# Patient Record
Sex: Female | Born: 1935 | Race: White | Hispanic: No | Smoking: Never smoker
Health system: Southern US, Community
[De-identification: ages and names within clinical notes are randomized; demographics above are authoritative.]

## PROBLEM LIST (undated history)

## (undated) DIAGNOSIS — E785 Hyperlipidemia, unspecified: Secondary | ICD-10-CM

## (undated) DIAGNOSIS — D509 Iron deficiency anemia, unspecified: Secondary | ICD-10-CM

## (undated) DIAGNOSIS — B029 Zoster without complications: Secondary | ICD-10-CM

## (undated) DIAGNOSIS — K589 Irritable bowel syndrome without diarrhea: Secondary | ICD-10-CM

## (undated) DIAGNOSIS — F419 Anxiety disorder, unspecified: Secondary | ICD-10-CM

## (undated) DIAGNOSIS — I10 Essential (primary) hypertension: Secondary | ICD-10-CM

## (undated) DIAGNOSIS — R55 Syncope and collapse: Secondary | ICD-10-CM

## (undated) DIAGNOSIS — I2510 Atherosclerotic heart disease of native coronary artery without angina pectoris: Secondary | ICD-10-CM

## (undated) DIAGNOSIS — E7800 Pure hypercholesterolemia, unspecified: Secondary | ICD-10-CM

## (undated) HISTORY — DX: Irritable bowel syndrome, unspecified: K58.9

## (undated) HISTORY — DX: Hyperlipidemia, unspecified: E78.5

## (undated) HISTORY — DX: Syncope and collapse: R55

## (undated) HISTORY — DX: Iron deficiency anemia, unspecified: D50.9

## (undated) HISTORY — DX: Essential (primary) hypertension: I10

## (undated) HISTORY — DX: Pure hypercholesterolemia, unspecified: E78.00

## (undated) HISTORY — DX: Anxiety disorder, unspecified: F41.9

---
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
## 1965-10-14 HISTORY — PX: OOPHORECTOMY: SHX86

## 1965-10-14 HISTORY — PX: APPENDECTOMY: SHX54

## 1994-10-14 HISTORY — PX: CORONARY ARTERY BYPASS GRAFT: SHX141

## 1998-01-11 ENCOUNTER — Other Ambulatory Visit: Admission: RE | Admit: 1998-01-11 | Discharge: 1998-01-11 | Payer: Self-pay | Admitting: *Deleted

## 1998-05-10 ENCOUNTER — Other Ambulatory Visit: Admission: RE | Admit: 1998-05-10 | Discharge: 1998-05-10 | Payer: Self-pay | Admitting: Cardiology

## 1999-01-12 ENCOUNTER — Other Ambulatory Visit: Admission: RE | Admit: 1999-01-12 | Discharge: 1999-01-12 | Payer: Self-pay | Admitting: *Deleted

## 1999-09-17 ENCOUNTER — Emergency Department (HOSPITAL_COMMUNITY): Admission: EM | Admit: 1999-09-17 | Discharge: 1999-09-17 | Payer: Self-pay

## 1999-11-15 ENCOUNTER — Encounter: Payer: Self-pay | Admitting: Gastroenterology

## 1999-11-15 ENCOUNTER — Encounter: Admission: RE | Admit: 1999-11-15 | Discharge: 1999-11-15 | Payer: Self-pay | Admitting: Gastroenterology

## 2000-03-05 ENCOUNTER — Other Ambulatory Visit: Admission: RE | Admit: 2000-03-05 | Discharge: 2000-03-05 | Payer: Self-pay | Admitting: *Deleted

## 2000-03-12 ENCOUNTER — Encounter (INDEPENDENT_AMBULATORY_CARE_PROVIDER_SITE_OTHER): Payer: Self-pay | Admitting: Specialist

## 2000-07-15 ENCOUNTER — Ambulatory Visit: Admission: RE | Admit: 2000-07-15 | Discharge: 2000-07-15 | Payer: Self-pay | Admitting: Gynecology

## 2000-07-21 ENCOUNTER — Encounter: Payer: Self-pay | Admitting: Gynecology

## 2000-07-21 ENCOUNTER — Ambulatory Visit (HOSPITAL_COMMUNITY): Admission: RE | Admit: 2000-07-21 | Discharge: 2000-07-21 | Payer: Self-pay | Admitting: Gynecology

## 2000-08-15 ENCOUNTER — Encounter: Payer: Self-pay | Admitting: Gynecology

## 2000-08-19 ENCOUNTER — Ambulatory Visit (HOSPITAL_COMMUNITY): Admission: RE | Admit: 2000-08-19 | Discharge: 2000-08-19 | Payer: Self-pay | Admitting: Gynecology

## 2000-08-19 ENCOUNTER — Encounter (INDEPENDENT_AMBULATORY_CARE_PROVIDER_SITE_OTHER): Payer: Self-pay

## 2000-09-24 ENCOUNTER — Ambulatory Visit: Admission: RE | Admit: 2000-09-24 | Discharge: 2000-09-24 | Payer: Self-pay | Admitting: Gynecology

## 2001-03-04 ENCOUNTER — Encounter: Payer: Self-pay | Admitting: Gastroenterology

## 2001-03-04 ENCOUNTER — Encounter: Admission: RE | Admit: 2001-03-04 | Discharge: 2001-03-04 | Payer: Self-pay | Admitting: Gastroenterology

## 2001-03-05 ENCOUNTER — Other Ambulatory Visit: Admission: RE | Admit: 2001-03-05 | Discharge: 2001-03-05 | Payer: Self-pay | Admitting: *Deleted

## 2002-04-15 ENCOUNTER — Other Ambulatory Visit: Admission: RE | Admit: 2002-04-15 | Discharge: 2002-04-15 | Payer: Self-pay | Admitting: *Deleted

## 2004-08-02 ENCOUNTER — Other Ambulatory Visit: Admission: RE | Admit: 2004-08-02 | Discharge: 2004-08-02 | Payer: Self-pay | Admitting: *Deleted

## 2004-08-03 ENCOUNTER — Emergency Department (HOSPITAL_COMMUNITY): Admission: EM | Admit: 2004-08-03 | Discharge: 2004-08-03 | Payer: Self-pay | Admitting: Emergency Medicine

## 2004-08-17 ENCOUNTER — Encounter: Admission: RE | Admit: 2004-08-17 | Discharge: 2004-08-17 | Payer: Self-pay | Admitting: Otolaryngology

## 2004-10-14 LAB — HM COLONOSCOPY: HM Colonoscopy: NORMAL

## 2004-12-04 ENCOUNTER — Ambulatory Visit (HOSPITAL_COMMUNITY): Admission: RE | Admit: 2004-12-04 | Discharge: 2004-12-04 | Payer: Self-pay | Admitting: Otolaryngology

## 2004-12-07 ENCOUNTER — Ambulatory Visit (HOSPITAL_COMMUNITY): Admission: RE | Admit: 2004-12-07 | Discharge: 2004-12-07 | Payer: Self-pay | Admitting: Otolaryngology

## 2004-12-07 ENCOUNTER — Encounter (INDEPENDENT_AMBULATORY_CARE_PROVIDER_SITE_OTHER): Payer: Self-pay | Admitting: *Deleted

## 2006-12-09 ENCOUNTER — Other Ambulatory Visit: Admission: RE | Admit: 2006-12-09 | Discharge: 2006-12-09 | Payer: Self-pay | Admitting: *Deleted

## 2007-10-15 HISTORY — PX: EYE SURGERY: SHX253

## 2010-02-06 ENCOUNTER — Encounter: Admission: RE | Admit: 2010-02-06 | Discharge: 2010-02-06 | Payer: Self-pay | Admitting: Cardiology

## 2010-02-09 ENCOUNTER — Inpatient Hospital Stay (HOSPITAL_COMMUNITY): Admission: RE | Admit: 2010-02-09 | Discharge: 2010-02-13 | Payer: Self-pay | Admitting: Cardiology

## 2010-02-09 HISTORY — PX: CARDIAC CATHETERIZATION: SHX172

## 2010-03-08 ENCOUNTER — Encounter (HOSPITAL_COMMUNITY): Admission: RE | Admit: 2010-03-08 | Discharge: 2010-06-06 | Payer: Self-pay | Admitting: Cardiology

## 2010-03-28 ENCOUNTER — Ambulatory Visit: Payer: Self-pay | Admitting: Cardiology

## 2010-03-28 ENCOUNTER — Inpatient Hospital Stay (HOSPITAL_COMMUNITY): Admission: EM | Admit: 2010-03-28 | Discharge: 2010-03-29 | Payer: Self-pay | Admitting: Emergency Medicine

## 2010-06-07 ENCOUNTER — Encounter (HOSPITAL_COMMUNITY): Admission: RE | Admit: 2010-06-07 | Discharge: 2010-06-15 | Payer: Self-pay | Admitting: Cardiology

## 2010-06-13 ENCOUNTER — Observation Stay (HOSPITAL_COMMUNITY): Admission: EM | Admit: 2010-06-13 | Discharge: 2010-06-14 | Payer: Self-pay | Admitting: Emergency Medicine

## 2010-06-13 ENCOUNTER — Ambulatory Visit: Payer: Self-pay | Admitting: Cardiology

## 2010-06-19 ENCOUNTER — Ambulatory Visit: Payer: Self-pay | Admitting: Cardiology

## 2010-06-27 ENCOUNTER — Ambulatory Visit: Payer: Self-pay | Admitting: Cardiology

## 2010-08-02 ENCOUNTER — Ambulatory Visit: Payer: Self-pay | Admitting: Cardiology

## 2010-08-03 ENCOUNTER — Ambulatory Visit: Payer: Self-pay | Admitting: Cardiology

## 2010-08-09 ENCOUNTER — Ambulatory Visit: Payer: Self-pay | Admitting: Cardiology

## 2010-09-24 ENCOUNTER — Ambulatory Visit: Payer: Self-pay | Admitting: Cardiology

## 2010-10-14 HISTORY — PX: SQUAMOUS CELL CARCINOMA EXCISION: SHX2433

## 2010-10-14 LAB — HM MAMMOGRAPHY: HM Mammogram: NORMAL

## 2010-10-16 ENCOUNTER — Ambulatory Visit: Payer: Self-pay | Admitting: Cardiology

## 2010-11-13 ENCOUNTER — Ambulatory Visit: Payer: Self-pay | Admitting: Cardiology

## 2010-12-20 ENCOUNTER — Other Ambulatory Visit: Payer: Self-pay | Admitting: Dermatology

## 2010-12-27 LAB — DIFFERENTIAL
Eosinophils Absolute: 0.3 10*3/uL (ref 0.0–0.7)
Lymphs Abs: 1.2 10*3/uL (ref 0.7–4.0)
Monocytes Relative: 13 % — ABNORMAL HIGH (ref 3–12)
Neutro Abs: 3.8 10*3/uL (ref 1.7–7.7)

## 2010-12-27 LAB — URINALYSIS, ROUTINE W REFLEX MICROSCOPIC
Glucose, UA: NEGATIVE mg/dL
Ketones, ur: NEGATIVE mg/dL
Protein, ur: NEGATIVE mg/dL
Specific Gravity, Urine: 1.014 (ref 1.005–1.030)
pH: 7.5 (ref 5.0–8.0)

## 2010-12-27 LAB — CBC
HCT: 33.3 % — ABNORMAL LOW (ref 36.0–46.0)
HCT: 33.4 % — ABNORMAL LOW (ref 36.0–46.0)
Hemoglobin: 10.2 g/dL — ABNORMAL LOW (ref 12.0–15.0)
Hemoglobin: 10.7 g/dL — ABNORMAL LOW (ref 12.0–15.0)
MCH: 27.1 pg (ref 26.0–34.0)
MCHC: 32.1 g/dL (ref 30.0–36.0)
MCV: 83.9 fL (ref 78.0–100.0)
Platelets: 229 10*3/uL (ref 150–400)
Platelets: 236 10*3/uL (ref 150–400)
RBC: 3.79 MIL/uL — ABNORMAL LOW (ref 3.87–5.11)
RBC: 3.98 MIL/uL (ref 3.87–5.11)
RDW: 13.5 % (ref 11.5–15.5)
WBC: 4.7 10*3/uL (ref 4.0–10.5)
WBC: 6 10*3/uL (ref 4.0–10.5)

## 2010-12-27 LAB — BASIC METABOLIC PANEL
BUN: 13 mg/dL (ref 6–23)
CO2: 29 mEq/L (ref 19–32)
Calcium: 8.1 mg/dL — ABNORMAL LOW (ref 8.4–10.5)
Chloride: 107 mEq/L (ref 96–112)
Creatinine, Ser: 0.54 mg/dL (ref 0.4–1.2)
Creatinine, Ser: 0.57 mg/dL (ref 0.4–1.2)
GFR calc Af Amer: >60 mL/min (ref >60)
GFR calc non Af Amer: >60 mL/min (ref >60)
Sodium: 132 mEq/L — ABNORMAL LOW (ref 135–145)
Sodium: 139 mEq/L (ref 135–145)

## 2010-12-27 LAB — CARDIAC PANEL(CRET KIN+CKTOT+MB+TROPI)
CK, MB: 1.4 ng/mL (ref 0.3–4.0)
Relative Index: INVALID (ref 0.0–2.5)
Relative Index: INVALID (ref 0.0–2.5)
Total CK: 44 U/L (ref 7–177)
Total CK: 46 U/L (ref 7–177)

## 2010-12-27 LAB — URINE CULTURE
Colony Count: NO GROWTH
Culture: NO GROWTH

## 2010-12-27 LAB — POCT I-STAT, CHEM 8
BUN: 15 mg/dL (ref 6–23)
Creatinine, Ser: 0.7 mg/dL (ref 0.4–1.2)
Hemoglobin: 12.9 g/dL (ref 12.0–15.0)
Potassium: 3.8 mEq/L (ref 3.5–5.1)
Sodium: 132 mEq/L — ABNORMAL LOW (ref 135–145)
TCO2: 29 mmol/L (ref 0–100)

## 2010-12-27 LAB — COMPREHENSIVE METABOLIC PANEL
AST: 22 U/L (ref 0–37)
BUN: 12 mg/dL (ref 6–23)
CO2: 31 mEq/L (ref 19–32)
Calcium: 8.2 mg/dL — ABNORMAL LOW (ref 8.4–10.5)
Creatinine, Ser: 0.7 mg/dL (ref 0.4–1.2)
Glucose, Bld: 123 mg/dL — ABNORMAL HIGH (ref 70–99)

## 2010-12-27 LAB — FOLATE: Folate: >20 ng/mL

## 2010-12-27 LAB — IRON AND TIBC
Saturation Ratios: 5 % — ABNORMAL LOW (ref 20–55)
UIBC: 378 ug/dL

## 2010-12-27 LAB — POCT CARDIAC MARKERS

## 2010-12-27 LAB — RETICULOCYTES: Retic Ct Pct: 1.7 % (ref 0.4–3.1)

## 2010-12-30 LAB — COMPREHENSIVE METABOLIC PANEL
ALT: 15 U/L (ref 0–35)
Albumin: 3.1 g/dL — ABNORMAL LOW (ref 3.5–5.2)
Alkaline Phosphatase: 41 U/L (ref 39–117)
BUN: 7 mg/dL (ref 6–23)
Chloride: 98 mEq/L (ref 96–112)
Glucose, Bld: 119 mg/dL — ABNORMAL HIGH (ref 70–99)
Potassium: 3.7 mEq/L (ref 3.5–5.1)
Sodium: 135 mEq/L (ref 135–145)
Total Bilirubin: 0.4 mg/dL (ref 0.3–1.2)
Total Protein: 5.1 g/dL — ABNORMAL LOW (ref 6.0–8.3)

## 2010-12-30 LAB — CARDIAC PANEL(CRET KIN+CKTOT+MB+TROPI)
CK, MB: 1.8 ng/mL (ref 0.3–4.0)
Relative Index: INVALID (ref 0.0–2.5)
Total CK: 52 U/L (ref 7–177)
Troponin I: 0.01 ng/mL (ref 0.00–0.06)

## 2010-12-30 LAB — CBC
HCT: 33.9 % — ABNORMAL LOW (ref 36.0–46.0)
HCT: 34.6 % — ABNORMAL LOW (ref 36.0–46.0)
Hemoglobin: 11.7 g/dL — ABNORMAL LOW (ref 12.0–15.0)
MCHC: 33.6 g/dL (ref 30.0–36.0)
MCHC: 34 g/dL (ref 30.0–36.0)
MCV: 91.8 fL (ref 78.0–100.0)
MCV: 92.1 fL (ref 78.0–100.0)
RBC: 3.68 MIL/uL — ABNORMAL LOW (ref 3.87–5.11)
RBC: 3.78 MIL/uL — ABNORMAL LOW (ref 3.87–5.11)
WBC: 4.7 10*3/uL (ref 4.0–10.5)
WBC: 5 10*3/uL (ref 4.0–10.5)

## 2010-12-30 LAB — BASIC METABOLIC PANEL
BUN: 5 mg/dL — ABNORMAL LOW (ref 6–23)
Chloride: 100 mEq/L (ref 96–112)
GFR calc non Af Amer: >60 mL/min (ref >60)
Glucose, Bld: 103 mg/dL — ABNORMAL HIGH (ref 70–99)
Potassium: 3.7 mEq/L (ref 3.5–5.1)
Sodium: 133 mEq/L — ABNORMAL LOW (ref 135–145)

## 2010-12-30 LAB — HEPARIN LEVEL (UNFRACTIONATED): Heparin Unfractionated: 0.43 IU/mL (ref 0.30–0.70)

## 2010-12-31 LAB — POCT I-STAT, CHEM 8
Calcium, Ion: 1.02 mmol/L — ABNORMAL LOW (ref 1.12–1.32)
Chloride: 97 mEq/L (ref 96–112)
Glucose, Bld: 112 mg/dL — ABNORMAL HIGH (ref 70–99)
HCT: 36 % (ref 36.0–46.0)
Hemoglobin: 12.2 g/dL (ref 12.0–15.0)
TCO2: 26 mmol/L (ref 0–100)

## 2010-12-31 LAB — CBC
HCT: 34.8 % — ABNORMAL LOW (ref 36.0–46.0)
MCV: 92.2 fL (ref 78.0–100.0)
Platelets: 176 10*3/uL (ref 150–400)
RDW: 12.6 % (ref 11.5–15.5)

## 2010-12-31 LAB — DIFFERENTIAL
Basophils Absolute: 0 10*3/uL (ref 0.0–0.1)
Basophils Relative: 1 % (ref 0–1)
Eosinophils Absolute: 0.2 10*3/uL (ref 0.0–0.7)
Eosinophils Relative: 3 % (ref 0–5)
Neutrophils Relative %: 67 % (ref 43–77)

## 2010-12-31 LAB — PROTIME-INR: Prothrombin Time: 13 seconds (ref 11.6–15.2)

## 2010-12-31 LAB — POCT CARDIAC MARKERS: Troponin i, poc: <0.05 ng/mL (ref 0.00–0.09)

## 2011-01-01 LAB — BASIC METABOLIC PANEL
BUN: 9 mg/dL (ref 6–23)
BUN: 8 mg/dL (ref 6–23)
Creatinine, Ser: 0.63 mg/dL (ref 0.4–1.2)
GFR calc non Af Amer: >60 mL/min (ref >60)
GFR calc non Af Amer: >60 mL/min (ref >60)
Glucose, Bld: 100 mg/dL — ABNORMAL HIGH (ref 70–99)
Potassium: 4 mEq/L (ref 3.5–5.1)
Potassium: 4.3 mEq/L (ref 3.5–5.1)

## 2011-01-01 LAB — CBC
HCT: 34.1 % — ABNORMAL LOW (ref 36.0–46.0)
HCT: 35.1 % — ABNORMAL LOW (ref 36.0–46.0)
Hemoglobin: 11.9 g/dL — ABNORMAL LOW (ref 12.0–15.0)
MCV: 94.5 fL (ref 78.0–100.0)
MCV: 94.8 fL (ref 78.0–100.0)
Platelets: 165 10*3/uL (ref 150–400)
Platelets: 161 10*3/uL (ref 150–400)
RDW: 12.4 % (ref 11.5–15.5)
RDW: 12.6 % (ref 11.5–15.5)
WBC: 5.8 10*3/uL (ref 4.0–10.5)

## 2011-01-01 LAB — PROTIME-INR
INR: 0.99 (ref 0.00–1.49)
Prothrombin Time: 13 seconds (ref 11.6–15.2)

## 2011-01-03 ENCOUNTER — Telehealth: Payer: Self-pay | Admitting: *Deleted

## 2011-01-03 NOTE — Telephone Encounter (Signed)
PATIENT PHONED C/O A LOT OF BELCHING, CHEST DISCOMFORT, DIZZY FEELING YESTERDAY LIKE SHE WAS "GOING TO FLY OUT OF A FERRIS WHEEL".  TOOK A NTG WITH NO RELIEF.  NORMAL CATH April 2011.  ADVISED TO REST FOR THE DAY AND TRY SOME ANTACIDS.  CALL BACK IN AM IF NO BETTER. ADVISE TO CALL 911 IF NAUSEA, ARM PAIN, OR SOB.

## 2011-01-04 NOTE — Telephone Encounter (Signed)
Agree 

## 2011-01-14 ENCOUNTER — Other Ambulatory Visit: Payer: Self-pay | Admitting: Cardiology

## 2011-01-14 DIAGNOSIS — M62830 Muscle spasm of back: Secondary | ICD-10-CM

## 2011-01-16 NOTE — Telephone Encounter (Signed)
I do prescribe this

## 2011-02-02 ENCOUNTER — Encounter: Payer: Self-pay | Admitting: Cardiology

## 2011-02-04 ENCOUNTER — Encounter: Payer: Self-pay | Admitting: Cardiology

## 2011-02-04 DIAGNOSIS — R5383 Other fatigue: Secondary | ICD-10-CM | POA: Insufficient documentation

## 2011-02-04 DIAGNOSIS — D509 Iron deficiency anemia, unspecified: Secondary | ICD-10-CM | POA: Insufficient documentation

## 2011-02-04 DIAGNOSIS — R9439 Abnormal result of other cardiovascular function study: Secondary | ICD-10-CM | POA: Insufficient documentation

## 2011-02-04 DIAGNOSIS — I10 Essential (primary) hypertension: Secondary | ICD-10-CM | POA: Insufficient documentation

## 2011-02-04 DIAGNOSIS — E785 Hyperlipidemia, unspecified: Secondary | ICD-10-CM | POA: Insufficient documentation

## 2011-02-04 DIAGNOSIS — K589 Irritable bowel syndrome without diarrhea: Secondary | ICD-10-CM | POA: Insufficient documentation

## 2011-02-04 DIAGNOSIS — F419 Anxiety disorder, unspecified: Secondary | ICD-10-CM | POA: Insufficient documentation

## 2011-02-04 DIAGNOSIS — I259 Chronic ischemic heart disease, unspecified: Secondary | ICD-10-CM | POA: Insufficient documentation

## 2011-02-04 DIAGNOSIS — R55 Syncope and collapse: Secondary | ICD-10-CM | POA: Insufficient documentation

## 2011-02-05 ENCOUNTER — Ambulatory Visit: Payer: Self-pay | Admitting: Cardiology

## 2011-02-11 ENCOUNTER — Ambulatory Visit (INDEPENDENT_AMBULATORY_CARE_PROVIDER_SITE_OTHER): Payer: Medicare Other | Admitting: Cardiology

## 2011-02-11 ENCOUNTER — Encounter: Payer: Self-pay | Admitting: Cardiology

## 2011-02-11 DIAGNOSIS — M797 Fibromyalgia: Secondary | ICD-10-CM

## 2011-02-11 DIAGNOSIS — Z951 Presence of aortocoronary bypass graft: Secondary | ICD-10-CM | POA: Insufficient documentation

## 2011-02-11 DIAGNOSIS — IMO0001 Reserved for inherently not codable concepts without codable children: Secondary | ICD-10-CM

## 2011-02-11 DIAGNOSIS — E7800 Pure hypercholesterolemia, unspecified: Secondary | ICD-10-CM

## 2011-02-11 DIAGNOSIS — Z9889 Other specified postprocedural states: Secondary | ICD-10-CM

## 2011-02-11 NOTE — Assessment & Plan Note (Addendum)
The patient has a remote history of fibromyalgia which over the course of many years appeared to have resolved.  Now she wonders if the fibromyalgia may be coming back again.  She complains of marked fatigue as well as generalized muscle aching and soreness.  She isNot able to do much walking because of the exhaustion.  She was asking about possible rheumatology referral.  She does have an appointment to see her new primary care provider Dr. Caryl Never within the next week or so and I asked her to discuss that with him.  We talked about possible empiric use of some of the newer drugs such as Lyrica which she had heard about but we did not start her on anything today.

## 2011-02-11 NOTE — Assessment & Plan Note (Signed)
The patient has not been experiencing any recurrent angina pectoris. 

## 2011-02-11 NOTE — Progress Notes (Signed)
Sabrina Moore Date of Birth:  02-26-1927 [facility] [address]   [address] [place], [place]  [address] [phone]           Fax   [phone]  History of Present Illness: This 77 year old woman is seen for a scheduled 3 month followup office visit.  She has a history of known ischemic heart disease.  She had coronary artery bypass graft surgery in 1996.  He had a abnormal treadmill Cardiolite stress test on 02/01/10 and had subsequent cardiac catheterization by Dr. Deborah Chalk and underwent PCI on 02/12/10 at which time she had successful stenting of the saphenous vein graft to the diagonal vessel which was also followed by successful stenting of the saphenous vein graft to the intermediate vessel.  Shortly after going home from that procedure she was readmitted to  in June 2011 for chest pain and she ruled out for myocardial infarction and did not require subsequent catheterization.  2 months later in August 2011 she had an episode of syncope at the cardiac rehabilitation program and was hospitalized.  Workup there was unremarkable.  She did seem to respond to low dose antianxiety medication in the form of Xanax.  She has a past history of suspected mitral valve prolapse and her last echocardiogram 03/18/05 showed sclerotic mitral valve leaflets with mild mitral valve prolapse and mild mitral regurgitation.  Patient has a past history of iron deficiency anemia and is followed by Dr. Dorena Cookey for GI.  Current Outpatient Prescriptions  Medication Sig Dispense Refill  . ALPHA LIPOIC ACID PO Take by mouth daily.        Marland Kitchen ALPRAZolam (XANAX) 0.25 MG tablet Take 0.25 mg by mouth 2 (two) times daily.        Marland Kitchen aspirin 81 MG tablet Take 81 mg by mouth daily.        . Calcium Carbonate-Vit D-Min (CALCIUM 1200 PO) Take by mouth daily.        . carisoprodol (SOMA) 350 MG tablet TAKE 1 TABLET BY MOUTH EVERY 6 HOURS AS NEEDED FOR BACK SPASM  30 tablet  2  . clopidogrel  (PLAVIX) 75 MG tablet Take 75 mg by mouth daily.        . Coenzyme Q10 (COQ10 PO) Take by mouth daily.        . Cyanocobalamin (B-12 PO) Take by mouth daily.        . Digestive Enzymes (ENZYME DIGEST PO) Take by mouth daily.        . ferrous sulfate 325 (65 FE) MG tablet Take 325 mg by mouth daily with breakfast.        . MAGNESIUM CITRATE PO Take by mouth daily.        . metoprolol succinate (TOPROL-XL) 25 MG 24 hr tablet Take 12.5 mg by mouth daily.        . mometasone (NASONEX) 50 MCG/ACT nasal spray 2 sprays by Nasal route as needed.        . Multiple Vitamin (MULTIVITAMIN) tablet Take 1 tablet by mouth daily.        . NYSTATIN PO Take by mouth. 5cc qid       . Omega-3 Fatty Acids (FISH OIL PO) Take by mouth daily.        Marland Kitchen POTASSIUM & SODIUM PHOSPHATES PO Take 45 mg by mouth daily.        . Probiotic Product (PROBIOTIC PO) Take by mouth daily.        Marland Kitchen  Psyllium (METAMUCIL FIBER SINGLES PO) Take by mouth daily.        . Pyridoxine HCl (B-6 PO) Take by mouth daily.          Allergies  Allergen Reactions  . Crestor (Rosuvastatin Calcium)   . Lescol     Gi symptoms  . Lipitor (Atorvastatin Calcium)   . Lovastatin   . Pravachol   . Zocor (Simvastatin)     Patient Active Problem List  Diagnoses  . IHD (ischemic heart disease)  . Chest discomfort  . Abnormal stress ECG with treadmill  . Dyslipidemia  . Vasovagal syncope  . IDA (iron deficiency anemia)  . IBS (irritable bowel syndrome)  . Fatigue  . Anxiety  . Hypertension  . Hypercholesterolemia  . Hx of CABG  . Fibromyalgia    History  Smoking status  . Never Smoker   Smokeless tobacco  . Not on file    History  Alcohol Use No    Family History  Problem Relation Age of Onset  . Breast cancer Mother   . Ovarian cancer Mother   . Heart disease Father   . Arthritis Father     Review of Systems: Constitutional: no fever chills diaphoresis or fatigue or change in weight.  Head and neck: no hearing loss, no  epistaxis, no photophobia or visual disturbance. Respiratory: No cough, shortness of breath or wheezing. Cardiovascular: No chest pain peripheral edema, palpitations. Gastrointestinal: No abdominal distention, no abdominal pain, no change in bowel habits hematochezia or melena. Genitourinary: No dysuria, no frequency, no urgency, no nocturia. Musculoskeletal:No arthralgias, no back pain, no gait disturbance . Neurological: No dizziness, no headaches, no numbness, no seizures, no syncope, no weakness, no tremors. Hematologic: No lymphadenopathy, no easy bruising. Psychiatric: No confusion, no hallucinations, no sleep disturbance.    Physical Exam: Filed Vitals:   02/11/11 1440  BP: 120/52  Pulse: 68  The general appearance reveals a very pleasant elderly woman in no acute distress.Pupils equal and reactive.   Extraocular Movements are full.  There is no scleral icterus.  The mouth and pharynx are normal.  The neck is supple.  The carotids reveal no bruits.  The jugular venous pressure is normal.  The thyroid is not enlarged.  There is no lymphadenopathy.The chest is clear to percussion and auscultation. There are no rales or rhonchi. Expansion of the chest is symmetrical.The precordium is quiet.  The first heart sound is normal.  The second heart sound is physiologically split.  There is no murmur gallop rub or click.  There is no abnormal lift or heave.The abdomen is soft and nontender. Bowel sounds are normal. The liver and spleen are not enlarged. There Are no abdominal masses. There are no bruits.The pedal pulses are good.  There is no phlebitis .There is trace bilateral ankle edema worse on the left.  There is no cyanosis or clubbing.Strength is normal and symmetrical in all extremities.  There is no lateralizing weakness.  There are no sensory deficits.The skin is warm and dry.  There is no rash.   Assessment / Plan: The patient is to continue her present medication.  She does feel that she  is slowly getting better and her strength is gradually improving.  We will not make any medication changes at this time.  She rechecked in 3 months for followup office visit and fasting lab work.

## 2011-02-11 NOTE — Assessment & Plan Note (Addendum)
The patient is trying to manage her cholesterol withDiet alone since she does not tolerate statins.  She has attributed some of her myalgias and possibly her fibromyalgia to previous statin use.  We talked about the fact that there really is not a blood test for fibromyalgia so there is no way to know for sure if she has a recurrence of it or not.

## 2011-02-12 ENCOUNTER — Ambulatory Visit (INDEPENDENT_AMBULATORY_CARE_PROVIDER_SITE_OTHER): Payer: Medicare Other | Admitting: Family Medicine

## 2011-02-12 ENCOUNTER — Encounter: Payer: Self-pay | Admitting: Family Medicine

## 2011-02-12 VITALS — BP 120/60 | HR 80 | Temp 98.3°F | Resp 12 | Ht 60.5 in | Wt 116.0 lb

## 2011-02-12 DIAGNOSIS — M791 Myalgia, unspecified site: Secondary | ICD-10-CM

## 2011-02-12 DIAGNOSIS — D649 Anemia, unspecified: Secondary | ICD-10-CM | POA: Insufficient documentation

## 2011-02-12 DIAGNOSIS — IMO0001 Reserved for inherently not codable concepts without codable children: Secondary | ICD-10-CM

## 2011-02-12 DIAGNOSIS — I259 Chronic ischemic heart disease, unspecified: Secondary | ICD-10-CM

## 2011-02-12 DIAGNOSIS — E785 Hyperlipidemia, unspecified: Secondary | ICD-10-CM

## 2011-02-12 NOTE — Progress Notes (Signed)
  Subjective:    Patient ID: Sabrina Moore, female    DOB: [birth year], 77 y.o.   MRN: [id number]  HPI New patient to establish care. Patient has history of ischemic heart disease, dyslipidemia, questionable irritable bowel syndrome, chronic fatigue, hypertension and question of fibromyalgia. She had coronary artery bypass graft 1996 and had reportedly couple of stents last summer. Medications are reviewed. She gives history that she had onset last fall of recurrent myalgias and arthralgias mostly in hips and back with some arm involvement as well. Increased fatigue. Symptoms have continued since that time. Recent lab work per gynecologist including thyroid functions and vitamin D level were normal.  ?History of fibromyalgia.   She also has history of chronic normocytic anemia with normal iron studies last summer. Not clear if polymyalgia rheumatica has been ruled out. No record of recent sedimentation rate. She has taken muscle relaxer and seen chiropractor without much relief of her muscle discomfort. Previous intolerance to multiple statins and currently not using any statins. Denies recent chest pain.   Review of Systems  Constitutional: Positive for fatigue. Negative for fever, chills, activity change, appetite change and unexpected weight change.  HENT: Negative for trouble swallowing.   Eyes: Negative for visual disturbance.  Respiratory: Negative for cough, shortness of breath and wheezing.   Cardiovascular: Negative for chest pain, palpitations and leg swelling.  Gastrointestinal: Negative for abdominal pain and blood in stool.  Musculoskeletal: Positive for myalgias and back pain. Negative for joint swelling.  Skin: Negative for rash.  Neurological: Positive for weakness. Negative for syncope and headaches.       Weakness is generalized.  Hematological: Negative for adenopathy.  Psychiatric/Behavioral: Negative for dysphoric mood.       Objective:   Physical Exam  Constitutional:  She is oriented to person, place, and time. She appears well-developed and well-nourished. No distress.  HENT:  Mouth/Throat: Oropharynx is clear and moist.  Neck: No thyromegaly present.  Cardiovascular: Normal rate, regular rhythm and normal heart sounds.   No murmur heard. Pulmonary/Chest: Effort normal and breath sounds normal. No respiratory distress. She has no wheezes.  Musculoskeletal: She exhibits no edema.  Lymphadenopathy:    She has no cervical adenopathy.  Neurological: She is alert and oriented to person, place, and time. No cranial nerve deficit. She exhibits normal muscle tone.          Assessment & Plan:  #1 myalgias and arthralgias. Rule out polymyalgia rheumatica. Obtain sed rate. If elevated consider low-dose prednisone.  If normal suspect possible fibromyalgia related. #2 ischemic heart disease. No recent chest pains #3 hyperlipidemia. Patient intolerant to basically all statins. #4 history of normocytic anemia which is chronic

## 2011-02-12 NOTE — Patient Instructions (Signed)
Polymyalgia Rheumatica Polymyalgia rheumatica (also called PMR or polymyalgia) is a rheumatologic (arthritic) condition that causes pain and morning stiffness in your neck, shoulders, and hips. It is an inflammatory condition. In some people, inflammation of certain structures in the shoulder, hips, or other joints can be seen on special testing. It does not cause joint destruction, as occurs in other arthritic conditions. It usually occurs after 75 years of age, and is more common as you age. It can be confused with several other diseases, but it is usually easily treated. People with PMR often have, or can develop, a more severe rheumatologic condition called giant cell arteritis (also called CGA or temporal arteritis).  CAUSES The exact cause of PMR is not known.   There are genetic factors involved.   Viruses have been suspected in the cause of PMR. This has not been proven.  SYMPTOMS  Aching, pain, and morning stiffness your neck, both shoulders, or both hips.   Symptoms usually start slowly and build gradually.   Morning stiffness usually lasts at least 30 minutes.   Swelling and tenderness in other joints of the arms, hands, legs, and feet may occur.   Swelling and inflammation in the wrists can cause nerve inflammation at the wrist (carpal tunnel syndrome).   You may also have low grade fever, fatigue, weakness, decreased appetite and weight loss.  DIAGNOSIS  Your caregiver may suspect that you have PMR based on your description of your symptoms and on your exam.   Your caregiver will examine you to be sure you do not have diseases that can be confused with PMR. These diseases include rheumatoid arthritis, fibromyalgia, or thyroid disease.   Your caregiver should check for signs of giant cell arteritis. This can cause serious complications such as blindness.   Lab tests can help confirm that you have PMR and not other diseases, but are sometimes inconclusive.   X-rays cannot  show PMR. However, it can identify other diseases like rheumatoid arthritis. Your caregiver may have you see a specialist in arthritis and inflammatory diseases (rheumatologist).  TREATMENT The goal of treatment is relief of symptoms. Treatment does not shorten the course of the illness or prevent complications. With proper treatment, you usually feel better almost right away.   The initial treatment of PMR is usually cortisone (steroid) medication, such as prednisone or prednisolone. Your caregiver will help determine a starting dose, which is usually a low to moderate dose. The dose is gradually reduced every few weeks to months. Treatment usually lasts one to three years.   Other stronger medications are rarely needed. They will only be prescribed if your symptoms do not get better on cortisone medication alone, or if they recur as the dose is reduced.   Cortisone medication can have different side effects. With the doses of cortisone needed for PMR, the side effects are usually mild. Discuss this with your caregiver.   Your caregiver will evaluate you regularly during your treatment. They will do this in order to assess progress and to check for complications of the illness or treatment.   Physical therapy is sometimes useful. This is especially true if your joints are still stiff after other symptoms have improved.  HOME CARE INSTRUCTIONS  Follow your caregiver's instructions. Do not change your dose of cortisone medication on your own.   Keep your appointments for follow-up lab tests and caregiver visits. Your lab tests need to be monitored. You must get checked periodically for giant cell arteritis.   Follow  your caregiver's guidance regarding physical activity (usually no restrictions are needed) or physical therapy.   Your caregiver may have instructions to prevent or check for side effects from cortisone medication (including bone density testing or treatment). Follow their  instructions carefully.  SEEK MEDICAL CARE IF:  You develop any side effects from treatment. Side effects can include:   Elevated blood pressure.   High blood sugar (or worsening of diabetes, if you are diabetic).   Difficulty fighting off infections.   Weight gain.   Weakness of the bones (osteoporosis).   Your aches, pains, morning stiffness, or other symptoms get worse with time. This is especially true after your dose of cortisone is reduced.   You develop new joint symptoms (pain, swelling, etc.)  SEEK IMMEDIATE MEDICAL CARE IF:  You develop a severe headache.   You start vomiting.   You have problems with your vision.   You develop an unexplained oral temperature above 100.5 F (38.1 C).  Document Released: 11/07/2004 Document Re-Released: 07/27/2009 [facility] Patient Information 2011 [place], [place].

## 2011-02-14 ENCOUNTER — Ambulatory Visit: Payer: Medicare Other | Admitting: Family Medicine

## 2011-02-14 DIAGNOSIS — Z23 Encounter for immunization: Secondary | ICD-10-CM

## 2011-02-14 DIAGNOSIS — Z299 Encounter for prophylactic measures, unspecified: Secondary | ICD-10-CM

## 2011-02-14 MED ORDER — PNEUMOCOCCAL VAC POLYVALENT 25 MCG/0.5ML IJ INJ: 0.5000 mL | INJECTION | Freq: Once | INTRAMUSCULAR | Status: DC

## 2011-02-14 NOTE — Progress Notes (Signed)
Quick Note:  Pt informed ______ 

## 2011-03-01 NOTE — Procedures (Signed)
[facility]  Patient:    Sabrina Moore, Sabrina Moore                        MRN: [id number] Proc. Date: 03/12/00 Adm. Date:  [id number] Attending:  Louie Bun CC:         Clovis Pu [name], M.D.                           Procedure Report  PROCEDURE:  Colonoscopy with biopsy.  INDICATION FOR PROCEDURE:  Chronic diarrhea and a family history of colon cancer, due for colonoscopy.  DESCRIPTION OF PROCEDURE:  The patient was placed in the left lateral decubitus position and placed on the pulse monitor with continuous low-flow oxygen delivered by nasal cannula.  She was sedated with 1 mg of IV Versed in addition to the 50 mg of IV Demerol and 4 mg of IV Versed given for the previous EGD.  The Olympus video colonoscope was inserted into the rectum and advanced to the cecum, confirmed by intubation of the ileal orifice and visualization of the appendiceal orifice.  The prep was good.  The terminal ileum was explored for several centimeters and appeared normal.  The cecum, ascending, transverse, descending, and sigmoid colon appeared normal with no masses, polyps, diverticula, or other mucosal abnormalities.  The rectum likewise appeared normal.  Retroflexed view of the anus did reveal some small internal internal hemorrhoids.  Biopsies were taken of the sigmoid and rectum to rule out cholanginous or microscopic colitis.  The scope was then withdrawn and the patient returned to the recovery room in stable condition.  She tolerated the procedure well and there were no immediate complications.  IMPRESSION:  Normal colonoscopy, including terminal ileum with the exception of small internal hemorrhoids.  PLAN:  Await biopsies of the rectum, sigmoid, and small intestine taken on previous EGD, as well as CLOtest for further work-up of her nausea and diarrhea.  Repeat colonoscopy in five years based on her family history. DD:  03/12/00 TD:  03/13/00 Job:  [id number]

## 2011-03-01 NOTE — Consult Note (Signed)
[facility]  Patient:    Sabrina Moore, Sabrina Moore                        MRN: [id number] Proc. Date: 09/24/00 Adm. Date:  [id number] Disc. Date: [id number] Attending:  Jeannette Corpus CC:         Clovis Pu [name], M.D.  Andres Ege, M.D.  Telford Nab, R.N.   Consultation Report  HISTORY:  The patient returns today for a postoperative followup, having undergone a diagnostic laparoscopy and peritoneal washings on August 19, 2000.  No abnormalities were found and washings were negative.  The patient reports that she has done well since surgery.  Her nausea has apparently resolved and she feels much better.  She has no GI or GU symptoms and her appetite is good.  PHYSICAL EXAMINATION  VITAL SIGNS:  Weight 118-1/2 pounds (stable).  Blood pressure 128/68.  ABDOMEN:  Soft and nontender.  Her laparoscopic incisions are healing beautifully.  PELVIC:  EG/BUS normal.  Vagina is clean.  Cervix is normal.  Bimanual reveals no masses and normal uterus.  There is no significant evidence of a cystocele.  IMPRESSION:  Nausea and weight loss, questionable etiology.  We have now excluded the possibility of primary peritoneal cancer or ovarian cancer and the patient does seem much improved.  She does note some urge incontinence and therefore is given a prescription for Detrol 2 mg b.i.d.  She will return to the care of Dr. Maisie Fus A. Brackbill and Dr. Andres Ege. DD:  09/24/00 TD:  09/24/00 Job: [id number]

## 2011-03-01 NOTE — Op Note (Signed)
Sabrina Moore, Sabrina Moore                 ACCOUNT NO.:  [id number]   MEDICAL RECORD NO.:  [id number]          PATIENT TYPE:  OIB   LOCATION:  5731                         FACILITY:  [facility]   PHYSICIAN:  Kinnie Scales. Annalee Genta, M.D.DATE OF BIRTH:  Jan 16, 1927   DATE OF PROCEDURE:  12/07/2004  DATE OF DISCHARGE:                                 OPERATIVE REPORT   PREOPERATIVE DIAGNOSIS:  1.  Chronic sinusitis.  2.  Chronic nasal polyposis.  3.  Nasal septal deviation.  4.  Bilateral turbinate hypertrophy.   POSTOPERATIVE DIAGNOSIS:  1.  Chronic sinusitis.  2.  Chronic nasal polyposis.  3.  Nasal septal deviation.  4.  Bilateral turbinate hypertrophy.   PROCEDURE:  1.  Bilateral endoscopic sinus surgery with Stealth assisted navigation      consisting of bilateral total ethmoidectomy, bilateral maxillary      antrostomy with removal of diseased tissue, bilateral sphenoidotomy with      removal of diseased tissue, and bilateral nasal frontal recess      exploration.  2.  Nasal septoplasty.  3.  Bilateral inferior turbinate reduction.   ANESTHESIA:  General endotracheal anesthesia.   SURGEON:  Kinnie Scales. Annalee Genta, M.D.   ESTIMATED BLOOD LOSS:  Approximately 200 mL.   COMPLICATIONS:  None.   DISPOSITION:  The patient is transferred from the operating room to the  recovery room in stable condition, there were no complications.   BRIEF HISTORY:  Ms. Bayless is a 77 year old white female who has been  followed with long-standing history of chronic sinusitis and chronic nasal  polyposis with nasal airway obstruction, chronic periorbital headaches, and  infection.  She has a history of inflammatory disease and allergies.  She  has been treated with multiple courses of antibiotic therapy, steroids oral  and topical, as well as antihistamines and allergy therapy and despite her  aggressive medical therapy, the patient has continued to have persistent  nasal airway obstruction and chronic  sinusitis.  Throughout the patient's  treatment, CT scans were obtained and they showed chronic and progressive  nasal polyposis with complete opacification of the sphenoid, maxillary,  ethmoid, and frontal sinuses.  The patient also had a left nasal septal  deviation with partial obstruction and bilateral inferior turbinate  hypertrophy.  Given the patient's history, examination, and findings, I  recommended that we consider her for bilateral endoscopic sinus surgery,  endoscopic nasal septoplasty, and turbinate reduction.  Prior to surgery, a  CT scan was obtained with computer assisted navigation (Stealth system) for  anatomic localization throughout the surgical procedure and the patient's  cardiologist, [name], cleared her for general anesthesia.  The risks,  benefits, and possible complications of the surgical procedures were  discussed in detail with the patient and her husband and they understood and  concurred with our plan for surgery which was scheduled as above.   SURGICAL PROCEDURE:  The patient was brought to the operating room on  December 07, 2004, and placed in a supine position on the operating table.  General endotracheal anesthesia was established without difficulty and the  patient was  adequately anesthetized and her nose was injected with a total  of 12 mL of 1% lidocaine with 1:100,000 epinephrine injected in a submucosal  fashion along the lateral nasal wall, uncinate process, middle turbinate,  inferior turbinate, and septum bilaterally.  The patient's nose was then  packed with Afrin soaked cottonoid pledgets.  She was prepped and draped in  a sterile fashion and the Stealth Computer assisted navigation system was  affixed, anatomic and surgical landmarks were identified and confirmed prior  to surgical intervention.   The surgical procedure was begun with bilateral nasal endoscopy using the 0  degree nasal telescope.  The patient's nasal cavity was  examined.  She had a  left nasal septal deviation with septal spurring and lateralization of the  middle turbinate with partial obstruction of the left nasal cavity.  She had  polypoid disease within the middle meatus and posterior sphenoid ethmoid  recess, bilaterally.  The surgical procedure was begun on the patient's  right hand side.  The middle turbinate was gently mobilized medially and the  ethmoid bulla was identified.  The uncinate process was reflected medially  and resected with through cutting back biting forceps.  The entire uncinate  process was then resected with through cutting forceps and a microdebrider.  Using a 0 degree telescope, dissection was carried out from anterior to  posterior through the floor of the ethmoid region to the inferior aspect of  the ethmoid region where heavy polypoid disease and bony septation was  resected.  The posterior ethmoid air cells were identified and the roof of  the ethmoid sinus was confirmed with the Stealth system.  The dissection was  carried from posterior to anterior under direct visualization with a 30  degree telescope and a curved microdebrider removing bony septations and  diseased mucosa.  The lateral nasal wall was palpated, the natural ostium of  the right maxillary sinus was identified, it was completely occluded with  polyps and disease and using a microdebrider and through cutting forceps,  the ostium was enlarged in an anterior, inferior, and posterior direction.  There was thick mucopurulent material within the right maxillary sinus and  this was sent for culture, sensitivity, and allergic fungal mucin samples.  With the Stealth system, the roof of the ethmoid sinus was explored.  The  anterior aspect in the nasal frontal recess was identified.  Using a through  cutting forceps and curved microdebrider, polyp disease within the nasal  frontal recess was removed and the nasal frontal recess was opened widely in order to  allow adequate drainage.  There was thick mucous within that sinus  and extensive polyps surrounding the natural ostium which were cleared.  Attention was turned to the sphenoid sinus.  With a straight Fraser suction  and Stealth localization, the natural ostium of the sphenoid sinus on the  right hand side was identified.  It was over run with polyp disease.  This  was resected at the opening and the natural opening of the sphenoid sinus  was enlarged in a lateral and inferior direction.  Thick mucopurulent  material was then aspirated from the sinus and polypoid disease was removed  from within the right sphenoid sinus.   Endoscopic nasal septoplasty was then performed.  The patient had a  significant septal spur in the mid and superior aspect of the left nasal  cavity.  A curvilinear incision was created approximately 3 cm from the  anterior nasal vestibule using a #15 scalpel.  Using  suction Cottle  elevator, overlying mucous membrane was elevated.  The bony cartilaginous  junction was crossed at the midline and mucoperiosteal flap was elevated  along the patient's right hand side.  Intervening bone with deviated bone  and cartilage was then resected using through cutting forceps, mobilizing  the septum to the  midline and creating a widely patent nasal cavity on the  left hand side.   The patient's left endoscopic sinus surgery was then undertaken. The middle  turbinate was medialized.  The uncinate process was reflected medially and  resected in its entirety with through cutting forceps and a microdebrider.  Dissection was then carried from posterior to anterior along the floor of  the ethmoid after resecting the ethmoid bulla, removing polypoid disease and  bony septations to create a widely patent anterior ethmoid region.  The  posterior air cells were identified.  The 30 degree telescope was used along  the roof of the ethmoid sinus and dissected from posterior to anterior   removing bony septations and thick mucopurulent and polypoid disease.  The  nasal frontal recess was identified.  Again, it was completely occluded with  polyps and a curved microdebrider under direct visualization with Stealth  guidance was used to enlarge the nasal frontal recess and remove polypoid  disease.  The lateral nasal wall was then explored.  The natural ostium of  the maxillary sinus on the left hand side was identified.  This was  completely obstructed.  Bone and soft tissue were resected with through  cutting forceps creating a widely patent maxillary sinus and a large left  maxillary sinus mucous retention cyst.  This was identified and resected.  The posterior aspect of the ethmoid sinus and base of the sphenoid sinus was  then explored with the Stealth and the natural ostium was identified.  This  was entered by removing the inferior aspect of the superior turbinate.  The  polypoid disease was resected with the microdebrider and the natural ostium of the sphenoid sinus was identified.  This was enlarged in an inferior and  lateral direction creating a widely patent sphenoid ostium.  Diseased polyps  within the sigmoid sinus were gently debrided and thick mucopurulent  material was irrigated and aspirated from the left sphenoid sinus.   Attention was turned to the inferior turbinates where bilateral inferior  turbinate intramural cautery was performed with the cautery set at 12 watts.  Two passes were made in a submucosal fashion of each inferior turbinate.  The inferior turbinates were then out fractured.  The patient's nasal cavity  and nasopharynx were then irrigated and suctioned.  With the 0 degree scope,  the surgical site was thoroughly examined and surgical debris was resected.  A 50/50 mix of Kenalog 40 and Bactroban cream was then instilled within the  frontal ethmoid, maxillary, and sphenoid sinuses in order to reduce  inflammation and risks of infection and  bilateral Kennedy sinus packs were  placed into the common ethmoid cavity bilaterally under direct  visualization.  These were then hydrated with sterile saline solution.  The  patient's oral cavity and oropharynx were examined and suctioned.  An  orogastric tube was passed, the stomach contents were aspirated.  The  patient was awakened from anesthetic, extubated, and transferred from the  operating room to the recovery room in stable condition.  There were no  complications.  Estimated blood loss was approximately 200 mL.      DLS/MEDQ  D:  62/95/2841  T:  12/07/2004  Job:  [id number]

## 2011-03-01 NOTE — Op Note (Signed)
[facility]  Patient:    Sabrina Moore, Sabrina Moore                        MRN: [id number] Proc. Date: 08/19/00 Adm. Date:  [id number] Attending:  Jeannette Corpus CC:         Clovis Pu [name], M.D.  Andres Ege, M.D.  Telford Nab, R.N.   Operative Report  PREOPERATIVE DIAGNOSIS:  Nausea, vomiting, and weight loss; rule out occult peritoneal malignancy.  POSTOPERATIVE DIAGNOSIS:  Normal peritoneal cavity.  OPERATION:  Diagnostic laparoscopy.  SURGEONS:  Daniel L. Clarke-Pearson, M.D.  ASSISTANT:  Andres Ege, M.D.  ANESTHESIA:  General with orotracheal tube.  ESTIMATED BLOOD LOSS: 5 cc.  SURGICAL FINDINGS:  At the time of laparoscopy, the peritoneal cavity was easily visualized.  There were few adhesions around the ascending colon and cecum from a prior appendectomy.  The remainder of the peritoneal cavity was normal.  Specifically, the diaphragm, liver capsule, gallbladder, stomach, omentum, small bowel, sigmoid, descending, ascending, and transverse colon appeared normal.  There were some additional adhesions between the sigmoid colon and the left pelvic sidewall where the patient had previously had a left salpingo-oophorectomy.  There were a few adhesions of the sigmoid colon to the right ovary which appeared normal.  The uterus also appeared normal.  There was no evidence of ascites or peritoneal implants.  DESCRIPTION OF PROCEDURE:  The patient was brought to the operating room, and after satisfactory attainment of general anesthesia, was placed in the modified lithotomy position in [place] stirrups.  The anterior abdominal, perineum, and vagina were prepped with Betadine.  A Foley catheter was placed, and the patient was draped.  A Hulka tenaculum was placed in the uterus to manipulate the uterus.  After assembling the laparoscopy equipment, and incision was made in the umbilicus and carried directly into the  peritoneal cavity.  Once it was established we were in the peritoneal cavity and there were no adhesions in the vicinity, a Hasson cannula was placed through the infraumbilical incision and sutured into place.  The peritoneal cavity was insufflated with carbon dioxide.  A second suprapubic incision (5 mm) was made in order to introduce instruments.  A trocar was placed through this incision under direct observation.  The upper abdomen and pelvis were explored with the above-noted findings.  Using a grasper, the small bowel was specifically run from the ligament of Treitz to the cecum.  Peritoneal washings were obtained from the pelvis after introducing 100 cc of saline.  There were no abnormalities.  It was felt appropriate to close.  The instruments were removed.  The fascia in the umbilicus was closed with interrupted figure-of-eight sutures of 0 Vicryl.  A subcuticular closure was then used to close the skin in the suprapubic and umbilical region. Steri-Strips were applied.  The patient was awakened from anesthesia and taken to the recovery room in satisfactory condition.  Sponge, needle, and instrument counts were correct x 2. DD:  08/19/00 TD:  08/19/00 Job: [id number]

## 2011-03-01 NOTE — Op Note (Signed)
[facility]  Patient:    Sabrina Moore, Sabrina Moore                        MRN: [id number] Proc. Date: 03/12/00 Adm. Date:  [id number] Attending:  Louie Bun CC:         Clovis Pu [name], M.D.                           Operative Report  PROCEDURE:  Esophagogastroduodenoscopy with biopsy.  ENDOSCOPIST:  Everardo All. Madilyn Fireman, M.D.  INDICATION FOR PROCEDURE:  History of nausea and chronic diarrhea with negative workup to date and failure to respond to numerous medical trials; she is also due to colon cancer screening due to a family history of colon cancer in a first degree relative.  It was elected to pursue EGD to assess for any gastroduodenal mucosal abnormalities and to obtain a CLOtest and small bowel biopsies to rule out a sprue-like lesion if no other abnormalities are noted.  DESCRIPTION OF PROCEDURE:  The patient was placed in the left lateral decubitus position and placed on the pulse monitor with continuous low-flow oxygen delivered by nasal cannula.  She was sedated with 50 mg IV Demerol and 4 mg IV Versed.  The Olympus videoendoscope was advanced under direct vision into the oropharynx and esophagus.  The esophagus was straight and of normal caliber, with the squamocolumnar line at 38 cm.  There was no visible hiatal hernia, ring, stricture or other abnormality at the GE junction.  The stomach was entered and a small amount of liquid secretions were suctioned from the fundus.  Retroflexed view of the cardia was unremarkable.  The fundus and body appeared normal.  The antrum showed diffuse erythema and granularity consistent with an antral gastritis.  CLOtest was obtained.  The pylorus was not deformed and easily allowed passage of the endoscope tip into the duodenum.  Both bulb and second portion were well inspected and appeared to be within normal limits.  The scope was then passed as far as possible down the duodenum and small bowel biopsies were  obtained.  The scope was then withdrawn and the patient returned to the recovery room in stable condition.  She tolerated the procedure well and there were no immediate complications.  IMPRESSION:  Antral gastritis; otherwise, normal endoscopy.  PLAN:  Await CLOtest and small bowel biopsies and will proceed with colonoscopy with biopsy as planned. DD:  03/12/00 TD:  03/13/00 Job: [id number]

## 2011-03-01 NOTE — Consult Note (Signed)
[facility]  Patient:    Sabrina Moore, Sabrina Moore                        MRN: [id number] Proc. Date: 07/15/00 Adm. Date:  [id number] Attending:  Jeannette Corpus CC:         Andres Ege, M.D.  Telford Nab, R.N.   Consultation Report  [facility]  [age]-year-old white married female, referred by Dr. Andres Ege for evaluation of nausea, belching and epigastric pain which have been persistent in excess of one year.  The patient has undergone an extensive GI workup which has been negative.  She did have an ultrasound that showed a small (3 x 4 x 4-cm) thin-walled right ovarian cyst.  She has also had a CA125 value which was 7.4 units on September 10th.  Followup ultrasound to reevaluate the cyst three months later showed no change in the cyst.  Patient has lost a fair amount of weight; initially, approximately a year ago, she weighed 127 pounds, dropped her weight to 113 pounds, and more recently, has gained to 119 pounds.  She has tried with using belladonna and phenobarbital.  Ginger-ale, oral ginger and potato soup are about all that she can drink.  In the course of the workup, she stopped Zocor and switched to Lescol and now is taking no anticholesterol medications.  CURRENT MEDICATIONS:  Sucralfate 1 g twice a day and belladonna p.r.n.  PAST MEDICAL ILLNESSES:  Angina and hypercholesterolemia.  PRIOR SURGERY:  Left salpingo-oophorectomy for a benign ovarian cyst (endometriosis) and a coronary artery bypass graft five years ago.  The patient is currently symptomatic from any cardiac symptoms.  She has also had a tonsillectomy and adenoidectomy.  DRUG ALLERGIES:  None.  FAMILY HISTORY:  The patients mother had ovarian cancer and breast cancer. Patient has a maternal aunt who has had breast cancer at age 97 and another maternal aunt with colon cancer and a brother with colon cancer.  SOCIAL HISTORY:  The  patient is married.  She has two living children.  PHYSICAL EXAMINATION  VITAL SIGNS:  Blood pressure 120/60.  Height 5 feet 2 inches.  Pulse 72. Respirations 16.  Weight 119 pounds.  GENERAL:  The patient is a pleasant, slender white female in no acute distress.  HEENT:  Negative.  NECK:  Supple without thyromegaly.  LYMPHATICS:  There is no supraclavicular or inguinal adenopathy.  ABDOMEN:  Soft and nontender.  No masses, organomegaly, ascites or herniae are noted.  PELVIC:  EG/BUS normal.  The vagina is clean, well-supported.  Cervix is normal.  Uterus is anterior, normal shape, size and consistency.  There is some right adnexal fullness without any discrete masses or nodularity. Rectovaginal exam confirms.  EXTREMITIES:  Lower extremities without edema or varicosities.  IMPRESSION:  Patients constellation of symptoms for over a year are concerning, especially given the fact that she has had a negative gastrointestinal workup.  Given also the strong family history of breast, ovarian and colon cancer, I think the patient is at higher than average risk to have primary peritoneal carcinoma.  I would recommend the patient undergo a CAT scan of the abdomen and pelvis to further evaluate intraperitoneal organs, searching for adenopathy or any free fluid.  Provided that study is essentially normal, I think it would be very reasonable to consider diagnostic laparoscopy with full assessment of the peritoneal cavity, including peritoneal washings.  While she has no specific identifiable pathology  except for the ovarian cyst, I think her symptoms would certainly justify the laparoscopic procedure.  The risks and benefits of laparoscopy were outlined with the patient and her husband.  We will communicate with Dr. Vonzella Nipple office and these recommendations. DD:  07/15/00 TD:  07/16/00 Job: [id number]

## 2011-03-06 ENCOUNTER — Other Ambulatory Visit: Payer: Self-pay | Admitting: Cardiology

## 2011-03-06 MED ORDER — CLOPIDOGREL BISULFATE 75 MG PO TABS: 75.0000 mg | ORAL_TABLET | Freq: Every day | ORAL | Status: DC

## 2011-03-06 NOTE — Telephone Encounter (Signed)
escribed for patient

## 2011-03-06 NOTE — Telephone Encounter (Signed)
Patient is out of her Plavix and would like for [place] to call in a refill to the [facility] on [address].  Pt said that we may already have a request from the drug store.

## 2011-03-12 ENCOUNTER — Ambulatory Visit (INDEPENDENT_AMBULATORY_CARE_PROVIDER_SITE_OTHER): Payer: Medicare Other | Admitting: Family Medicine

## 2011-03-12 ENCOUNTER — Ambulatory Visit
Admission: RE | Admit: 2011-03-12 | Discharge: 2011-03-12 | Disposition: A | Discharge status: Home / Self Care | Payer: Medicare Other | Source: Ambulatory Visit | Attending: Family Medicine | Admitting: Family Medicine

## 2011-03-12 ENCOUNTER — Encounter: Payer: Self-pay | Admitting: Family Medicine

## 2011-03-12 VITALS — BP 160/72 | Temp 98.2°F | Wt 110.0 lb

## 2011-03-12 DIAGNOSIS — R110 Nausea: Secondary | ICD-10-CM

## 2011-03-12 DIAGNOSIS — R1013 Epigastric pain: Secondary | ICD-10-CM

## 2011-03-12 DIAGNOSIS — R197 Diarrhea, unspecified: Secondary | ICD-10-CM

## 2011-03-12 LAB — CBC WITH DIFFERENTIAL/PLATELET
Eosinophils Relative: 3 % (ref 0.0–5.0)
HCT: 44.3 % (ref 36.0–46.0)
Hemoglobin: 15.2 g/dL — ABNORMAL HIGH (ref 12.0–15.0)
Lymphs Abs: 0.9 10*3/uL (ref 0.7–4.0)
MCV: 97.8 fl (ref 78.0–100.0)
Monocytes Relative: 13.7 % — ABNORMAL HIGH (ref 3.0–12.0)
Neutro Abs: 3.3 10*3/uL (ref 1.4–7.7)
RDW: 12.9 % (ref 11.5–14.6)
WBC: 5 10*3/uL (ref 4.5–10.5)

## 2011-03-12 LAB — LIPASE: Lipase: 29 U/L (ref 11.0–59.0)

## 2011-03-12 LAB — HEPATIC FUNCTION PANEL
Albumin: 3.7 g/dL (ref 3.5–5.2)
Alkaline Phosphatase: 47 U/L (ref 39–117)
Total Bilirubin: 0.5 mg/dL (ref 0.3–1.2)

## 2011-03-12 LAB — BASIC METABOLIC PANEL
Calcium: 9 mg/dL (ref 8.4–10.5)
GFR: 105.69 mL/min (ref >60.00)
Glucose, Bld: 104 mg/dL — ABNORMAL HIGH (ref 70–99)
Sodium: 132 mEq/L — ABNORMAL LOW (ref 135–145)

## 2011-03-12 NOTE — Progress Notes (Signed)
  Subjective:    Patient ID: Sabrina Moore, female    DOB: 07/18/1927, 77 y.o.   MRN: [id number]  HPI Patient is seen with multiple symptoms.  Onset about 4 days ago some digestive problems.  Intermittent nonbloody diarrhea, nausea, frequent burping and some midepigastric and right upper quadrant pain. She was concerned about gallbladder disease. Several siblings with history of gallstones. Patient has no history of gallbladder problems. She does have history of IBS, chronic anxiety, hypertension, CAD, fibromyalgia.  Patient has some abdominal pain midepigastric and right upper quadrant region. No hematemesis. Black stools but takes regular iron. Symptoms possibly worse after eating. Symptoms are intermittent. Somewhat of a burning and somewhat of a achy quality pain. Took TUMS with no relief. Pepcid helps slightly. Some nausea but only one episode of vomiting Friday. None since then.   Review of Systems  Constitutional: Positive for appetite change and unexpected weight change. Negative for fever and chills.  HENT: Negative for trouble swallowing.   Respiratory: Negative for cough, shortness of breath and wheezing.   Cardiovascular: Negative for chest pain, palpitations and leg swelling.  Gastrointestinal: Positive for nausea and diarrhea. Negative for abdominal pain, constipation, abdominal distention and anal bleeding.  Genitourinary: Negative for dysuria.  Neurological: Negative for dizziness and syncope.  Hematological: Negative for adenopathy. Does not bruise/bleed easily.       Objective:   Physical Exam  Constitutional: She is oriented to person, place, and time. She appears well-developed and well-nourished. No distress.  HENT:  Right Ear: External ear normal.  Left Ear: External ear normal.  Mouth/Throat: Oropharynx is clear and moist.  Eyes: Pupils are equal, round, and reactive to light.  Neck: Neck supple. No thyromegaly present.  Cardiovascular: Normal rate and regular  rhythm.   Abdominal: Soft. Bowel sounds are normal. She exhibits no distension and no mass. There is tenderness. There is no rebound and no guarding.       Patient has some mild tenderness midepigastric and right upper quadrant region. No hepatomegaly. No guarding or rebound.  Musculoskeletal: She exhibits no edema.  Lymphadenopathy:    She has no cervical adenopathy.  Neurological: She is alert and oriented to person, place, and time.  Psychiatric: She has a normal mood and affect. Her behavior is normal.          Assessment & Plan:  Patient presents with midepigastric and right upper quadrant pain and intermittent diarrhea and nausea. Differential includes nonspecific dyspepsia, GERD, symptomatic gallstones, PUD.  Check CBC, basic metabolic panel, hepatic panel and schedule upper abdomen ultrasound. Continue Pepcid AC and avoidance of iron

## 2011-03-12 NOTE — Patient Instructions (Signed)
Continue Pepcid  Bland diet and plenty of fluids. Follow up for any fever, vomiting, or any progressive pain.

## 2011-03-13 ENCOUNTER — Telehealth: Payer: Self-pay | Admitting: Family Medicine

## 2011-03-13 NOTE — Progress Notes (Signed)
Quick Note:  Pt informed and copy mailed to her home upon request. Pt has been taking a K supplement. She will D/C that as well as the iron pill and will have K repeated Thursday or Friday this week ______

## 2011-03-13 NOTE — Progress Notes (Signed)
Quick Note:  Pt informed ______ 

## 2011-03-13 NOTE — Telephone Encounter (Signed)
Pt had sonogram of abd needs results

## 2011-03-13 NOTE — Telephone Encounter (Signed)
Already answered but this was normal.

## 2011-03-14 ENCOUNTER — Other Ambulatory Visit (INDEPENDENT_AMBULATORY_CARE_PROVIDER_SITE_OTHER): Payer: Medicare Other

## 2011-03-14 DIAGNOSIS — E875 Hyperkalemia: Secondary | ICD-10-CM

## 2011-03-14 LAB — POTASSIUM: Potassium: 4.9 mEq/L (ref 3.5–5.1)

## 2011-03-14 NOTE — Telephone Encounter (Signed)
Pt informed

## 2011-03-14 NOTE — Telephone Encounter (Signed)
OK to start those back.

## 2011-03-14 NOTE — Telephone Encounter (Signed)
Pt informed again, she was concerned about her kidneys.  Pt has D/C the K and iron supplements.  She would like to know if she should start back on Fish oil and Co Q 10?

## 2011-03-14 NOTE — Progress Notes (Signed)
Quick Note:  Pt informed ______ 

## 2011-03-19 ENCOUNTER — Ambulatory Visit (INDEPENDENT_AMBULATORY_CARE_PROVIDER_SITE_OTHER): Payer: Medicare Other | Admitting: Family Medicine

## 2011-03-19 ENCOUNTER — Encounter: Payer: Self-pay | Admitting: Family Medicine

## 2011-03-19 VITALS — BP 130/68 | Temp 98.1°F | Wt 112.0 lb

## 2011-03-19 DIAGNOSIS — R1013 Epigastric pain: Secondary | ICD-10-CM

## 2011-03-19 DIAGNOSIS — K3189 Other diseases of stomach and duodenum: Secondary | ICD-10-CM

## 2011-03-19 LAB — POCT URINALYSIS DIPSTICK
Bilirubin, UA: NEGATIVE
Glucose, UA: NEGATIVE
Leukocytes, UA: NEGATIVE
Nitrite, UA: NEGATIVE
Urobilinogen, UA: 0.2

## 2011-03-19 MED ORDER — PANTOPRAZOLE SODIUM 40 MG IV SOLR: 40.0000 mg | Freq: Every day | INTRAVENOUS | Status: DC

## 2011-03-19 MED ORDER — PANTOPRAZOLE SODIUM 40 MG PO TBEC: 40.0000 mg | DELAYED_RELEASE_TABLET | Freq: Every day | ORAL | Status: DC

## 2011-03-19 NOTE — Patient Instructions (Signed)
Discontinue Pepcid AC for now Start protonix 40 mg one tablet daily Start back soma one tablet at night for fibromyalgia symptoms Try to resume regular exercise

## 2011-03-19 NOTE — Progress Notes (Signed)
  Subjective:    Patient ID: Sabrina Moore, female    DOB: Jul 05, 1927, 77 y.o.   MRN: [id number]  HPI Patient seen for followup. She has multiple somatic complaints. Her past history is significant for CAD, dyslipidemia, IBS, reported fibromyalgia, chronic anxiety, and mild hypertension. We have had several recent lab work and this was all reviewed. She had minimally elevated potassium and on repeat this was normal. Lipase normal. Prior anemia had resolved. TSH last summer normal.  Patient complains of pervasive fatigue. Nonspecific body aches both upper and lower extremities. We obtain sedimentation rate recently to rule out polymyalgia rheumatica and this was normal. She has had some recent intermittent urinary symptoms of burning and frequency but none for 2 days. No fever or chills.  Hx fibromyalgia.  Intermittent dizziness. Combination of lightheadedness and intermittent vertigo. No syncope.  Takes metoprolol 25 mg daily for hypertension. She states she is drinking plenty.  Recent epigastric pain and right upper quadrant pain. Ultrasound of gallstones. No significant abnormalities. Some relief in the epigastric symptoms with Pepcid AC. She has some postprandial nausea which is intermittent. Weight is actually up 2 pounds with recent albumin 3.7 which is improved from previous readings.   Review of Systems  Constitutional: Positive for fatigue. Negative for fever, activity change, appetite change and unexpected weight change.  HENT: Negative for trouble swallowing.   Respiratory: Negative for cough, shortness of breath and wheezing.   Cardiovascular: Negative for chest pain, palpitations and leg swelling.  Gastrointestinal: Positive for abdominal pain, diarrhea and constipation. Negative for vomiting and blood in stool.  Genitourinary: Positive for dysuria.  Skin: Negative for rash.  Neurological: Positive for weakness. Negative for dizziness, seizures, syncope and headaches.  Hematological:  Negative for adenopathy. Does not bruise/bleed easily.       Objective:   Physical Exam  Constitutional: She is oriented to person, place, and time. She appears well-developed and well-nourished.  HENT:  Head: Normocephalic and atraumatic.  Right Ear: External ear normal.  Left Ear: External ear normal.  Nose: Nose normal.  Mouth/Throat: Oropharynx is clear and moist.  Cardiovascular: Normal rate and regular rhythm.  Exam reveals no gallop.   Pulmonary/Chest: Effort normal and breath sounds normal. No respiratory distress. She has no wheezes. She has no rales.  Abdominal: Soft. She exhibits no distension.       She has mild midepigastric tenderness. No guarding or rebound. No hepatomegaly noted.  Musculoskeletal: She exhibits no edema.  Neurological: She is alert and oriented to person, place, and time. No cranial nerve deficit.  Psychiatric: She has a normal mood and affect. Her behavior is normal.          Assessment & Plan:  #1 recent complaints of epigastric pain. Ultrasound results as above. Lab work unremarkable. No anemia. History of dyspepsia which may be chronic. Reported IBS history. Short-term trial of protonix 40 mg daily. Consider GI referral symptoms persist #2 fatigue which may be multifactorial. Suspect underlying anxiety issues. Recent lab work unrevealing. #3 history of fibromyalgia. She'll start back soma for nighttime use. Try resume exercise. #4 recent dysuria. Rule out UTI.

## 2011-04-18 ENCOUNTER — Ambulatory Visit (INDEPENDENT_AMBULATORY_CARE_PROVIDER_SITE_OTHER): Payer: Medicare Other | Admitting: Family Medicine

## 2011-04-18 ENCOUNTER — Encounter: Payer: Self-pay | Admitting: Family Medicine

## 2011-04-18 VITALS — BP 140/70 | Temp 98.3°F | Wt 110.0 lb

## 2011-04-18 DIAGNOSIS — R110 Nausea: Secondary | ICD-10-CM

## 2011-04-18 DIAGNOSIS — R634 Abnormal weight loss: Secondary | ICD-10-CM

## 2011-04-18 NOTE — Progress Notes (Signed)
  Subjective:    Patient ID: Sabrina Moore, female    DOB: 24-May-1927, 77 y.o.   MRN: [id number]  HPI Patient seen with some persistent nausea which tends to be postprandial and some mild weight loss. Refer to prior notes. History of IBS. She had ultrasound which was basically unremarkable. Lab work has been likewise unremarkable. She reports remote EGD about 6 years ago. She takes low-dose aspirin 81 mg and Plavix. No hematemesis or melena. She has intermittent constipation and occasional loose stools which has been chronic. She is eating mostly bland foods. Empiric trial of Protonix last visit 40 mg daily and she has not seen much change with that. Her main symptom is nausea.  She has some poorly localized abdominal pain mostly epigastric but occasional fleeting pains bilateral lower quadrants as well.  Past Medical History  Diagnosis Date  . IHD (ischemic heart disease)   . Chest discomfort   . Abnormal stress ECG with treadmill 02/01/10  . Dyslipidemia   . Vasovagal syncope   . IDA (iron deficiency anemia)   . IBS (irritable bowel syndrome)   . Fatigue   . Anxiety   . Hypertension   . Hypercholesterolemia    Past Surgical History  Procedure Date  . Coronary artery bypass graft 1996  . Cardiac catheterization 02/09/10  . Appendectomy 1967  . Tonsillectomy 1933  . Oophorectomy 1967  . Eye surgery 2009    catarac  . Squamous cell carcinoma excision 2012    reports that she has never smoked. She does not have any smokeless tobacco history on file. She reports that she does not drink alcohol or use illicit drugs. family history includes Arthritis in her father; Breast cancer in her mother; Cancer (age of onset:93) in her mother; Heart disease in her father; and Ovarian cancer in her mother. Allergies  Allergen Reactions  . Crestor (Rosuvastatin Calcium)   . Lescol     Gi symptoms  . Lipitor (Atorvastatin Calcium)   . Lovastatin   . Pravachol   . Zocor (Simvastatin)       Review of Systems  Constitutional: Positive for fatigue and unexpected weight change. Negative for fever and chills.  Respiratory: Negative for cough and shortness of breath.   Cardiovascular: Negative for chest pain, palpitations and leg swelling.  Gastrointestinal: Positive for nausea, abdominal pain, diarrhea and constipation. Negative for vomiting, blood in stool and abdominal distention.  Genitourinary: Negative for dysuria.  Neurological: Negative for dizziness, syncope and headaches.  Psychiatric/Behavioral: Negative for dysphoric mood.       Objective:   Physical Exam  Constitutional: She appears well-developed and well-nourished.  HENT:  Mouth/Throat: Oropharynx is clear and moist. No oropharyngeal exudate.  Neck: Neck supple. No thyromegaly present.  Cardiovascular: Normal rate and regular rhythm.   Pulmonary/Chest: Effort normal and breath sounds normal. No respiratory distress. She has no wheezes. She has no rales.  Abdominal: Soft. Bowel sounds are normal. She exhibits no distension and no mass. There is no tenderness. There is no rebound and no guarding.  Lymphadenopathy:    She has no cervical adenopathy.          Assessment & Plan:  Patient presents with persistent nausea without vomiting along with mild weight loss with known history of IBS. Refer back to gastroenterologist at this time for consideration of further evaluation.

## 2011-04-23 ENCOUNTER — Other Ambulatory Visit: Payer: Self-pay | Admitting: *Deleted

## 2011-04-23 DIAGNOSIS — E785 Hyperlipidemia, unspecified: Secondary | ICD-10-CM

## 2011-05-02 ENCOUNTER — Other Ambulatory Visit: Payer: Self-pay | Admitting: Cardiology

## 2011-05-02 NOTE — Telephone Encounter (Signed)
Faxed refill for nitrostat to [facility] # [phone]

## 2011-05-06 ENCOUNTER — Other Ambulatory Visit: Payer: Medicare Other | Admitting: *Deleted

## 2011-05-09 ENCOUNTER — Ambulatory Visit (INDEPENDENT_AMBULATORY_CARE_PROVIDER_SITE_OTHER): Payer: Medicare Other | Admitting: *Deleted

## 2011-05-09 DIAGNOSIS — E785 Hyperlipidemia, unspecified: Secondary | ICD-10-CM

## 2011-05-09 DIAGNOSIS — I10 Essential (primary) hypertension: Secondary | ICD-10-CM

## 2011-05-09 DIAGNOSIS — E7800 Pure hypercholesterolemia, unspecified: Secondary | ICD-10-CM

## 2011-05-09 LAB — BASIC METABOLIC PANEL
CO2: 30 mEq/L (ref 19–32)
Chloride: 99 mEq/L (ref 96–112)
GFR: 122.55 mL/min (ref >60.00)
Glucose, Bld: 105 mg/dL — ABNORMAL HIGH (ref 70–99)
Potassium: 4 mEq/L (ref 3.5–5.1)
Sodium: 133 mEq/L — ABNORMAL LOW (ref 135–145)

## 2011-05-09 LAB — HEPATIC FUNCTION PANEL
ALT: 19 U/L (ref 0–35)
AST: 27 U/L (ref 0–37)
Albumin: 3.1 g/dL — ABNORMAL LOW (ref 3.5–5.2)
Alkaline Phosphatase: 50 U/L (ref 39–117)
Total Protein: 5.6 g/dL — ABNORMAL LOW (ref 6.0–8.3)

## 2011-05-09 LAB — LIPID PANEL: VLDL: 16.4 mg/dL (ref 0.0–40.0)

## 2011-05-10 ENCOUNTER — Other Ambulatory Visit: Payer: Medicare Other | Admitting: *Deleted

## 2011-05-13 ENCOUNTER — Ambulatory Visit (INDEPENDENT_AMBULATORY_CARE_PROVIDER_SITE_OTHER): Payer: Medicare Other | Admitting: Cardiology

## 2011-05-13 ENCOUNTER — Encounter: Payer: Self-pay | Admitting: Cardiology

## 2011-05-13 DIAGNOSIS — R0789 Other chest pain: Secondary | ICD-10-CM

## 2011-05-13 DIAGNOSIS — R5381 Other malaise: Secondary | ICD-10-CM

## 2011-05-13 DIAGNOSIS — R5383 Other fatigue: Secondary | ICD-10-CM

## 2011-05-13 DIAGNOSIS — E7800 Pure hypercholesterolemia, unspecified: Secondary | ICD-10-CM

## 2011-05-13 NOTE — Progress Notes (Signed)
Sabrina Moore Date of Birth:  1927-08-20 [facility] [address]   [address] [place], [place]  [address] [phone]           Fax   [phone]  History of Present Illness: This pleasant 77 year old married Caucasian female is seen for a scheduled followup office visit.  She has a history of ischemic heart disease.  She had coronary artery bypass graft surgery in 1996.  She had an abnormal treadmill Cardiolite stress test in April 2011 which was followed by cardiac catheterization and subsequent PCI on 02/12/10 with successful stenting of the saphenous vein graft to the diagonal and also successful stenting of the saphenous vein graft to the intermediate.  She has done well since then.  He remains on Plavix she had a subsequent admission to the hospital with chest pain and ruled out for myocardial infarction and also a subsequent admission to [facility] on 06/13/10 with a diagnosis of vasovagal syncope iron deficiency anemia and irritable bowel syndrome.  She is in the midst of a GI workup with Dr. Caryl Never and Dr. Dorena Cookey.  She is scheduled to have upper endoscopy next week on August 8.  She continues to be under weight.  Her weight is down 7 pounds since we last saw her.  She eats very small amounts of food because she gets nauseated so easily.  She does not put much salt on her food.  She has been having symptoms of dizziness particularly when she changes position.  She has hypercholesterolemia but is unable to tolerate statin therapy because of myalgias which exacerbated her underlying diagnosis of fibromyalgia she continues to experience malaise and fatigue.  Current Outpatient Prescriptions  Medication Sig Dispense Refill  . ALPHA LIPOIC ACID PO Take by mouth daily.        Marland Kitchen ALPRAZolam (XANAX) 0.25 MG tablet Take 0.25 mg by mouth 2 (two) times daily.        Marland Kitchen aspirin 81 MG tablet Take 81 mg by mouth daily.        . Calcium Carbonate-Vit D-Min (CALCIUM 1200  PO) Take by mouth daily.        . carisoprodol (SOMA) 350 MG tablet TAKE 1 TABLET BY MOUTH EVERY 6 HOURS AS NEEDED FOR BACK SPASM  30 tablet  2  . clopidogrel (PLAVIX) 75 MG tablet Take 1 tablet (75 mg total) by mouth daily.  90 tablet  3  . Coenzyme Q10 (COQ10 PO) Take by mouth daily.        . Cyanocobalamin (B-12 PO) Take by mouth daily.        . Digestive Enzymes (ENZYME DIGEST PO) Take by mouth daily.        Marland Kitchen MAGNESIUM CITRATE PO Take by mouth daily.        . metoprolol succinate (TOPROL-XL) 25 MG 24 hr tablet Take 12.5 mg by mouth daily.        . mometasone (NASONEX) 50 MCG/ACT nasal spray 2 sprays by Nasal route as needed.        . Multiple Vitamin (MULTIVITAMIN) tablet Take 1 tablet by mouth daily.        Marland Kitchen NITROSTAT 0.4 MG SL tablet PLACE 1 TABLET UNDER TONGUE IF NEEDED FOR CHEST PAIN  25 tablet  1  . NYSTATIN PO Take by mouth. 5cc qid       . Omega-3 Fatty Acids (FISH OIL PO) Take by mouth daily.        Marland Kitchen  pantoprazole (PROTONIX) 40 MG tablet Take 1 tablet (40 mg total) by mouth daily.  30 tablet  6  . Probiotic Product (PROBIOTIC PO) Take by mouth daily.        . Psyllium (METAMUCIL FIBER SINGLES PO) Take by mouth daily.        . Pyridoxine HCl (B-6 PO) Take by mouth daily.         Current Facility-Administered Medications  Medication Dose Route Frequency Provider Last Rate Last Dose  . pneumococcal 23 valent vaccine (PNU-IMMUNE) injection 0.5 mL  0.5 mL Intramuscular Once Bruce W Burchette        Allergies  Allergen Reactions  . Crestor (Rosuvastatin Calcium)   . Lescol     Gi symptoms  . Lipitor (Atorvastatin Calcium)   . Lovastatin   . Pravachol   . Zocor (Simvastatin)     Patient Active Problem List  Diagnoses  . IHD (ischemic heart disease)  . Chest discomfort  . Abnormal stress ECG with treadmill  . Dyslipidemia  . Vasovagal syncope  . IDA (iron deficiency anemia)  . IBS (irritable bowel syndrome)  . Fatigue  . Anxiety  . Hypertension  .  Hypercholesterolemia  . Hx of CABG  . Fibromyalgia  . Normocytic anemia    History  Smoking status  . Never Smoker   Smokeless tobacco  . Not on file    History  Alcohol Use No    Family History  Problem Relation Age of Onset  . Breast cancer Mother   . Ovarian cancer Mother   . Cancer Mother 2    ovarian  . Heart disease Father   . Arthritis Father     Review of Systems: Constitutional: no fever chills diaphoresis.She does have malaise and fatigue and she has had recent weight loss.  Head and neck: no hearing loss, no epistaxis, no photophobia or visual disturbance. Respiratory: No cough, shortness of breath or wheezing. Cardiovascular: No chest pain peripheral edema, palpitations. Gastrointestinal: No abdominal distention, no abdominal pain, no change in bowel habits hematochezia or melena.She does have frequent nausea and early satiety Genitourinary: No dysuria, no frequency, no urgency, no nocturia. Musculoskeletal:No arthralgias, no back pain, no gait disturbance or myalgias. Neurological: No dizziness, no headaches, no numbness, no seizures, no syncope, no weakness, no tremors. Hematologic: No lymphadenopathy, no easy bruising. Psychiatric: No confusion, no hallucinations, no sleep disturbance.    Physical Exam: Filed Vitals:   05/13/11 1417  BP: 130/52  Pulse: 70  The general appearance reveals a well-developed thin woman in no distress.Pupils equal and reactive.   Extraocular Movements are full.  There is no scleral icterus.  The mouth and pharynx are normal.  The neck is supple.  The carotids reveal no bruits.  The jugular venous pressure is normal.  The thyroid is not enlarged.  There is no lymphadenopathy.  The chest is clear to percussion and auscultation. There are no rales or rhonchi. Expansion of the chest is symmetrical.  The precordium is quiet.  The first heart sound is normal.  The second heart sound is physiologically split.  There is no murmur  gallop rub or click.  There is no abnormal lift or heave.  The abdomen is soft and nontender. Bowel sounds are normal. The liver and spleen are not enlarged. There Are no abdominal masses. There are no bruits.  The pedal pulses are good.  There is no phlebitis or edema.  There is no cyanosis or clubbing.  Strength is normal and  symmetrical in all extremities.  There is no lateralizing weakness.  There are no sensory deficits.  The skin is warm and dry.  There is no rash.     Assessment / Plan: We reviewed her labs which were drawn 4 days ago.  She does have mild hyponatremia and her symptoms are suggestive of mild hypovolemia as a cause of her frequent dizzy spells.  We will have her increase her dietary salt intake and see if this will help boost up her blood pressure a little and decrease the frequency of her dizzy spells.  She'll continue other medicines the same and be rechecked in 3 months for office visit EKG and basal metabolic panel.

## 2011-05-13 NOTE — Assessment & Plan Note (Signed)
The patient remains very fatigued.  She has lost 7 pounds since we last saw her and she continues to have a lot of GI issues.  She has been experiencing frequent episodes of weakness and dizziness and her dizziness is particularly pronounced when she changes position such as getting up from a chair.  Or going from a lying to a sitting position.

## 2011-05-13 NOTE — Assessment & Plan Note (Signed)
The patient has a history of known ischemic heart disease and is status post CABG in 1996 by Dr. Tyrone Sage.  She continues to have occasional episodes of chest pain relieved by sublingual nitroglycerin.  Her last nuclear stress test was 4/21/11Which showed moderate reversible ischemia in the anterolateral wall.  Subsequent cardiac catheterization was performed off 02/09/10 and then on 02/12/10 Dr. Deborah Chalk performed successful stenting of the saphenous vein graft to the diagonal and also perform successful stenting of the saphenous vein graft to the intermediate.  Since then she has done better in terms of her chest pain.

## 2011-05-13 NOTE — Assessment & Plan Note (Signed)
The patient has a history of hypercholesterolemia.  She is intolerant of statin therapy however. Statin therapy makes her fibromyalgia much worse.  Fortunately her most recent lipids are improving. Her LDL is down to 112 which is good for her.  We have not been able to push a stringent diet to severely because of her significant weight loss and poor intake of food.  She has a upper GI examination scheduled for August 8 with Dr. Madilyn Fireman she has been checked for celiac disease and H. Pylori and those tests have turned up negative

## 2011-05-16 ENCOUNTER — Other Ambulatory Visit: Payer: Self-pay | Admitting: Family Medicine

## 2011-05-31 ENCOUNTER — Other Ambulatory Visit: Payer: Self-pay | Admitting: *Deleted

## 2011-05-31 DIAGNOSIS — F419 Anxiety disorder, unspecified: Secondary | ICD-10-CM

## 2011-05-31 MED ORDER — ALPRAZOLAM 0.25 MG PO TABS: 0.2500 mg | ORAL_TABLET | Freq: Two times a day (BID) | ORAL | Status: DC

## 2011-05-31 NOTE — Telephone Encounter (Signed)
Refilled meds per fax request. Signed faxed Rx back

## 2011-06-07 ENCOUNTER — Ambulatory Visit (INDEPENDENT_AMBULATORY_CARE_PROVIDER_SITE_OTHER): Payer: Medicare Other | Admitting: Family Medicine

## 2011-06-07 ENCOUNTER — Encounter: Payer: Self-pay | Admitting: Family Medicine

## 2011-06-07 VITALS — BP 140/60 | Temp 98.0°F | Wt 113.0 lb

## 2011-06-07 DIAGNOSIS — R51 Headache: Secondary | ICD-10-CM

## 2011-06-07 NOTE — Progress Notes (Signed)
  Subjective:    Patient ID: Sabrina Moore, female    DOB: 10-03-1927, 77 y.o.   MRN: [id number]  HPI Patient seen with somewhat poorly localized right facial pain past 4 days. She describes a sharp pain mostly TMJ region radiates towards the midline of the chin. No rash. Pain is sharp and 7/10 intensity. Pain is relatively steady and constant. Aspirin without relief. Denies earache. No sore throat. No adenopathy noted. No reported history of TMJ. She saw chiropractor earlier today and they were concerned about potential for heart issues with her heart history. She has not had any chest pain and not any left neck or face pains. Denies any cervical neck pain.   Review of Systems  Constitutional: Negative for fever, chills, appetite change and unexpected weight change.  HENT: Negative for sore throat, trouble swallowing, neck pain, voice change and postnasal drip.   Cardiovascular: Negative for chest pain.  Skin: Negative for rash.  Hematological: Negative for adenopathy. Does not bruise/bleed easily.       Objective:   Physical Exam  Constitutional: She appears well-developed and well-nourished.  HENT:  Head: Normocephalic and atraumatic.  Right Ear: External ear normal.  Left Ear: External ear normal.  Mouth/Throat: Oropharynx is clear and moist.       Patient has tenderness over the right TMJ region. She has some mild misalignment. No parotid swelling. No visible erythema and no warmth. Neck reveals no adenopathy  Neck: Neck supple. No thyromegaly present.  Cardiovascular: Normal rate and regular rhythm.   Pulmonary/Chest: Effort normal and breath sounds normal. No respiratory distress. She has no wheezes. She has no rales. She exhibits no tenderness.  Lymphadenopathy:    She has no cervical adenopathy.          Assessment & Plan:  Right facial pain. Suspect TMJ related. Try ice topically. Avoid hard to chew foods. Continue low-dose aspirin as needed.

## 2011-06-07 NOTE — Patient Instructions (Signed)
Continue ice to TMJ region Avoid hard to chew foods. Consider getting back on muscle relaxer at night.

## 2011-06-10 ENCOUNTER — Encounter: Payer: Self-pay | Admitting: Family Medicine

## 2011-06-10 ENCOUNTER — Ambulatory Visit (INDEPENDENT_AMBULATORY_CARE_PROVIDER_SITE_OTHER): Payer: Medicare Other | Admitting: Family Medicine

## 2011-06-10 VITALS — BP 120/70 | Temp 98.8°F | Wt 112.0 lb

## 2011-06-10 DIAGNOSIS — B029 Zoster without complications: Secondary | ICD-10-CM

## 2011-06-10 MED ORDER — VALACYCLOVIR HCL 1 G PO TABS: 1000.0000 mg | ORAL_TABLET | Freq: Three times a day (TID) | ORAL | Status: DC

## 2011-06-10 MED ORDER — OXYCODONE HCL 5 MG PO CAPS: 5.0000 mg | ORAL_CAPSULE | Freq: Four times a day (QID) | ORAL | Status: AC | PRN

## 2011-06-10 NOTE — Progress Notes (Signed)
  Subjective:    Patient ID: Sabrina Moore, female    DOB: 1926/12/02, 77 y.o.   MRN: [id number]  HPI Painful rash right side of face. Refer to prior note. Patient presented on Friday with pain around TMJ region but no rash. Over the weekend broke out with some blisters. Severe pain 9-10 out of 10. Not relieved with Tylenol. Pain around the right ear but no canal involvement. No hearing changes. No facial nerve weakness. No blurred vision or eye pain.   Review of Systems  Constitutional: Negative for fever and chills.  HENT: Positive for ear pain. Negative for hearing loss, sore throat and trouble swallowing.   Skin: Positive for rash.  Hematological: Negative for adenopathy.       Objective:   Physical Exam  Constitutional: She appears well-developed and well-nourished.  HENT:  Head: Normocephalic and atraumatic.  Right Ear: External ear normal.  Left Ear: External ear normal.       Patient is a couple small vesicles right inner lower lip  Eyes: Conjunctivae are normal. Pupils are equal, round, and reactive to light. Right eye exhibits no discharge. Left eye exhibits no discharge.       R eye cornea normal.  Cardiovascular: Normal rate and regular rhythm.   Pulmonary/Chest: Effort normal and breath sounds normal.  Skin:       Patient has clustered vesicles right chin and a couple right outer lower lip          Assessment & Plan:  Shingles. Valtrex 1 g 3 times a day for 7 days. Oxycodone 5 mg one every 6 hours as needed for pain not relieved with Tylenol. Reassess one week

## 2011-06-10 NOTE — Patient Instructions (Signed)
Consider stool softener and plenty of fluids to avoid constipation.

## 2011-06-11 ENCOUNTER — Telehealth: Payer: Self-pay | Admitting: *Deleted

## 2011-06-11 NOTE — Telephone Encounter (Signed)
First, she can take up to 2 Oxycodone 5mg  every 6 hours prn.  If she has concern for any lesions around her eye we should reassess tomorrow.  She is already on Valtrex.

## 2011-06-11 NOTE — Telephone Encounter (Signed)
Pt is calling to let Dr. Caryl Never the shingles are progressing very close to her eye.  The pain is intense, and the Oxycodone is not covering her pain.

## 2011-06-12 ENCOUNTER — Inpatient Hospital Stay (HOSPITAL_COMMUNITY)
Admission: EM | Admit: 2011-06-12 | Discharge: 2011-06-17 | DRG: 392 | Disposition: A | Discharge status: Home Health | Payer: Medicare Other | Attending: Internal Medicine | Admitting: Internal Medicine

## 2011-06-12 ENCOUNTER — Emergency Department (HOSPITAL_COMMUNITY): Payer: Medicare Other

## 2011-06-12 ENCOUNTER — Telehealth: Payer: Self-pay | Admitting: Family Medicine

## 2011-06-12 DIAGNOSIS — Z951 Presence of aortocoronary bypass graft: Secondary | ICD-10-CM

## 2011-06-12 DIAGNOSIS — X58XXXA Exposure to other specified factors, initial encounter: Secondary | ICD-10-CM

## 2011-06-12 DIAGNOSIS — Z7902 Long term (current) use of antithrombotics/antiplatelets: Secondary | ICD-10-CM

## 2011-06-12 DIAGNOSIS — E871 Hypo-osmolality and hyponatremia: Secondary | ICD-10-CM | POA: Diagnosis present

## 2011-06-12 DIAGNOSIS — I10 Essential (primary) hypertension: Secondary | ICD-10-CM | POA: Diagnosis present

## 2011-06-12 DIAGNOSIS — Y92009 Unspecified place in unspecified non-institutional (private) residence as the place of occurrence of the external cause: Secondary | ICD-10-CM

## 2011-06-12 DIAGNOSIS — Z888 Allergy status to other drugs, medicaments and biological substances status: Secondary | ICD-10-CM

## 2011-06-12 DIAGNOSIS — K589 Irritable bowel syndrome without diarrhea: Secondary | ICD-10-CM | POA: Diagnosis present

## 2011-06-12 DIAGNOSIS — Z7982 Long term (current) use of aspirin: Secondary | ICD-10-CM

## 2011-06-12 DIAGNOSIS — Z881 Allergy status to other antibiotic agents status: Secondary | ICD-10-CM

## 2011-06-12 DIAGNOSIS — T40605A Adverse effect of unspecified narcotics, initial encounter: Secondary | ICD-10-CM | POA: Diagnosis present

## 2011-06-12 DIAGNOSIS — I2510 Atherosclerotic heart disease of native coronary artery without angina pectoris: Secondary | ICD-10-CM | POA: Diagnosis present

## 2011-06-12 DIAGNOSIS — R112 Nausea with vomiting, unspecified: Principal | ICD-10-CM | POA: Diagnosis present

## 2011-06-12 DIAGNOSIS — F411 Generalized anxiety disorder: Secondary | ICD-10-CM | POA: Diagnosis present

## 2011-06-12 DIAGNOSIS — S32009A Unspecified fracture of unspecified lumbar vertebra, initial encounter for closed fracture: Secondary | ICD-10-CM | POA: Diagnosis present

## 2011-06-12 DIAGNOSIS — I252 Old myocardial infarction: Secondary | ICD-10-CM

## 2011-06-12 DIAGNOSIS — B028 Zoster with other complications: Secondary | ICD-10-CM | POA: Diagnosis present

## 2011-06-12 DIAGNOSIS — E785 Hyperlipidemia, unspecified: Secondary | ICD-10-CM | POA: Diagnosis present

## 2011-06-12 DIAGNOSIS — E559 Vitamin D deficiency, unspecified: Secondary | ICD-10-CM | POA: Diagnosis present

## 2011-06-12 DIAGNOSIS — Z79899 Other long term (current) drug therapy: Secondary | ICD-10-CM

## 2011-06-12 DIAGNOSIS — Z9861 Coronary angioplasty status: Secondary | ICD-10-CM

## 2011-06-12 DIAGNOSIS — Z91013 Allergy to seafood: Secondary | ICD-10-CM

## 2011-06-12 LAB — BASIC METABOLIC PANEL
BUN: 8 mg/dL (ref 6–23)
BUN: 6 mg/dL (ref 6–23)
BUN: 9 mg/dL (ref 6–23)
CO2: 28 mEq/L (ref 19–32)
Calcium: 8 mg/dL — ABNORMAL LOW (ref 8.4–10.5)
Chloride: 81 mEq/L — ABNORMAL LOW (ref 96–112)
Chloride: 94 mEq/L — ABNORMAL LOW (ref 96–112)
Creatinine, Ser: <0.47 mg/dL — ABNORMAL LOW (ref 0.50–1.10)
Creatinine, Ser: 0.5 mg/dL (ref 0.50–1.10)
Creatinine, Ser: 0.54 mg/dL (ref 0.50–1.10)
GFR calc Af Amer: >60 mL/min (ref >60)
Glucose, Bld: 152 mg/dL — ABNORMAL HIGH (ref 70–99)
Glucose, Bld: 126 mg/dL — ABNORMAL HIGH (ref 70–99)

## 2011-06-12 LAB — URINALYSIS, ROUTINE W REFLEX MICROSCOPIC
Bilirubin Urine: NEGATIVE
Ketones, ur: 40 mg/dL — AB
Nitrite: NEGATIVE
pH: 7 (ref 5.0–8.0)

## 2011-06-12 LAB — POCT I-STAT, CHEM 8
BUN: 7 mg/dL (ref 6–23)
Calcium, Ion: 1.05 mmol/L — ABNORMAL LOW (ref 1.12–1.32)
HCT: 32 % — ABNORMAL LOW (ref 36.0–46.0)
Hemoglobin: 10.9 g/dL — ABNORMAL LOW (ref 12.0–15.0)
Sodium: 114 mEq/L — CL (ref 135–145)
TCO2: 23 mmol/L (ref 0–100)

## 2011-06-12 LAB — HEPATIC FUNCTION PANEL
Alkaline Phosphatase: 52 U/L (ref 39–117)
Bilirubin, Direct: <0.1 mg/dL (ref 0.0–0.3)
Total Bilirubin: 0.5 mg/dL (ref 0.3–1.2)

## 2011-06-12 LAB — POCT I-STAT TROPONIN I: Troponin i, poc: 0 ng/mL (ref 0.00–0.08)

## 2011-06-12 MED ORDER — IOHEXOL 300 MG/ML  SOLN
80.0000 mL | Freq: Once | INTRAMUSCULAR | Status: AC | PRN
  Administered 2011-06-12: 80 mL via INTRAVENOUS

## 2011-06-12 NOTE — Telephone Encounter (Signed)
I called pt home, spoke with her husband.  3 am he and son called EMS, pt was vomiting, C/O severe pain, EMS transported to [facility], she was admitted for additional testing. FYI

## 2011-06-12 NOTE — Telephone Encounter (Signed)
Pts daughter called and said that her mother was sent by ambulance at 5:30am today to [facility] because pt was vomitting, extreme nausea and pain. Pt is in ER now and is being admitted. Just wanted to make Dr Caryl Never aware.

## 2011-06-13 ENCOUNTER — Inpatient Hospital Stay (HOSPITAL_COMMUNITY): Payer: Medicare Other

## 2011-06-13 LAB — CBC
HCT: 26.4 % — ABNORMAL LOW (ref 36.0–46.0)
HCT: 26.4 % — ABNORMAL LOW (ref 36.0–46.0)
MCH: 27.7 pg (ref 26.0–34.0)
MCH: 27.2 pg (ref 26.0–34.0)
MCHC: 34.5 g/dL (ref 30.0–36.0)
MCV: 80.2 fL (ref 78.0–100.0)
MCV: 81.7 fL (ref 78.0–100.0)
Platelets: 240 10*3/uL (ref 150–400)
RDW: 12.9 % (ref 11.5–15.5)
RDW: 13 % (ref 11.5–15.5)

## 2011-06-13 LAB — BASIC METABOLIC PANEL
BUN: 11 mg/dL (ref 6–23)
BUN: 8 mg/dL (ref 6–23)
CO2: 25 mEq/L (ref 19–32)
Calcium: 7.9 mg/dL — ABNORMAL LOW (ref 8.4–10.5)
Creatinine, Ser: <0.47 mg/dL — ABNORMAL LOW (ref 0.50–1.10)
Creatinine, Ser: 0.49 mg/dL — ABNORMAL LOW (ref 0.50–1.10)
GFR calc Af Amer: >60 mL/min (ref >60)
Glucose, Bld: 121 mg/dL — ABNORMAL HIGH (ref 70–99)

## 2011-06-13 LAB — TSH: TSH: 1.596 u[IU]/mL (ref 0.350–4.500)

## 2011-06-13 LAB — MRSA PCR SCREENING: MRSA by PCR: NEGATIVE

## 2011-06-14 LAB — BASIC METABOLIC PANEL
BUN: 6 mg/dL (ref 6–23)
Chloride: 100 mEq/L (ref 96–112)
Creatinine, Ser: <0.47 mg/dL — ABNORMAL LOW (ref 0.50–1.10)
Glucose, Bld: 90 mg/dL (ref 70–99)

## 2011-06-15 LAB — BASIC METABOLIC PANEL
BUN: 8 mg/dL (ref 6–23)
CO2: 33 mEq/L — ABNORMAL HIGH (ref 19–32)
Calcium: 8.4 mg/dL (ref 8.4–10.5)
Chloride: 99 mEq/L (ref 96–112)
Creatinine, Ser: <0.47 mg/dL — ABNORMAL LOW (ref 0.50–1.10)

## 2011-06-18 ENCOUNTER — Ambulatory Visit: Payer: Medicare Other | Admitting: Family Medicine

## 2011-06-21 ENCOUNTER — Ambulatory Visit (INDEPENDENT_AMBULATORY_CARE_PROVIDER_SITE_OTHER): Payer: Medicare Other | Admitting: Family Medicine

## 2011-06-21 ENCOUNTER — Encounter: Payer: Self-pay | Admitting: Family Medicine

## 2011-06-21 VITALS — BP 132/64 | HR 81 | Temp 98.2°F | Wt 109.0 lb

## 2011-06-21 DIAGNOSIS — S32009A Unspecified fracture of unspecified lumbar vertebra, initial encounter for closed fracture: Secondary | ICD-10-CM

## 2011-06-21 DIAGNOSIS — S32000A Wedge compression fracture of unspecified lumbar vertebra, initial encounter for closed fracture: Secondary | ICD-10-CM

## 2011-06-21 DIAGNOSIS — B029 Zoster without complications: Secondary | ICD-10-CM

## 2011-06-21 DIAGNOSIS — R110 Nausea: Secondary | ICD-10-CM

## 2011-06-21 DIAGNOSIS — E871 Hypo-osmolality and hyponatremia: Secondary | ICD-10-CM

## 2011-06-21 LAB — BASIC METABOLIC PANEL
BUN: 20 mg/dL (ref 6–23)
CO2: 30 mEq/L (ref 19–32)
Calcium: 8.2 mg/dL — ABNORMAL LOW (ref 8.4–10.5)
Creatinine, Ser: 0.6 mg/dL (ref 0.4–1.2)
GFR: 103.55 mL/min (ref >60.00)
Glucose, Bld: 101 mg/dL — ABNORMAL HIGH (ref 70–99)
Sodium: 131 mEq/L — ABNORMAL LOW (ref 135–145)

## 2011-06-21 MED ORDER — PROMETHAZINE HCL 12.5 MG RE SUPP: 12.5000 mg | Freq: Four times a day (QID) | RECTAL | Status: AC | PRN

## 2011-06-21 MED ORDER — GABAPENTIN 300 MG PO CAPS: 300.0000 mg | ORAL_CAPSULE | Freq: Three times a day (TID) | ORAL | Status: DC

## 2011-06-21 MED ORDER — HYDROMORPHONE HCL 2 MG PO TABS: 2.0000 mg | ORAL_TABLET | ORAL | Status: DC | PRN

## 2011-06-21 MED ORDER — HYDROMORPHONE HCL 2 MG PO TABS: 2.0000 mg | ORAL_TABLET | Freq: Four times a day (QID) | ORAL | Status: DC | PRN

## 2011-06-21 NOTE — Progress Notes (Signed)
Subjective:    Patient ID: Sabrina Moore, female    DOB: 02-11-1927, 77 y.o.   MRN: [id number]  HPI Hospital followup. Patient here with right face shingles with severe pain. Placed on Valtrex and oxycodone but apparently developed some nausea and vomiting. She presented to hospital with severe hyponatremia with sodium 114 felt to be secondary to volume depletion. She had nausea and vomiting felt to be secondary oxycodone. Patient had episode of vomiting and felt sudden pain lower back prior to admission. Acute compression fracture L4 noted on admission.  Hyponatremia resolved with volume resuscitation with sodium 136 at discharge. She has some chronic mild hyponatremia. No diuretic use. Neuropathy pain treated with Dilaudid 2 mg every 8 hours. Patient on gabapentin 200 mg 3 times a day and also taking naproxen 500 mg twice a day. She does take Plavix regularly. Pain is still 7/10 at times. She is also having some mild nausea today but still has good fluid and food intake. Facial rash is healing. No difficulty swallowing.  Very supportive family.  Past Medical History  Diagnosis Date  . IHD (ischemic heart disease)   . Chest discomfort   . Abnormal stress ECG with treadmill 02/01/10  . Dyslipidemia   . Vasovagal syncope   . IDA (iron deficiency anemia)   . IBS (irritable bowel syndrome)   . Fatigue   . Anxiety   . Hypertension   . Hypercholesterolemia    Past Surgical History  Procedure Date  . Coronary artery bypass graft 1996  . Cardiac catheterization 02/09/10  . Appendectomy 1967  . Tonsillectomy 1933  . Oophorectomy 1967  . Eye surgery 2009    catarac  . Squamous cell carcinoma excision 2012    reports that she has never smoked. She does not have any smokeless tobacco history on file. She reports that she does not drink alcohol or use illicit drugs. family history includes Arthritis in her father; Breast cancer in her mother; Cancer (age of onset:93) in her mother; Heart disease  in her father; and Ovarian cancer in her mother. Allergies  Allergen Reactions  . Crestor (Rosuvastatin Calcium)   . Lescol     Gi symptoms  . Lipitor (Atorvastatin Calcium)   . Lovastatin   . Novocain   . Oxycodone Nausea And Vomiting  . Pravachol   . Shellfish Allergy   . Zocor (Simvastatin)       Review of Systems  Constitutional: Negative for fever, chills and unexpected weight change.  HENT: Positive for ear pain. Negative for sore throat, facial swelling and trouble swallowing.   Respiratory: Negative for cough and shortness of breath.   Cardiovascular: Negative for chest pain.  Genitourinary: Negative for dysuria.  Musculoskeletal: Positive for back pain.       Objective:   Physical Exam  Constitutional: She is oriented to person, place, and time. She appears well-developed and well-nourished.  HENT:  Right Ear: External ear normal.  Left Ear: External ear normal.  Mouth/Throat: Oropharynx is clear and moist.  Neck: Neck supple.  Cardiovascular: Normal rate and regular rhythm.   Pulmonary/Chest: Effort normal. No respiratory distress. She has no wheezes. She has no rales.  Musculoskeletal:       Tender lower lumbar around L4-L5  Lymphadenopathy:    She has no cervical adenopathy.  Neurological: She is alert and oriented to person, place, and time. No cranial nerve deficit.       No facial nerve involvement  Skin:  Crusted lesions healing right side of face  Psychiatric: She has a normal mood and affect. Her behavior is normal.          Assessment & Plan:  #1 recent shingles with severe right facial pain. Pain poorly controlled. Titrate gabapentin 300 mg 3 times a day. Refilled Dilaudid 2 mg every 8 hours as needed. Discontinue naproxen with concerns of her prolonged use at her age with Plavix. #2 recent hyponatremia. Recheck basic metabolic panel #3 recent compression fracture L4 probably related to her vomiting. Patient's family requesting referral  for interventional radiology for possible kyphoplasty. She is having significant back pain which is fairly well-controlled with medications above but still 7-8/10 at times

## 2011-06-24 ENCOUNTER — Other Ambulatory Visit (HOSPITAL_COMMUNITY): Payer: Self-pay | Admitting: Interventional Radiology

## 2011-06-24 DIAGNOSIS — S32040A Wedge compression fracture of fourth lumbar vertebra, initial encounter for closed fracture: Secondary | ICD-10-CM

## 2011-06-25 ENCOUNTER — Encounter: Payer: Self-pay | Admitting: Family Medicine

## 2011-06-25 ENCOUNTER — Ambulatory Visit (INDEPENDENT_AMBULATORY_CARE_PROVIDER_SITE_OTHER): Payer: Medicare Other | Admitting: Family Medicine

## 2011-06-25 ENCOUNTER — Emergency Department (HOSPITAL_COMMUNITY)
Admission: EM | Admit: 2011-06-25 | Discharge: 2011-06-26 | Disposition: A | Discharge status: Home / Self Care | Payer: Medicare Other | Attending: Emergency Medicine | Admitting: Emergency Medicine

## 2011-06-25 DIAGNOSIS — E871 Hypo-osmolality and hyponatremia: Secondary | ICD-10-CM

## 2011-06-25 DIAGNOSIS — K589 Irritable bowel syndrome without diarrhea: Secondary | ICD-10-CM | POA: Insufficient documentation

## 2011-06-25 DIAGNOSIS — I10 Essential (primary) hypertension: Secondary | ICD-10-CM | POA: Insufficient documentation

## 2011-06-25 DIAGNOSIS — B029 Zoster without complications: Secondary | ICD-10-CM | POA: Insufficient documentation

## 2011-06-25 DIAGNOSIS — R51 Headache: Secondary | ICD-10-CM | POA: Insufficient documentation

## 2011-06-25 DIAGNOSIS — Z7982 Long term (current) use of aspirin: Secondary | ICD-10-CM | POA: Insufficient documentation

## 2011-06-25 DIAGNOSIS — I252 Old myocardial infarction: Secondary | ICD-10-CM | POA: Insufficient documentation

## 2011-06-25 DIAGNOSIS — E785 Hyperlipidemia, unspecified: Secondary | ICD-10-CM | POA: Insufficient documentation

## 2011-06-25 DIAGNOSIS — Z79899 Other long term (current) drug therapy: Secondary | ICD-10-CM | POA: Insufficient documentation

## 2011-06-25 DIAGNOSIS — B0229 Other postherpetic nervous system involvement: Secondary | ICD-10-CM

## 2011-06-25 NOTE — Patient Instructions (Signed)
Try increasing gabapentin to 600 mg at 4 pm dose and continue with 300 mg dose other doses.Sabrina Moore

## 2011-06-25 NOTE — Progress Notes (Signed)
  Subjective:    Patient ID: Sabrina Moore, female    DOB: July 08, 1927, 77 y.o.   MRN: [id number]  HPI Patient seen for followup. Refer to previous dictation. Recent admission for hyponatremia and shingles with severe neuropathic pain.  We titrated her gabapentin to 300 mg 3 times a day and she still has 7/10 pain at times. Pains tends to be worse around 7 to 10 PM. Other times of day pain is 3/10. She also takes Dilaudid 2 mg 3-4 times daily and supplementing with some Tylenol. She is tapered off naproxen. Most of her pain in his right ear and right face. No facial nerve involvement. Poor appetite. Still has some intermittent nausea.  Recent sodium 131. Hospital discharge 136. Not taking any diuretics. She is taking Phenergan as needed for nausea. No abdominal pain.  Patient also had recent acute compression fracture L4. Back pain is currently rated 5/10 with pain medication. She has scheduled followup with interventional radiology to further assess later this month.   Review of Systems  Constitutional: Positive for fatigue. Negative for fever and chills.  HENT: Positive for ear pain. Negative for hearing loss, sore throat and facial swelling.   Eyes: Negative for visual disturbance.  Respiratory: Negative for shortness of breath.   Cardiovascular: Negative for chest pain and leg swelling.  Genitourinary: Negative for dysuria.  Neurological: Positive for weakness. Negative for headaches.       Objective:   Physical Exam  Constitutional: She is oriented to person, place, and time. She appears well-developed and well-nourished. No distress.  HENT:  Right Ear: External ear normal.  Left Ear: External ear normal.  Mouth/Throat: Oropharynx is clear and moist.  Cardiovascular: Normal rate and regular rhythm.   Pulmonary/Chest: Effort normal and breath sounds normal. No respiratory distress. She has no wheezes. She has no rales.  Lymphadenopathy:    She has no cervical adenopathy.    Neurological: She is alert and oriented to person, place, and time.  Skin:       Healing shingles rash right face. No obvious external ear canal involvement on the right          Assessment & Plan:  #1 shingles right face with significant neuropathy pain. Titrate gabapentin 600 mg at 4 PM dose and continue 300 mg other 2 doses. Continue to supplement with pain medication as needed.  Reassess in 2 weeks #2 recent hyponatremia. She has poor intake which could be exacerbating. We discussed possible dietary supplements such as Ensure. Recheck basic metabolic panel #3 recent compression fracture L4. She has appointment with interventional radiology

## 2011-06-26 ENCOUNTER — Telehealth: Payer: Self-pay | Admitting: *Deleted

## 2011-06-26 DIAGNOSIS — F419 Anxiety disorder, unspecified: Secondary | ICD-10-CM

## 2011-06-26 LAB — BASIC METABOLIC PANEL
BUN: 18 mg/dL (ref 6–23)
Calcium: 8.8 mg/dL (ref 8.4–10.5)
Chloride: 95 mEq/L — ABNORMAL LOW (ref 96–112)
Creatinine, Ser: 0.6 mg/dL (ref 0.4–1.2)
GFR: 96 mL/min (ref >60.00)

## 2011-06-26 MED ORDER — ALPRAZOLAM 0.25 MG PO TABS: 0.2500 mg | ORAL_TABLET | Freq: Two times a day (BID) | ORAL | Status: DC

## 2011-06-26 MED ORDER — HYDROMORPHONE HCL 2 MG PO TABS: ORAL_TABLET | ORAL | Status: DC

## 2011-06-26 NOTE — Telephone Encounter (Signed)
Refill Dilaudid for #60 tablets with no refill.  Does she need gabapentin refilled also?  If so, may refill for gabapentin 300 mg #120 with one refill. Dilaudid has to be "written"/printed out.

## 2011-06-26 NOTE — Telephone Encounter (Signed)
Per Dr Caryl Never, OK to refill Xanax for 3 months Pt daughter informed Rx ready to pick-up

## 2011-06-26 NOTE — Telephone Encounter (Signed)
I spoke with daughter, pt needs more Xanax also.  Has plenty of gabapentin

## 2011-06-26 NOTE — Telephone Encounter (Signed)
Seen in the ER last night and was screaming with pain due to shingles having spread to the scalp.  ER docs put her on Dilaudid 2 mg. 2 q 4 hours with Xanax .25 m.g 1.2 tid and one q hs.  Daughter does not want any of the pain meds reduced at all, and needs new prescriptions.

## 2011-06-27 ENCOUNTER — Emergency Department (HOSPITAL_COMMUNITY)
Admission: EM | Admit: 2011-06-27 | Discharge: 2011-06-27 | Disposition: A | Discharge status: Home / Self Care | Payer: Medicare Other | Attending: Emergency Medicine | Admitting: Emergency Medicine

## 2011-06-27 DIAGNOSIS — G500 Trigeminal neuralgia: Secondary | ICD-10-CM | POA: Insufficient documentation

## 2011-06-27 DIAGNOSIS — R51 Headache: Secondary | ICD-10-CM | POA: Insufficient documentation

## 2011-06-27 DIAGNOSIS — R21 Rash and other nonspecific skin eruption: Secondary | ICD-10-CM | POA: Insufficient documentation

## 2011-06-27 DIAGNOSIS — I2510 Atherosclerotic heart disease of native coronary artery without angina pectoris: Secondary | ICD-10-CM | POA: Insufficient documentation

## 2011-06-27 DIAGNOSIS — I10 Essential (primary) hypertension: Secondary | ICD-10-CM | POA: Insufficient documentation

## 2011-07-02 ENCOUNTER — Other Ambulatory Visit (HOSPITAL_COMMUNITY): Payer: Medicare Other

## 2011-07-08 ENCOUNTER — Telehealth: Payer: Self-pay | Admitting: Family Medicine

## 2011-07-08 MED ORDER — LACTULOSE 10 GM/15ML PO SOLN: 20.0000 g | Freq: Two times a day (BID) | ORAL | Status: AC

## 2011-07-08 NOTE — Telephone Encounter (Signed)
Please advise 

## 2011-07-08 NOTE — Telephone Encounter (Signed)
Pts daughter rscd pt ov that was sch on 07/10/11, because pt is not feeling well. Pt has rsc for 10/8 and is needing to get a refill of Lactulose 10 gm/15 ml solution 1 tbsp per day prn for constipation. This med was prescribed to pt by ER. Pls call in refill to [facility] [address]

## 2011-07-08 NOTE — Telephone Encounter (Signed)
May refill once. 

## 2011-07-10 ENCOUNTER — Ambulatory Visit: Payer: Medicare Other | Admitting: Family Medicine

## 2011-07-15 ENCOUNTER — Emergency Department (HOSPITAL_COMMUNITY): Payer: Medicare Other

## 2011-07-15 ENCOUNTER — Inpatient Hospital Stay (HOSPITAL_COMMUNITY)
Admission: EM | Admit: 2011-07-15 | Discharge: 2011-07-22 | DRG: 897 | Disposition: A | Discharge status: Skilled Nursing Facility | Payer: Medicare Other | Attending: Internal Medicine | Admitting: Internal Medicine

## 2011-07-15 DIAGNOSIS — R7401 Elevation of levels of liver transaminase levels: Secondary | ICD-10-CM | POA: Diagnosis not present

## 2011-07-15 DIAGNOSIS — I10 Essential (primary) hypertension: Secondary | ICD-10-CM | POA: Diagnosis present

## 2011-07-15 DIAGNOSIS — Z79899 Other long term (current) drug therapy: Secondary | ICD-10-CM

## 2011-07-15 DIAGNOSIS — E785 Hyperlipidemia, unspecified: Secondary | ICD-10-CM | POA: Diagnosis present

## 2011-07-15 DIAGNOSIS — R7402 Elevation of levels of lactic acid dehydrogenase (LDH): Secondary | ICD-10-CM | POA: Diagnosis not present

## 2011-07-15 DIAGNOSIS — R5381 Other malaise: Secondary | ICD-10-CM | POA: Diagnosis present

## 2011-07-15 DIAGNOSIS — F19921 Other psychoactive substance use, unspecified with intoxication with delirium: Principal | ICD-10-CM | POA: Diagnosis present

## 2011-07-15 DIAGNOSIS — K589 Irritable bowel syndrome without diarrhea: Secondary | ICD-10-CM | POA: Diagnosis present

## 2011-07-15 DIAGNOSIS — R627 Adult failure to thrive: Secondary | ICD-10-CM | POA: Diagnosis present

## 2011-07-15 DIAGNOSIS — Y92009 Unspecified place in unspecified non-institutional (private) residence as the place of occurrence of the external cause: Secondary | ICD-10-CM

## 2011-07-15 DIAGNOSIS — F411 Generalized anxiety disorder: Secondary | ICD-10-CM | POA: Diagnosis present

## 2011-07-15 DIAGNOSIS — E871 Hypo-osmolality and hyponatremia: Secondary | ICD-10-CM | POA: Diagnosis present

## 2011-07-15 DIAGNOSIS — K224 Dyskinesia of esophagus: Secondary | ICD-10-CM | POA: Diagnosis present

## 2011-07-15 DIAGNOSIS — Z951 Presence of aortocoronary bypass graft: Secondary | ICD-10-CM

## 2011-07-15 DIAGNOSIS — R1310 Dysphagia, unspecified: Secondary | ICD-10-CM | POA: Diagnosis present

## 2011-07-15 DIAGNOSIS — K5900 Constipation, unspecified: Secondary | ICD-10-CM | POA: Diagnosis present

## 2011-07-15 DIAGNOSIS — I2510 Atherosclerotic heart disease of native coronary artery without angina pectoris: Secondary | ICD-10-CM | POA: Diagnosis present

## 2011-07-15 DIAGNOSIS — B0229 Other postherpetic nervous system involvement: Secondary | ICD-10-CM | POA: Diagnosis present

## 2011-07-15 DIAGNOSIS — T4275XA Adverse effect of unspecified antiepileptic and sedative-hypnotic drugs, initial encounter: Secondary | ICD-10-CM | POA: Diagnosis present

## 2011-07-15 DIAGNOSIS — Z7982 Long term (current) use of aspirin: Secondary | ICD-10-CM

## 2011-07-15 DIAGNOSIS — Z7902 Long term (current) use of antithrombotics/antiplatelets: Secondary | ICD-10-CM

## 2011-07-15 DIAGNOSIS — R110 Nausea: Secondary | ICD-10-CM | POA: Diagnosis present

## 2011-07-15 DIAGNOSIS — M6250 Muscle wasting and atrophy, not elsewhere classified, unspecified site: Secondary | ICD-10-CM | POA: Diagnosis present

## 2011-07-15 LAB — URINALYSIS, ROUTINE W REFLEX MICROSCOPIC
Glucose, UA: NEGATIVE mg/dL
Leukocytes, UA: NEGATIVE
Protein, ur: NEGATIVE mg/dL
Specific Gravity, Urine: 1.007 (ref 1.005–1.030)
Urobilinogen, UA: 0.2 mg/dL (ref 0.0–1.0)

## 2011-07-15 LAB — DIFFERENTIAL
Basophils Absolute: 0 10*3/uL (ref 0.0–0.1)
Basophils Relative: 0 % (ref 0–1)
Eosinophils Absolute: 0.3 10*3/uL (ref 0.0–0.7)
Eosinophils Relative: 5 % (ref 0–5)
Monocytes Absolute: 0.6 10*3/uL (ref 0.1–1.0)
Monocytes Relative: 9 % (ref 3–12)
Neutro Abs: 4.6 10*3/uL (ref 1.7–7.7)

## 2011-07-15 LAB — COMPREHENSIVE METABOLIC PANEL
Albumin: 3.1 g/dL — ABNORMAL LOW (ref 3.5–5.2)
BUN: 13 mg/dL (ref 6–23)
Calcium: 8.5 mg/dL (ref 8.4–10.5)
Chloride: 96 mEq/L (ref 96–112)
Creatinine, Ser: 0.49 mg/dL — ABNORMAL LOW (ref 0.50–1.10)
GFR calc non Af Amer: 88 mL/min — ABNORMAL LOW (ref >90)
Total Bilirubin: 0.2 mg/dL — ABNORMAL LOW (ref 0.3–1.2)

## 2011-07-15 LAB — CBC
Hemoglobin: 10 g/dL — ABNORMAL LOW (ref 12.0–15.0)
MCH: 25.6 pg — ABNORMAL LOW (ref 26.0–34.0)
MCHC: 31.8 g/dL (ref 30.0–36.0)
RDW: 14.5 % (ref 11.5–15.5)

## 2011-07-15 LAB — POCT I-STAT TROPONIN I: Troponin i, poc: 0 ng/mL (ref 0.00–0.08)

## 2011-07-16 LAB — URINE CULTURE

## 2011-07-16 LAB — TSH: TSH: 0.431 u[IU]/mL (ref 0.350–4.500)

## 2011-07-16 LAB — CARBAMAZEPINE LEVEL, TOTAL: Carbamazepine Lvl: 2.7 ug/mL — ABNORMAL LOW (ref 4.0–12.0)

## 2011-07-17 LAB — FOLATE: Folate: >20 ng/mL

## 2011-07-17 LAB — VITAMIN B12: Vitamin B-12: 1106 pg/mL — ABNORMAL HIGH (ref 211–911)

## 2011-07-18 ENCOUNTER — Telehealth: Payer: Self-pay | Admitting: Family Medicine

## 2011-07-18 ENCOUNTER — Inpatient Hospital Stay (HOSPITAL_COMMUNITY)
Admit: 2011-07-18 | Discharge: 2011-07-18 | Disposition: A | Discharge status: Home / Self Care | Payer: Medicare Other | Attending: Internal Medicine | Admitting: Internal Medicine

## 2011-07-18 NOTE — Telephone Encounter (Signed)
Pts spouse called. Pt was admitted to [facility] and is not doing well. Pt can't walk, eat, loss control of kidney function and bowell function. Pts spouse said that the hospitalist is saying that he is discharging pt today.

## 2011-07-18 NOTE — Procedures (Signed)
REFERRING PHYSICIAN:  Melvyn Novas, MD  HISTORY:  An 77 year old female with confusion and post herpetic neuralgia.  MEDICATIONS:  Protonix, Elavil, Lovenox, Plavix, Claritin, Lovaza, aspirin, Toprol, Pepcid, topical lidocaine, and Xanax.  CONDITIONS OF RECORDING:  This is a 16 channel EEG carried out with the patient in the awake state.  DESCRIPTION:  The waking background activity consists of a low-voltage symmetrical fairly well-organized 8 Hz alpha activity seen from the parietooccipital and posttemporal regions.  Low-voltage fast activity poorly organized was seen anteriorly and at times superimposed on more posterior rhythms.  A mixture of theta and alpha seen from the central and temporal regions.  The patient does not drowses sleep. Hypoventilation and intermittent photic stimulation were both not performed.  IMPRESSION:  This is a normal awake EEG.  No epileptiform activity is noted.  COMMENT:  An EEG, the patient is sleep deprived to elicit drowsy and light sleep, maybe desirable to further elicit a possible seizure disorder.          ______________________________ Thana Farr, MD    NW:GNFA D:  07/18/2011 19:04:15  T:  07/18/2011 20:57:27  Job #:  [id number]

## 2011-07-19 ENCOUNTER — Inpatient Hospital Stay (HOSPITAL_COMMUNITY): Payer: Medicare Other

## 2011-07-19 LAB — COMPREHENSIVE METABOLIC PANEL
ALT: 17 U/L (ref 0–35)
Alkaline Phosphatase: 81 U/L (ref 39–117)
CO2: 30 mEq/L (ref 19–32)
Chloride: 94 mEq/L — ABNORMAL LOW (ref 96–112)
Glucose, Bld: 103 mg/dL — ABNORMAL HIGH (ref 70–99)
Potassium: 3.8 mEq/L (ref 3.5–5.1)
Sodium: 131 mEq/L — ABNORMAL LOW (ref 135–145)
Total Protein: 6.1 g/dL (ref 6.0–8.3)

## 2011-07-19 LAB — DIFFERENTIAL
Basophils Absolute: 0 10*3/uL (ref 0.0–0.1)
Basophils Relative: 1 % (ref 0–1)
Neutro Abs: 1.7 10*3/uL (ref 1.7–7.7)
Neutrophils Relative %: 42 % — ABNORMAL LOW (ref 43–77)

## 2011-07-19 LAB — CBC
Hemoglobin: 10.5 g/dL — ABNORMAL LOW (ref 12.0–15.0)
MCHC: 32.6 g/dL (ref 30.0–36.0)
RBC: 4.11 MIL/uL (ref 3.87–5.11)
WBC: 4 10*3/uL (ref 4.0–10.5)

## 2011-07-20 LAB — DIFFERENTIAL
Basophils Absolute: 0 10*3/uL (ref 0.0–0.1)
Eosinophils Relative: 9 % — ABNORMAL HIGH (ref 0–5)
Lymphocytes Relative: 26 % (ref 12–46)
Monocytes Absolute: 0.6 10*3/uL (ref 0.1–1.0)

## 2011-07-20 LAB — COMPREHENSIVE METABOLIC PANEL
Albumin: 3 g/dL — ABNORMAL LOW (ref 3.5–5.2)
BUN: 11 mg/dL (ref 6–23)
Creatinine, Ser: <0.47 mg/dL — ABNORMAL LOW (ref 0.50–1.10)
Potassium: 3.6 mEq/L (ref 3.5–5.1)
Total Protein: 5.7 g/dL — ABNORMAL LOW (ref 6.0–8.3)

## 2011-07-20 LAB — CBC
HCT: 32.5 % — ABNORMAL LOW (ref 36.0–46.0)
MCHC: 32.6 g/dL (ref 30.0–36.0)
MCV: 78.1 fL (ref 78.0–100.0)
RDW: 14.7 % (ref 11.5–15.5)

## 2011-07-20 LAB — MAGNESIUM: Magnesium: 2.1 mg/dL (ref 1.5–2.5)

## 2011-07-21 LAB — COMPREHENSIVE METABOLIC PANEL
AST: 61 U/L — ABNORMAL HIGH (ref 0–37)
Alkaline Phosphatase: 74 U/L (ref 39–117)
BUN: 11 mg/dL (ref 6–23)
CO2: 30 mEq/L (ref 19–32)
Chloride: 92 mEq/L — ABNORMAL LOW (ref 96–112)
Creatinine, Ser: 0.49 mg/dL — ABNORMAL LOW (ref 0.50–1.10)
GFR calc non Af Amer: 88 mL/min — ABNORMAL LOW (ref >90)
Total Bilirubin: 0.1 mg/dL — ABNORMAL LOW (ref 0.3–1.2)

## 2011-07-21 LAB — DIFFERENTIAL
Lymphocytes Relative: 30 % (ref 12–46)
Lymphs Abs: 1.2 10*3/uL (ref 0.7–4.0)
Neutrophils Relative %: 43 % (ref 43–77)

## 2011-07-21 LAB — CBC
HCT: 31.4 % — ABNORMAL LOW (ref 36.0–46.0)
MCV: 77.7 fL — ABNORMAL LOW (ref 78.0–100.0)
Platelets: 249 10*3/uL (ref 150–400)
RBC: 4.04 MIL/uL (ref 3.87–5.11)
WBC: 4 10*3/uL (ref 4.0–10.5)

## 2011-07-22 ENCOUNTER — Ambulatory Visit: Payer: Medicare Other | Admitting: Family Medicine

## 2011-07-22 LAB — MAGNESIUM: Magnesium: 2.2 mg/dL (ref 1.5–2.5)

## 2011-07-22 LAB — COMPREHENSIVE METABOLIC PANEL
ALT: 51 U/L — ABNORMAL HIGH (ref 0–35)
Albumin: 2.9 g/dL — ABNORMAL LOW (ref 3.5–5.2)
Alkaline Phosphatase: 77 U/L (ref 39–117)
BUN: 12 mg/dL (ref 6–23)
Potassium: 3.8 mEq/L (ref 3.5–5.1)
Sodium: 130 mEq/L — ABNORMAL LOW (ref 135–145)
Total Protein: 5.6 g/dL — ABNORMAL LOW (ref 6.0–8.3)

## 2011-07-22 LAB — DIFFERENTIAL
Basophils Absolute: 0 10*3/uL (ref 0.0–0.1)
Basophils Relative: 1 % (ref 0–1)
Eosinophils Relative: 10 % — ABNORMAL HIGH (ref 0–5)
Monocytes Absolute: 0.7 10*3/uL (ref 0.1–1.0)

## 2011-07-22 LAB — HEPATITIS A ANTIBODY, IGM: Hep A IgM: NEGATIVE

## 2011-07-22 LAB — CBC
MCHC: 32.3 g/dL (ref 30.0–36.0)
RDW: 14.9 % (ref 11.5–15.5)

## 2011-07-22 NOTE — Discharge Summary (Signed)
  NAMEWILLEEN, Sabrina Moore NO.:  [id number]  MEDICAL RECORD NO.:  [id number]  LOCATION:  1432                         FACILITY:  [facility]  PHYSICIAN:  Talmage Nap, MD  DATE OF BIRTH:  [birth year]  DATE OF ADMISSION:  07/15/2011 DATE OF DISCHARGE:  07/22/2011                        DISCHARGE SUMMARY - REFERRING   ADDENDUM:  For discharge summary and hospital course, refer to my discharge summary dictated on July 21, 2011.  The patient was, however, seen by me today which is October 8th, claims she had a wonderful night.  Denies any complaints.  PHYSICAL EXAMINATION:  GENERAL:  The patient was essentially unremarkable. VITAL SIGNS:  Blood pressure is 96/54, pulse 83, respiratory rate 18, temperature is 98.2.  LABORATORY DATA:  Labs for July 22, 2011, include complete blood count with differential which showed WBC of 4.7, hemoglobin of 10.2, hematocrit of 31.6, MCV of 78.8, platelet count of 269.  Comprehensive metabolic panel showed sodium of 130, potassium of 3.8, chloride of 94 with a bicarbonate of 34, glucose is 95, BUN is 12, creatinine is 0.47. Magnesium level is 2.2.  Hepatitis profile A, B, and C are negative.  PLAN:  The patient is medically stable.  Plan is for the patient to be discharged to rehab facility today.  She will be on the following medications; 1. Alprazolam 0.25 mg p.o. q.8 p.r.n. 2. Amlodipine 10 mg 1 p.o. daily. 3. Ensure Clinical Homemade Vanilla 237 meals by mouth b.i.d. with     meals. 4. Hyoscyamine 0.125 mg, that is Levsin 1 p.o. t.i.d. 5. Lidocaine 5% patch transdermal q.24 hours. 6. Polyethylene glycol 3350 powder (MiraLax 17 g) 1 p.o. daily p.r.n. 7. Pregabalin 75 mg, Lyrica 1 tablet taken twice a day; first tablet     to be taken at 8:00 a.m. and second tablet to be taken at 8:00 p.m. 8. Enteric-coated aspirin 81 mg 1 p.o. daily. 9. CoQ10 over-the-counter 1 p.o. daily. 10.Dilaudid 2 mg 1 p.o. q.4 p.r.n. 11.Fish oil 1  capsule p.o. daily. 12.Nitroglycerin sublingual 0.4 mg q.5 minutes x3 doses p.r.n., chest     pain. 13.Pantoprazole 40 mg 1 p.o. daily at bedtime. 14.Pepcid (famotidine) over-the-counter 2 tablets p.o. b.i.d. 15.Plavix (clopidogrel) 75 mg 1 p.o. q.a.m. 16.Toprol-XL (metoprolol succinate) 25 mg 1/2 a tablet p.o. daily. 17.vitamin E topical apply p.r.n.     Talmage Nap, MD     CN/MEDQ  D:  07/22/2011  T:  07/22/2011  Job:  [id number]  cc:   Evelena Peat, M.D.  Electronically Signed by Talmage Nap  on 07/22/2011 04:41:31 PM

## 2011-07-22 NOTE — Discharge Summary (Signed)
Sabrina Moore, Sabrina Moore                 ACCOUNT NO.:  [id number]  MEDICAL RECORD NO.:  [id number]  LOCATION:  1432                         FACILITY:  [facility]  PHYSICIAN:  Talmage Nap, MD  DATE OF BIRTH:  September 08, 1927  DATE OF ADMISSION:  07/15/2011 DATE OF DISCHARGE:                        DISCHARGE SUMMARY - REFERRING   PRIMARY CARE PHYSICIAN:  Dr. Evelena Peat.  PRIMARY NEUROLOGIST:  Dr. Donnal Debar.  CONSULTANT INVOLVED IN THE CASE:  Neurology, Dr. Thad Ranger.  DISCHARGE DIAGNOSES: 1. Acute delirium most likely secondary to medication- resolved. 2. Postherpetic neuralgia, right side of the face. 3. Esophageal spasm. 4. Abnormal LFT. 5. History of coronary artery disease, status post coronary artery     bypass graft. 6. Hyponatremia. 7. Dyslipidemia. 8. Anxiety disorder. 9. Irritable bowel syndrome. 10.History of vitamin D deficiency. 11.History of compression fracture of L4. 12.Deconditioning.  The patient is an 76 year old Caucasian female was initially discharged from the hospital on June 17, 2011 after being treated for nausea and vomiting with hyponatremia and at the same time, postherpetic neuralgia.  She however, presented to the hospital on July 15, 2011 with complaints of deteriorating mentation.  Baseline, the patient is awake, alert, and oriented, and able to carry out activities of daily living without any difficulty, but it was observed that since the patient was discharged from the hospital and she was given Dilaudid, carbamazepine, and amitriptyline for postherpetic neuralgia.  Her mentation had dramatically taken a turn for the worse.  She was said to be becoming more confused, especially in the morning and not able to recognize family members.  Appetite has been said to be poor without symptoms of weight.  There is however, no history of fever.  There is no history of chills and no history of rigor.  The patient however, was said to be  deteriorating, not a very alarming rate, hence she was brought to the hospital for evaluation.  Her preadmission medications include, 1. Dilaudid 2 mg q.4 h. p.r.n. 2. Baclofen 10 mg p.o. t.i.d. 3. Alprazolam 0.25 mg p.o. t.i.d. and 0.25 mg at night. 4. Plavix 75 mg p.o. daily. 5. Pantoprazole 40 mg p.o. q.h.s. 6. Carbamazepine 100 mg p.o. q.h.s. 7. Metoprolol 25 mg p.o. daily. 8. Amitriptyline 50 mg p.o. q.h.s. 9. Pepcid AC 2 tablets p.o. daily. 10.Aspirin 81 mg p.o. daily.  ALLERGIES: 1. NOVOCAIN. 2. OXYCODONE. 3. STATINS. 4. LESCOL. 5. SHELLFISH.  SOCIAL HISTORY:  Negative for alcohol or tobacco use.  The patient lives at home with her spouse and has a very supportive daughter and granddaughter.  FAMILY HISTORY:  Positive for coronary artery disease and ovarian CA.  REVIEW OF SYSTEMS:  Essentially documented in the initial history and physical.  PHYSICAL EXAMINATION:  GENERAL:  At the time, the patient was seen by the admitting physician.  She was said to be alert, not in any distress. VITAL SIGNS:  Blood pressure is 110/62, temperature is 98.3, respiratory rate 20, and pulse 74. HEENT:  Pupils were reactive to light and extraocular muscles are intact. NECK:  She had no jugular venous distention.  No carotid bruit, no lymphadenopathy. CHEST:  Clear to auscultation. HEART:  Heart sounds are 1 and 2. ABDOMEN:  Said to be soft and nontender.  Liver, spleen, and kidney not palpable.  Bowel sounds are positive. EXTREMITIES:  No pedal edema. NEUROLOGIC:  Did not show any new lateralizing signs. NEUROPSYCHIATRIC:  Evaluation showed the patient to be anxious, but good affect. SKIN:  Showed decreased turgor. MUSCULOSKELETAL SYSTEM:  Showed arthritic changes in the knees and the feet with questionable muscle wasting in the lower extremities.  LABORATORY DATA:  Initial complete blood count with differential showed WBC of 6.2, hemoglobin of 10.0, hematocrit of 31.4, MCV of  80.5 with a platelet count of 224.  Cardiac marker, first set troponin I 0.00. Comprehensive metabolic panel showed sodium of 133, potassium of 4.0, chloride of 96 with a bicarbonate of 30, glucose is 118, BUN is 13, and creatinine 0.49.  LFTs showed total bilirubin 0.2, alkaline phosphatase 80, AST 17, and ALT 10.  Urinalysis unremarkable.  TSH 0.431 and normal. Tegretol or carbamazepine level is 2.7 low.  Urine culture showed multiple bacterial morphotypes, none predominant.  Vitamin B12 of 1106, elevated.  Folate level greater than 20.  A repeat complete blood count with differential done on July 21, 2011, showed WBCs of 4.0, hemoglobin of 10.3, hematocrit of 31.4, MCV of 77.7, platelet count of 249, neutrophils 17, eosinophils 10.  Comprehensive metabolic panel showed sodium of 127, potassium of 3.9, chloride of 92, bicarbonate is 30, glucose is 105, BUN is 11, and creatinine 0.49.  LFTs showed total bilirubin 0.1, alkaline phosphatase 74, AST 61, ALT 49, and magnesium level is 2.1.  IMAGING STUDIES:  Done include CT of the head without contrast and it showed chronic atrophic changes.  No acute or reversible findings seen. Barium swallow (esophagogram) showed a nonspecific esophageal dysmotility disorder with peristaltic contraction/spasm.  No findings of herpes esophagitis and there is small hiatal hernia.  EEG was said to be normal.  No epileptiform activity seen.  HOSPITAL COURSE:  The patient was admitted to Telemetry with an impression of altered mental status, started on normal saline IV with 20 mEq of KCl to go at rate of 125 cc an hour.  She was also given SCD boot as well as Lovenox for DVT prophylaxis.  In addition, the patient was given Zofran, Mylanta, and MiraLax for nausea and also for constipation respectively and initial pain control was done with Tylenol.  Other medications given to the patient include Dilaudid 1 mg p.o. q.6 h. p.r.n., Xanax 0.2 mg p.o. q.8 h.  p.r.n., Protonix 40 mg p.o. q.h.s., carbamazepine 100 mg p.o. q.h.s., and Lyrica 50 mg p.o. b.i.d.  On July 16, 2011, Lyrica was discontinued and the patient's neuropathic pain on the right side of the face was managed with Lidoderm patch. Neurology was consulted because of the intractable pain and unpleasant mentation and I recommended EEG, which was essentially normal and for all antipsychotic medication to be discontinued including amitriptyline. The patient was however, seen by me for the very first time in this admission on July 18, 2011 and during this encounter, the patient continued to complain about sternal discomfort, particularly when eating and examination of the patient was unremarkable and subsequently, Carafate suspension was added to the patient's regimen and this was eventually changed to tablet.  The patient however, continued to complain of retrosternal discomfort and esophagogram was ordered and the results showed motility disorder with questionable spasm.  At this point, Carafate was discontinued and the patient was placed on Norvasc 10 mg p.o. daily and Levsin 0.125 mg p.o. t.i.d.  Her postherpetic  neuralgia pain was managed with Lyrica 75 mg p.o. q.h.s.  So far, the patient has made remarkable progress.  Initially, the patient was not able to tolerate any regular diet, but on July 20, 2011, the patient's diet was advanced to regular diet, which she is able to tolerate.  The patient was evaluated by me today on July 20, 2011.  Denies any specific complaint.  However, family members have concerned about the abnormal LFT and after extensive discussion, hepatitis panel will be ordered to rule out hepatitis.  Examination of the patient today on July 21, 2011 is unremarkable.  Vital signs, blood pressure 105/66, temperature is 98.4, respiratory rate 18, and pulse is 90.  Plan is to order hepatitis panel because of the abnormal LFT and the dose of Lyrica will be  changed to Lyrica 75 mg p.o. at a.m. and 75 mg p.o. q.h.s.  The patient will be followed and evaluated on daily basis.  At the time of discharge, medications will be dictated.  But for now, the patient is on this current medications, 1. Amlodipine 10 mg p.o. daily. 2. Aspirin 81 mg p.o. daily. 3. Plavix 75 mg p.o. daily. 4. Lovenox 40 mg subcutaneously q.24 h. 5. Ensure 227 mL p.o. b.i.d. with meals. 6. Famotidine 40 mg p.o. b.i.d. 7. Hyoscyamine sulfate (Levsin 0.125 mg p.o. t.i.d.). 8. Lidoderm patch, topically applied q.24 h. to the right side of     face. 9. Loratadine 10 mg p.o. daily. 10.Metoprolol 12.5 mg p.o. daily. 11.Lovaza 1 g p.o. daily. 12.Pantoprazole 40 mg p.o. q.h.s. 13.Lyrica 75 mg p.o. q.h.s. 14.Alprazolam 0.125 mg p.o. q.h.s. 15.For now, Tylenol will be discontinued as well as Dilaudid.  The patient will be followed and evaluated on day-to-day basis.     Talmage Nap, MD     CN/MEDQ  D:  07/21/2011  T:  07/21/2011  Job:  [id number]  Electronically Signed by Talmage Nap  on 07/22/2011 07:00:38 AM

## 2011-07-25 NOTE — Discharge Summary (Signed)
Sabrina Moore, [name]:  [id number]  MEDICAL RECORD NO.:  [id number]  LOCATION:                                 FACILITY:  PHYSICIAN:  Zannie Cove, MD     DATE OF BIRTH:  1927-06-24  DATE OF ADMISSION: DATE OF DISCHARGE:                              DISCHARGE SUMMARY   PRIMARY CARE PHYSICIAN:  Evelena Peat, MD  CARDIOLOGIST:  Cassell Clement, MD  GASTROENTEROLOGIST:  Everardo All. Madilyn Fireman, MD  DISCHARGE DIAGNOSES: 1. Hyponatremia, felt to be from volume depletion, resolved. 2. Nausea and vomiting secondary to narcotics, resolved. 3. Herpes zoster/shingles outbreak in the face, improved. 4. Acute L4 compression fracture. 5. Chronic intermittent diarrhea, suspect irritable bowel syndrome,     colonoscopy pending. 6. History of coronary artery disease, status post coronary artery     bypass grafting in 1996 and percutaneous coronary intervention x2     in May 2012. 7. History of dyslipidemia. 8. History of irritable bowel syndrome. 9. Vitamin D deficiency. 10.Anxiety.  DISCHARGE MEDICATIONS: 1. Gabapentin 200 mg p.o. t.i.d. for 10 days. 2. Dilaudid 2 mg p.o. q.4 h. p.r.n. 3. Naproxen 500 mg p.o. b.i.d. for 5 days. 4. Aspirin 81 mg daily. 5. Xanax 0.5 mg tablet 1/2-1 tablet p.o. b.i.d. p.r.n. for anxiety. 6. Calcium 600 mg 1 tablet daily. 7. CoQ10 one tablet daily. 8. Fish oil over the counter 1 capsule daily. 9. Loratadine 10 mg daily. 10.Magnesium over the counter 1 tablet daily. 11.Benefiber 1 tablet b.i.d. 12.Nasonex nasal 1 spray inhaled nightly p.r.n. 13.Sublingual nitroglycerin 0.4 mg tablet under the tongue q.5 minutes     up to 3 units as needed. 14.Protonix 40 mg daily. 15.Plavix 75 mg daily. 16.Toprol-XL 25 mg 1/2 tablet daily. 17.Tylenol Extra Strength 500 mg p.o. nightly. 18.Valtrex 1 g p.o. t.i.d. for 2 more days. 19.Vitamin B complex 1 tablet daily. 20.Whey protein 1 tablet p.o. daily as needed.  DIAGNOSTICS AND  INVESTIGATIONS: 1. CT of the head on June 12, 2011, stable, mild age-related     cerebral atrophy, ventriculomegaly, and periventricular white     matter disease, no acute intracranial findings. 2. CT of abdomen and pelvis showed:     a.     No acute intraabdominal or pelvic findings.     b.     Stable advanced atherosclerotic disease involving aorta and      blood vessels.  No aneurysm or dissection.     c.     Bladder distention.     d.     Possible acute compression fracture of L4.  HOSPITAL COURSE:  Sabrina Moore is an 77 year old pleasant female with history of CAD, suspected irritable bowel syndrome, was in her usual state of health who presented to the hospital with intense pain and vesicular rash in the right side of her face consistent with shingles in addition to nausea, vomiting, and hyponatremia.  She has just been recently started on oxycodone for her shingles pain which probably caused her nausea and vomiting which subsequently resolved. 1. For her hyponatremia, this was felt to be secondary to volume     depletion as it completely resolved with volume resuscitation  and     sodium is 136 at the time of discharge. 2. Nausea and vomiting, likely secondary to her oxycodone.  This had     also resolved by the second day of hospital stay. 3. Facial shingles.  Started on Valtrex as well as Neurontin for pain     control.  Relatively good pain control was achieved with the     current dose of Neurontin 200 mg p.o. 3 times a day, which I     suspect she may require for another 10 more days. 4. L4 compression fracture.  This was probably sustained in the last     week when she reports that she felt a pop in her back when she was     trying to grab a trash can to vomit and subsequently the CT scan     did show possible L4 compression fracture.  Her symptoms and signs     of pain is consistent with this.  She was started on low-dose     narcotics, NSAIDs, and muscle relaxers for pain  control.     Subsequently, the patient felt that the Tresa Garter was making her too     sleepy, hence we discontinued this and continued Aleve since she     had normal kidney function for a few days with a plan to continue     NSAIDs for 5 days and use Dilaudid p.o. p.r.n. which has been     working well in terms of pain control.  In addition, she has also     been seen by Physical Therapy who has been working with her.  She     has been ambulating with a rolling walker in the hospital halls and     she is being discharged home with home health physical therapy. 5. CAD, stable.  Continued on Plavix. 6. History of chronic intermittent diarrhea.  This was going on for     over a year.  I suspect underlying IBS.  She has seen Dr. Madilyn Fireman and     had an unremarkable endoscopy in the past.  Could consider a     colonoscopy which if unremarkable in her care would likely suggest     a diagnosis of IBS.  DISCHARGE CONDITION:  Stable.  VITAL SIGNS:  Temp is 98.4, pulse 74, blood pressure 148/67, respirations 18, and saturating 95% on room air.  DISCHARGE LABORATORY DATA:  None today, but the last labs showed a sodium of 136, potassium 3.7, chloride 99, bicarb 33, BUN 8, creatinine 0.4, glucose 99.  DISCHARGE FOLLOWUP: 1. With Dr. Evelena Peat in 1 week. 2. Dr. Dorena Cookey with GI for possible evaluation colonoscopy in 1     month.     Zannie Cove, MD     PJ/MEDQ  D:  06/17/2011  T:  06/17/2011  Job:  [id number]  cc:   Evelena Peat, M.D. John C. Madilyn Fireman, M.D.  Electronically Signed by Zannie Cove  on 07/25/2011 02:13:32 PM

## 2011-07-25 NOTE — H&P (Signed)
Sabrina Moore, Sabrina Moore                 ACCOUNT NO.:  [id number]  MEDICAL RECORD NO.:  [id number]  LOCATION:  [facility]                         FACILITY:  [facility]  PHYSICIAN:  Zannie Cove, MD     DATE OF BIRTH:  1927/03/23  DATE OF ADMISSION:  06/12/2011 DATE OF DISCHARGE:                             HISTORY & PHYSICAL   PRIMARY CARE PHYSICIAN:  Dr. Evelena Peat.  CARDIOLOGIST:  Cassell Clement, MD.  CHIEF COMPLAINT:  Nausea, vomiting and back pain as well as shingles.  HISTORY OF PRESENT ILLNESS:  Sabrina Moore is an 77 year old white female with prior history of suspected irritable bowel syndrome, CAD status post CABG, PCI was  in her usual state of health up until close to a week ago.  Last Wednesday, she started noticing intense pain in the right side of her face with tingling, numbness sensations in her face as well.  Subsequently, went to see her primary care doctor and then at that time she did not have a rash, by Monday, she developed a rash on the right side of her face, went to see Dr. Caryl Never and she was treated for shingles outbreak with oxycodone and Valtrex.  Subsequently, has been having nausea since then.  She does have some amount of intermittent nausea with diarrhea or constipation at baseline suspected to be from IBS, however, was having much more intense nausea for the last 2 days, and yesterday was unbearable, has not been able to take anything p.o. yesterday and then was grabbed the trash can because she felt like she wanted to vomit and while trying to do so she felt something in her back popped and has been having back pain as well since.  Subsequently, came to the emergency department, over here she had a CT of her abdomen and pelvis done which was unremarkable and was found to have a sodium level of 114 and [facility] hospitalist were consulted for further evaluation and management.  PAST MEDICAL HISTORY: 1. CAD status post CABG in 1996 and PCI x2 in May  2011. 2. History of dyslipidemia. 3. History of IBS. 4. History vitamin D deficiency.Marland Kitchen  MEDICATIONS:  Include 1. Whey protein 1 tablet daily as needed. 2. Protonix 40 mg daily. 3. Plavix 75 mg daily. 4. Sublingual nitroglycerin 0.4 mg p.r.n. 5. Nasonex 1 spray daily nightly p.r.n. 6. Extra Strength Tylenol 500 mg daily nightly. 7. Oxycodone 5 mg q.6 h. p.r.n. 8. Valtrex 1 g p.o. t.i.d. 9. Vitamin B complex 1 tablet daily. 10.Toprol-XL 25 mg half tablet daily. 11.MetaFiber over-the-counter 1 tablet b.i.d. 12.Magnesium over-the-counter 1 tablet daily. 13.Loratadine 10 mg daily. 14.Fish oil over-the-counter 1 capsule daily. 15.Co-Q 10 one tablet daily. 16.Calcium 600 mg p.o. daily. 17.Enteric-coated aspirin 81 mg daily. 18.Alprazolam 0.25 mg half to 1 tablet p.o. b.i.d. p.r.n.  ALLERGIES:  Include HYDROCODONE, ERYTHROMYCIN BASE, NOVOCAINE AND SHELLFISH.  SOCIAL HISTORY:  She is married, lives at home with her husband.  No history of alcohol or tobacco use.  FAMILY HISTORY:  Mother deceased from ovarian cancer and father deceased in his 41s from heart disease.  REVIEW OF SYSTEMS:  Positive for weight loss which has since stabilized.  Review of  laboratory data shows a hemoglobin of 10.9, white count and platelet count pending.  Sodium 114, potassium 3.2, chloride 81, bicarb 25, BUN 8, creatinine 0.4, glucose 114.  LFTs:  AST, ALT, alk phos normal.  Total protein 5.4, calcium 8.2, lipase 38, troponin I negative. Urine is clear.  PHYSICAL EXAMINATION:  VITAL SIGNS:  Temperature is 98.6, pulse 91, blood pressure 103/76, respirations 18, satting 97% on room air. GENERAL:  This is a thinly built white female lying in the stretcher in no acute distress. SKIN:  She has an erythematous rash on her face without any obvious vescicular lesions at this time. HEENT:  Oral mucosa is dry. NECK:  No JVD or lymphadenopathy. CARDIOVASCULAR SYSTEM:  S1 and S2, regular rate and  rhythm. LUNGS:  Clear to auscultation bilaterally. ABDOMEN:  Soft, nontender with normal bowel sounds.  No organomegaly. EXTREMITIES:  With no edema, clubbing or cyanosis.  IMAGES:  CT of the head shows mild age-related cerebral atrophy, no acute intracranial findings.  CT abdomen and pelvis, no acute abdominal or pelvic findings, stable advance atherosclerotic changes involving the aorta and branch vessels, no aneurysm or dissection, probable possible acute compression fracture of L4.  ASSESSMENT/PLAN:  Ms. Weight is an 77 year old female with 1. Nausea and vomiting which I suspect is related to narcotics which     she recently started 2. Severe hyponatremia.  This could be secondary to volume depletion     from poor p.o. intake or SIADH from severe pain and shingles. 3. Shingles/herpes zoster outbreak on her face. 4. Possible L4 compression fracture. 5. History of underlying irritable bowel syndrome. 6. History of coronary artery disease, status post coronary artery     bypass graft and percutaneous coronary intervention.  PLAN:  We will admit her to step down unit.  We will give her a fluid trial, if she clinically appears dry to me.  We will check a BMET in 5 hours and then q.8 h.  We will check urine sodium and osmolarity to evaluate for possible SIADH.  We will continue Valtrex for now and put her on low-dose Neurontin at bedtime since her pain has improved some. In terms of her nausea and vomiting, we will put on a clear liquid diet and advance tolerated and supportive care with Zofran and Phenergan p.r.n.  Further management as condition evolves in terms of the possibility of L4 compression fracture, we will repeat an x-ray in the morning.  Further management as condition evolves.     Zannie Cove, MD     PJ/MEDQ  D:  06/12/2011  T:  06/12/2011  Job:  [id number]  cc:   Evelena Peat, M.D.  Electronically Signed by Zannie Cove  on 07/25/2011 02:13:25 PM

## 2011-08-12 ENCOUNTER — Ambulatory Visit (INDEPENDENT_AMBULATORY_CARE_PROVIDER_SITE_OTHER): Payer: Medicare Other | Admitting: Cardiology

## 2011-08-12 ENCOUNTER — Other Ambulatory Visit: Payer: Medicare Other | Admitting: *Deleted

## 2011-08-12 ENCOUNTER — Encounter: Payer: Self-pay | Admitting: Cardiology

## 2011-08-12 VITALS — BP 100/60 | HR 80 | Ht 61.0 in | Wt 102.0 lb

## 2011-08-12 DIAGNOSIS — E7800 Pure hypercholesterolemia, unspecified: Secondary | ICD-10-CM

## 2011-08-12 DIAGNOSIS — I259 Chronic ischemic heart disease, unspecified: Secondary | ICD-10-CM

## 2011-08-12 DIAGNOSIS — B0221 Postherpetic geniculate ganglionitis: Secondary | ICD-10-CM

## 2011-08-12 DIAGNOSIS — Z951 Presence of aortocoronary bypass graft: Secondary | ICD-10-CM

## 2011-08-12 DIAGNOSIS — Z9889 Other specified postprocedural states: Secondary | ICD-10-CM

## 2011-08-12 DIAGNOSIS — B0229 Other postherpetic nervous system involvement: Secondary | ICD-10-CM

## 2011-08-12 NOTE — Assessment & Plan Note (Signed)
Patient has been doing well in regard to her coronary disease.  She has not had any recurrent angina pectoris.

## 2011-08-12 NOTE — Patient Instructions (Signed)
Your physician wants you to follow-up in: 4 months You will receive a reminder letter in the mail two months in advance. If you don't receive a letter, please call our office to schedule the follow-up appointment.     .Your physician recommends that you continue on your current medications as directed. Please refer to the Current Medication list given to you today.  

## 2011-08-12 NOTE — Assessment & Plan Note (Signed)
Patient has a past history of hypercholesterolemia.  She has lost considerable weight with recent acute illnesses.  At this point, we are not emphasizing low cholesterol.  We want her to eat and to gain weight

## 2011-08-12 NOTE — Progress Notes (Signed)
Sabrina Moore Date of Birth:  1926/12/20 [facility] / [facility] [address]   [address] [place], [place]  [address] [phone]           Fax   [phone]  History of Present Illness: This pleasant 77 year old woman is seen for a scheduled followup office visit.  She has a past history of known ischemic heart disease.  She had coronary artery bypass graft surgery in 1996.  She had an abnormal treadmill Cardiolite stress test in April 2011, which was followed by cardiac catheterization and subsequent PCI on 02/12/10 with successful stenting of the saphenous vein graft to the diagonal and also successful stenting of the saphenous vein graft to the intermediate.  Is not having any recurrent chest pain.  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  She has had severe uncontrollable pain.  She has seen neurology and also several internists.  The family could not handle the duration and had to put her temporarily in [facility] nursing home, where she remains.  She now has a new primary care provider, Dr. Johnella Moloney, who is looking after her at the nursing home, and she is making good progress under his care.  The severe facial pain is starting to abate.  Recently he tried some lidocaine spray.  She has also been on low-dose Tegretol.  Current Outpatient Prescriptions  Medication Sig Dispense Refill  . acetaminophen (TYLENOL) 160 MG tablet Take 160 mg by mouth every 6 (six) hours as needed.        . ALPRAZolam (XANAX) 0.25 MG tablet Take 1 tablet (0.25 mg total) by mouth 2 (two) times daily.  100 tablet  2  . amLODipine (NORVASC) 5 MG tablet Take 5 mg by mouth daily.        Marland Kitchen aspirin 81 MG tablet Take 81 mg by mouth daily.        . carbamazepine (TEGRETOL) 100 MG chewable tablet Chew by mouth 2 (two) times daily. 100 mg tid non chewable       . clopidogrel (PLAVIX) 75 MG tablet Take 1 tablet (75 mg total) by mouth  daily.  90 tablet  3  . Coenzyme Q10 (COQ10 PO) Take by mouth daily.        . diphenhydrAMINE (BENADRYL) 12.5 MG chewable tablet Chew 12.5 mg by mouth 4 (four) times daily as needed.        . docusate sodium (COLACE) 100 MG capsule Take 100 mg by mouth 2 (two) times daily.        . famotidine (PEPCID) 10 MG tablet Take 10 mg by mouth 2 (two) times daily.        . hyoscyamine (LEVSIN) 0.125 MG tablet Take 0.125 mg by mouth 3 (three) times daily.        . metoprolol succinate (TOPROL-XL) 25 MG 24 hr tablet Take 12.5 mg by mouth daily.        . mometasone (NASONEX) 50 MCG/ACT nasal spray 2 sprays by Nasal route as needed.        Marland Kitchen NITROSTAT 0.4 MG SL tablet PLACE 1 TABLET UNDER TONGUE IF NEEDED FOR CHEST PAIN  25 tablet  1  . Omega-3 Fatty Acids (FISH OIL PO) Take by mouth daily.        . ondansetron (ZOFRAN) 4 MG tablet Take 4 mg by mouth every 8 (eight) hours as needed.        Marland Kitchen  pantoprazole (PROTONIX) 40 MG tablet TAKE 1 TABLET AT BEDTIME  30 tablet  11  . Polyethylene Glycol 3350 POWD by Does not apply route. As needed       . pregabalin (LYRICA) 75 MG capsule Take 75 mg by mouth 2 (two) times daily.        . traMADol (ULTRAM) 50 MG tablet Take 50 mg by mouth every 6 (six) hours as needed. Maximum dose= 8 tablets per day        Current Facility-Administered Medications  Medication Dose Route Frequency Provider Last Rate Last Dose  . pneumococcal 23 valent vaccine (PNU-IMMUNE) injection 0.5 mL  0.5 mL Intramuscular Once Bruce W Burchette        Allergies  Allergen Reactions  . Amitriptyline   . Crestor (Rosuvastatin Calcium)   . Lescol     Gi symptoms  . Lipitor (Atorvastatin Calcium)   . Lovastatin   . Novocain   . Oxycodone Nausea And Vomiting  . Phenergan     No good  . Pravachol   . Shellfish Allergy   . Zocor (Simvastatin)     Patient Active Problem List  Diagnoses  . IHD (ischemic heart disease)  . Chest discomfort  . Abnormal stress ECG with treadmill  . Dyslipidemia   . Vasovagal syncope  . IDA (iron deficiency anemia)  . IBS (irritable bowel syndrome)  . Fatigue  . Anxiety  . Hypertension  . Hypercholesterolemia  . Hx of CABG  . Fibromyalgia  . Normocytic anemia  . Postherpetic neuralgia    History  Smoking status  . Never Smoker   Smokeless tobacco  . Not on file    History  Alcohol Use No    Family History  Problem Relation Age of Onset  . Breast cancer Mother   . Ovarian cancer Mother   . Cancer Mother 37    ovarian  . Heart disease Father   . Arthritis Father     Review of Systems: Constitutional: no fever chills diaphoresis or fatigue or change in weight.  Head and neck: no hearing loss, no epistaxis, no photophobia or visual disturbance. Respiratory: No cough, shortness of breath or wheezing. Cardiovascular: No chest pain peripheral edema, palpitations. Gastrointestinal: No abdominal distention, no abdominal pain, no change in bowel habits hematochezia or melena. Genitourinary: No dysuria, no frequency, no urgency, no nocturia. Musculoskeletal:No arthralgias, no back pain, no gait disturbance or myalgias. Neurological: No dizziness, no headaches, no numbness, no seizures, no syncope, no weakness, no tremors. Hematologic: No lymphadenopathy, no easy bruising. Psychiatric: No confusion, no hallucinations, no sleep disturbance.    Physical Exam: Filed Vitals:   08/12/11 1419  BP: 100/60  Pulse: 80   general appearance reveals a thin, elderly, Caucasian female, in no acute distress at the present time.  She has some erythema of the right side of her face secondary to mild skin reaction to the lidocaine spray.  Being applied for relief of pain for postherpetic neuralgia.The chest is clear to percussion and auscultation. There are no rales or rhonchi. Expansion of the chest is symmetrical.  The precordium is quiet.  The first heart sound is normal.  The second heart sound is physiologically split.  There is no murmur  gallop rub or click.  There is no abnormal lift or heave.  The abdomen is soft and nontender. Bowel sounds are normal. The liver and spleen are not enlarged. There Are no abdominal masses. There are no bruits.  The pedal pulses are good.  There is no phlebitis or edema.  There is no cyanosis or clubbing. Strength is normal and symmetrical in all extremities.  There is no lateralizing weakness.  There are no sensory deficits.     Assessment / Plan: Continue under the good medical care of Dr. Kevan Ny.  We will see her for cardiology followup in 4 months.

## 2011-08-12 NOTE — Consult Note (Signed)
NAMETA, FAIR NO.:  [id number]  MEDICAL RECORD NO.:  [id number]  LOCATION:  1432                         FACILITY:  [facility]  PHYSICIAN:  Melvyn Novas, M.D.  DATE OF BIRTH:  1927-10-08  DATE OF CONSULTATION: DATE OF DISCHARGE:                                CONSULTATION   This is an 77 year old Caucasian right-handed female, patient of Dr. Joycelyn Schmid, at [facility] who presented with postherpetic trigeminal neuralgia and polypharmacy.  The patient did not respond to Baclofen, Benadryl, narcotics such as Dilaudid, antihistamines, anticholinergic such as Elavil, and Xanax with any reduction in her pain, and had on Friday told her family that she was no longer able to function because of the pain.  She became very tearful and at times yelling out what was interpreted as a sign of her pain.  I was the on- call physician for [facility] and changed her from 100 mg of Elavil to 50 mg believing that the anticholinergic component may contribute to her confusion.  I also asked to reduce the Xanax and to not give her the Benadryl that night.  I added 100 mg of carbamazepine for nightly dose, and if tolerated, wanted to go onto a b.i.d. dosing schedule.  The patient reportedly became more confused and paranoid after she took Carbatrol, and on Monday morning, her family called Dr. Marjory Lies in the office and then had her admitted to [facility].  The Neurology consult was called today upon request of the family for further mental status changes, but these have reportedly improved gradually over the day today.  She also had clear moments yesterday, but according to her nurse, became paranoid around 10:30 p.m. last night and the nursing staff suspected that there was a sundowning component to her behavior.  PAST MEDICAL HISTORY: 1. Herpes zoster facialis. 2. Failure to thrive. 3. Coronary artery disease, status post  bypass surgery. 4. An admission in August for dehydration and hyponatremia.  CURRENT MEDICATION LIST:  At home included Benadryl, Elavil, Xanax, Dilaudid, Lyrica, and carbamazepine.  LABS:  Here during admission; carbamazepine level 2.7, far below an intoxicating level.  TSH was 0.431.  Urinalysis was negative for UTI. White blood cell count was 6200, negative for infection, platelet count was normal.  The patient is mildly anemic with an H and H of 10/31. Sodium was 133 and decreased, potassium was 4, creatinine 0.49, BUN 13, glucose 118.  CT study of the head without contrast was obtained and showed generalized atrophy.  PHYSICAL EXAMINATION:  VITAL SIGNS:  97.8 degrees Fahrenheit; pulse rate between 88 and 92, so elevated; blood pressure of 145/81; respiratory rate of 18 at rest; 95% oxygen saturation on room air. MENTAL STATUS:  Today, the patient is alert and oriented.  She gives me time and date correctly, the place; she even knows that she is on the fourth floor of [facility].  She also gives me her date of birth, the name of the president, and her phone number.  In her own words, she describes her mental status as follows "I felt nothing suddenly, but gradually over the weekend, I became more confused.  I started to name things wrong way and had hallucinations.  My daughter told me that I saw things that she could not see."  She is unaware of the paranoid outbursts last night.  At this time, she appears very clear. HEENT:  She now complains of dry mouth, dry eyes, and pain with swallowing.  Pupils react equally to light and accommodation.  She has facial symmetry.  She does have very brittle lips and reddish swollen appearing tongue.  Full extraocular movements are noted without tremor or nystagmus.  Tongue and uvula are midline.  I see no active exudate or blisters. EXTREMITIES:  All extremities have 4+/5 antigravity strength and resistance.  Deep tendon reflexes  are symmetric.  Sensory is intact to fine touch.  There is a mild tremor on finger-nose testing, but no dysmetria or ataxia.  ASSESSMENT:  Delirium, in resolution; improving mental status with hydration, nutrition, and discontinuation of carbamazepine.  Also, I doubt that given her blood level, her white blood cell count, and her sodium level, the carbamazepine should have been the source of the increased confusion.  I do, however, acknowledge that within the frame of the polypharmacy, cross reactions may have been unforeseeable.  There was certainly not a withdrawal reaction to any of the medications that were otherwise changed since we did not discontinue those, just reduced and this is mainly Xanax and reduction in Elavil from 100 mg to 50 mg at night.  I agree with the discontinuation of the Tegretol. Discontinuation of Dilaudid should be attempted.  I would probably recommend reduction of all anticholinergic medication or keep the amitriptyline at the level of 50 mg nightly at the most.  I also discussed with her nurse that Lidoderm patch may be a useful addition; however, the Lidoderm patch was difficult to apply to the facial structures.  It may be better to use EMLA cream or to use Voltaren cream.  I also asked the patient to have her daughter buy some vitamin E oil for the chapped lips and reddish dry tongue.  Alternatively in the hospital, we can use Oasis mouthwash to decrease the dryness.  I have ordered I and O counts and calorie count for this patient with dysphagia.  Due to pain with swallowing, she has limited her caloric intake.  I suggested that cold beverages and ice cream may be helping her calorie intake and help to alleviate the burning sensation in her throat.  The patient will be followed by the General Neurology Service.     Melvyn Novas, M.D.     CD/MEDQ  D:  07/16/2011  T:  07/17/2011  Job:  [id number]  cc:   Joycelyn Schmid, MD Fax:  [phone]  Electronically Signed by Melvyn Novas M.D. on 08/12/2011 01:03:38 PM

## 2011-08-12 NOTE — Assessment & Plan Note (Signed)
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  She is gradually improving in terms of the intensity of the pain.

## 2011-08-13 ENCOUNTER — Telehealth: Payer: Self-pay | Admitting: Cardiology

## 2011-08-13 NOTE — Telephone Encounter (Signed)
Pt's dtr wants to know if we are able to find out if pt had pneumonia shot while in the hospital

## 2011-08-13 NOTE — Telephone Encounter (Signed)
Left message needs to contact

## 2011-10-04 ENCOUNTER — Telehealth: Payer: Self-pay | Admitting: Cardiology

## 2011-10-04 NOTE — Telephone Encounter (Signed)
LOV,12 faxed to [facility] @ [phone] 10/04/11/KM

## 2011-12-10 ENCOUNTER — Ambulatory Visit (INDEPENDENT_AMBULATORY_CARE_PROVIDER_SITE_OTHER): Payer: Medicare Other | Admitting: Cardiology

## 2011-12-10 DIAGNOSIS — R5381 Other malaise: Secondary | ICD-10-CM

## 2011-12-10 DIAGNOSIS — Z951 Presence of aortocoronary bypass graft: Secondary | ICD-10-CM

## 2011-12-10 DIAGNOSIS — R5383 Other fatigue: Secondary | ICD-10-CM

## 2011-12-10 DIAGNOSIS — I119 Hypertensive heart disease without heart failure: Secondary | ICD-10-CM

## 2011-12-10 DIAGNOSIS — R55 Syncope and collapse: Secondary | ICD-10-CM

## 2011-12-10 DIAGNOSIS — E785 Hyperlipidemia, unspecified: Secondary | ICD-10-CM

## 2011-12-10 DIAGNOSIS — E7800 Pure hypercholesterolemia, unspecified: Secondary | ICD-10-CM

## 2011-12-10 NOTE — Patient Instructions (Addendum)
Your physician recommends that you continue on your current medications as directed. Please refer to the Current Medication list given to you today. Your physician recommends that you schedule a follow-up appointment in: 4 months with fasting labs (lp/bmet/hfp), will have you come for labs ahead of appointment

## 2011-12-11 NOTE — Progress Notes (Signed)
Sabrina Moore Date of Birth:  August 25, 1927 [facility] [address] [place], [place]  [address] [phone]         Fax   [phone]  History of Present Illness: This pleasant 78 year old woman is seen for a scheduled four-month followup office visit.  She has a history of ischemic heart disease and is status post CABG.  Coronary artery bypass graft surgery was in 1996.  She had an abnormal Cardiolite stress test in April 2011 and had a subsequent PCI on 02/12/10 with successful stenting of the saphenous vein graft to the diagonal and also successful stenting of the saphenous vein graft to the intermediate.  She has not been experiencing any recent chest pain or angina.  She has had a problem with severe postherpetic neuralgia.  Her pain has improved on Tegretol and Lyrica.  Current Outpatient Prescriptions  Medication Sig Dispense Refill  . ALPRAZolam (XANAX) 0.25 MG tablet Take 0.25 mg by mouth at bedtime as needed.      Marland Kitchen aspirin 81 MG tablet Take 81 mg by mouth daily.        . carbamazepine (TEGRETOL) 100 MG chewable tablet Chew by mouth 2 (two) times daily. 100 mg tid non chewable       . carbamazepine (TEGRETOL) 200 MG tablet Take 200 mg by mouth 2 (two) times daily.      . citalopram (CELEXA) 10 MG tablet Take 10 mg by mouth daily.      . clopidogrel (PLAVIX) 75 MG tablet Take 1 tablet (75 mg total) by mouth daily.  90 tablet  3  . Coenzyme Q10 (COQ10 PO) Take by mouth daily.        Marland Kitchen docusate sodium (COLACE) 100 MG capsule Take 100 mg by mouth daily.       . famotidine (PEPCID) 10 MG tablet Take 10 mg by mouth 2 (two) times daily.        . ferrous sulfate 325 (65 FE) MG tablet Take 325 mg by mouth daily.      . metoprolol succinate (TOPROL-XL) 25 MG 24 hr tablet Take 12.5 mg by mouth daily.        Marland Kitchen NITROSTAT 0.4 MG SL tablet PLACE 1 TABLET UNDER TONGUE IF NEEDED FOR CHEST PAIN  25 tablet  1  . Omega-3 Fatty Acids (FISH OIL PO) Take by mouth daily.        .  pantoprazole (PROTONIX) 40 MG tablet TAKE 1 TABLET AT BEDTIME  30 tablet  11  . pregabalin (LYRICA) 100 MG capsule Take 100 mg by mouth 2 (two) times daily.      Marland Kitchen senna (SENOKOT) 8.6 MG tablet Take 1 tablet by mouth daily as needed.      . sodium chloride 1 G tablet Take 1 g by mouth. Takes every Monday and friday       Current Facility-Administered Medications  Medication Dose Route Frequency Provider Last Rate Last Dose  . pneumococcal 23 valent vaccine (PNU-IMMUNE) injection 0.5 mL  0.5 mL Intramuscular Once Kristian Covey, MD        Allergies  Allergen Reactions  . Amitriptyline   . Crestor (Rosuvastatin Calcium)   . Lescol     Gi symptoms  . Lipitor (Atorvastatin Calcium)   . Lovastatin   . Novocain   . Oxycodone Nausea And Vomiting  . Phenergan     No good  . Pravachol   . Shellfish Allergy   . Zocor (Simvastatin)  Patient Active Problem List  Diagnoses  . IHD (ischemic heart disease)  . Chest discomfort  . Abnormal stress ECG with treadmill  . Dyslipidemia  . Vasovagal syncope  . IDA (iron deficiency anemia)  . IBS (irritable bowel syndrome)  . Fatigue  . Anxiety  . Hypertension  . Hypercholesterolemia  . Hx of CABG  . Fibromyalgia  . Normocytic anemia  . Postherpetic neuralgia    History  Smoking status  . Never Smoker   Smokeless tobacco  . Not on file    History  Alcohol Use No    Family History  Problem Relation Age of Onset  . Breast cancer Mother   . Ovarian cancer Mother   . Cancer Mother 32    ovarian  . Heart disease Father   . Arthritis Father     Review of Systems: Constitutional: no fever chills diaphoresis or fatigue or change in weight.  Head and neck: no hearing loss, no epistaxis, no photophobia or visual disturbance. Respiratory: No cough, shortness of breath or wheezing. Cardiovascular: No chest pain peripheral edema, palpitations. Gastrointestinal: No abdominal distention, no abdominal pain, no change in bowel  habits hematochezia or melena. Genitourinary: No dysuria, no frequency, no urgency, no nocturia. Musculoskeletal:No arthralgias, no back pain, no gait disturbance or myalgias. Neurological: No dizziness, no headaches, no numbness, no seizures, no syncope, no weakness, no tremors. Hematologic: No lymphadenopathy, no easy bruising. Psychiatric: No confusion, no hallucinations, no sleep disturbance.    Physical Exam: Filed Vitals:   12/10/11 1630  BP: 130/70  Pulse: 60   General appearance reveals a petite elderly woman in no acute distress.Pupils equal and reactive.   Extraocular Movements are full.  There is no scleral icterus.  The mouth and pharynx are normal.  The neck is supple.  The carotids reveal no bruits.  The jugular venous pressure is normal.  The thyroid is not enlarged.  There is no lymphadenopathy.  The chest is clear to percussion and auscultation. There are no rales or rhonchi. Expansion of the chest is symmetrical.  The precordium is quiet.  The first heart sound is normal.  The second heart sound is physiologically split.  There is no murmur gallop rub or click.  There is no abnormal lift or heave.  The abdomen is soft and nontender. Bowel sounds are normal. The liver and spleen are not enlarged. There Are no abdominal masses. There are no bruits.  The pedal pulses are good.  There is no phlebitis or edema.  There is no cyanosis or clubbing. Strength is normal and symmetrical in all extremities.  There is no lateralizing weakness.  There are no sensory deficits.    Assessment / Plan: Continue same medication and be rechecked in 4 months for followup office visit and fasting lab work

## 2011-12-11 NOTE — Assessment & Plan Note (Signed)
The patient has not been having any recurrent chest pain.  She is presently a resident in an assisted living facility at [facility].  She has limited mobility at this time.

## 2011-12-11 NOTE — Assessment & Plan Note (Signed)
Her blood pressure has been more stable and she has not been experiencing any dizziness or syncope.

## 2011-12-11 NOTE — Assessment & Plan Note (Signed)
The patient continues to have moderate degree of malaise and fatigue.

## 2011-12-11 NOTE — Assessment & Plan Note (Signed)
The patient notes that her current diet at the nursing home is not heart healthy and is different from what she would be in if she were home

## 2011-12-16 NOTE — Progress Notes (Signed)
Addended by: Judithe Modest D on: 12/16/2011 12:24 PM   Modules accepted: Orders

## 2012-01-10 IMAGING — CT CT HEAD W/O CM
2 series · 16 of 30 positions shown, 20 images · non-contrast
Comparison: 06/12/2011

CLINICAL DATA: Altered mental status

CT HEAD WITHOUT CONTRAST
TECHNIQUE: Contiguous axial images were obtained from the base of
the skull through the vertex without contrast.

[Series 2: head w/o · axial · non-contrast · 0.39mm/px · z∈[-122,-2]mm · 13 of 28 slices shown, 17 images]
[im 2/28  brain]
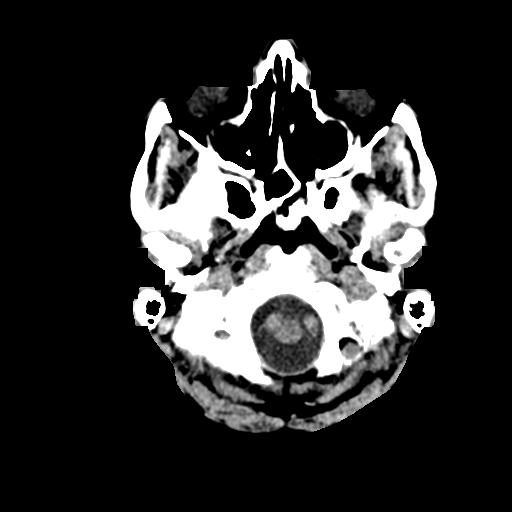
[im 2/28  bone]
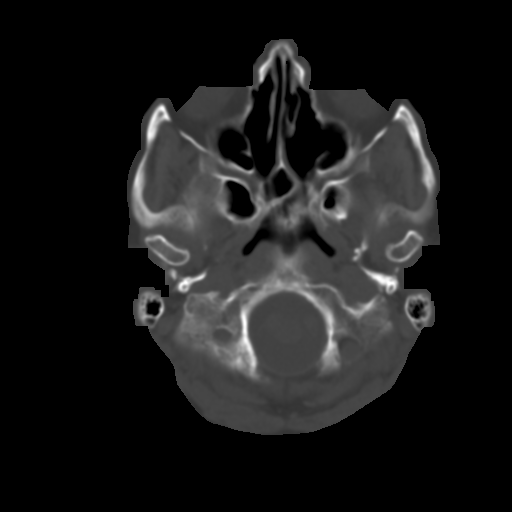
[im 4/28  brain]
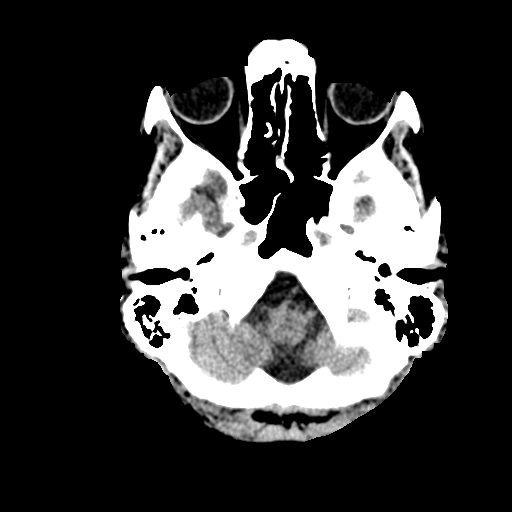
[im 6/28  brain]
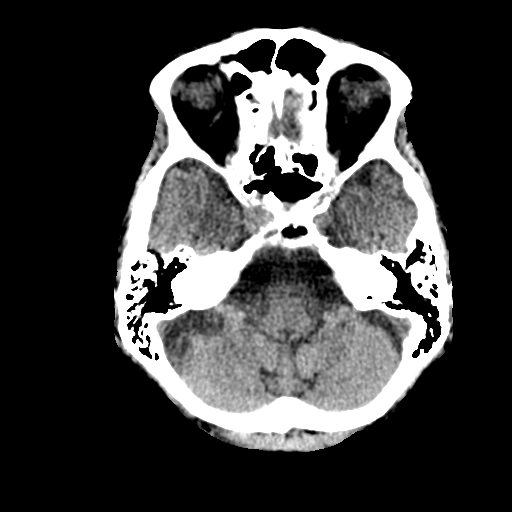
[im 8/28  brain]
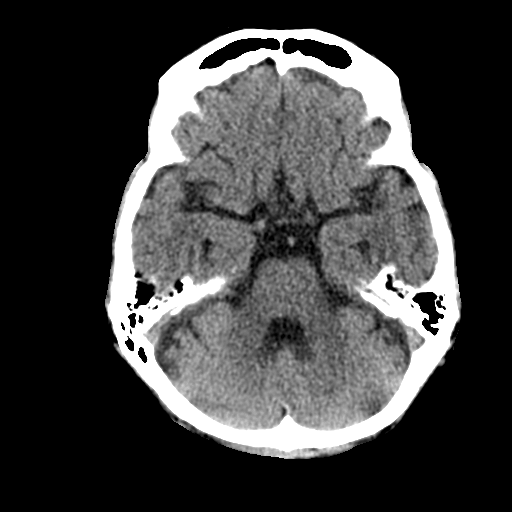
[im 10/28  brain]
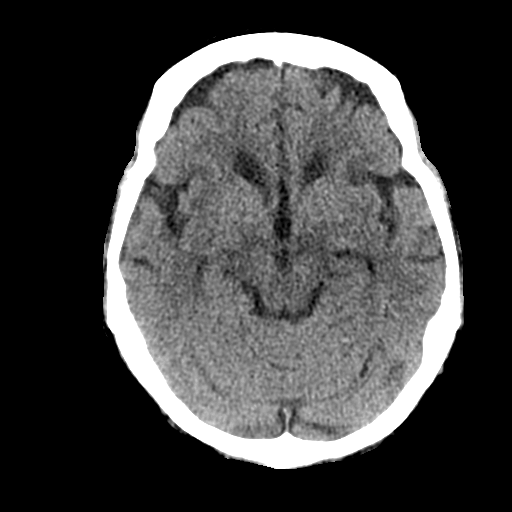
[im 10/28  bone]
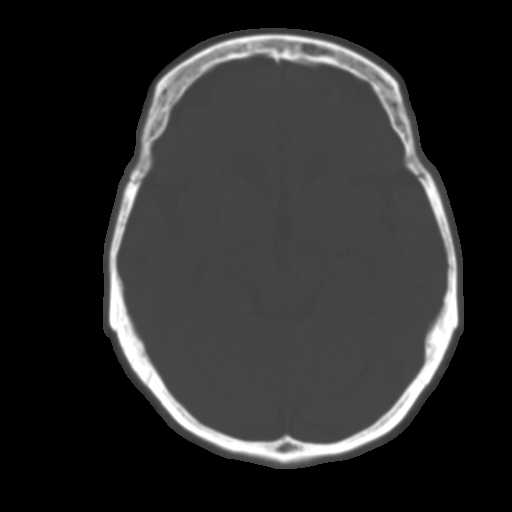
[im 12/28  brain]
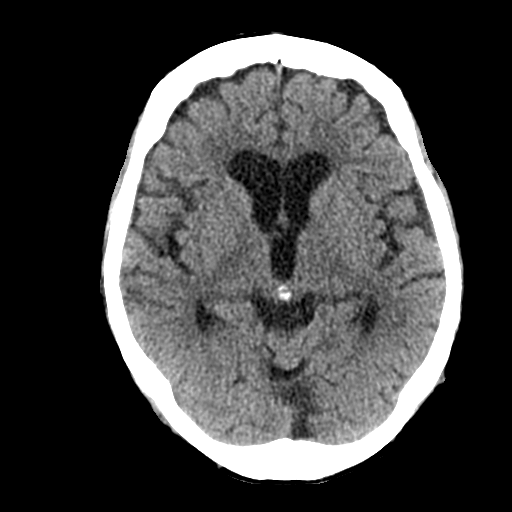
[im 14/28  brain]
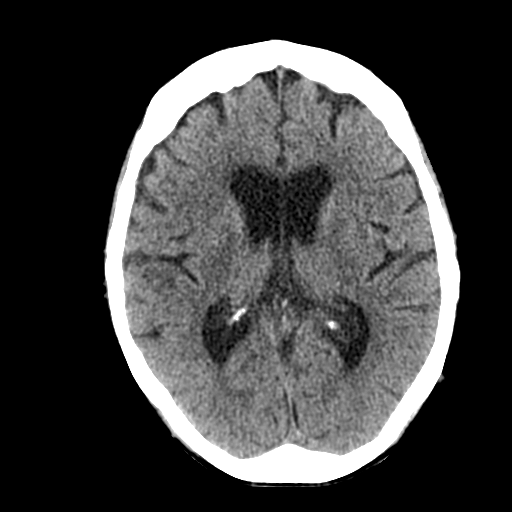
[im 16/28  brain]
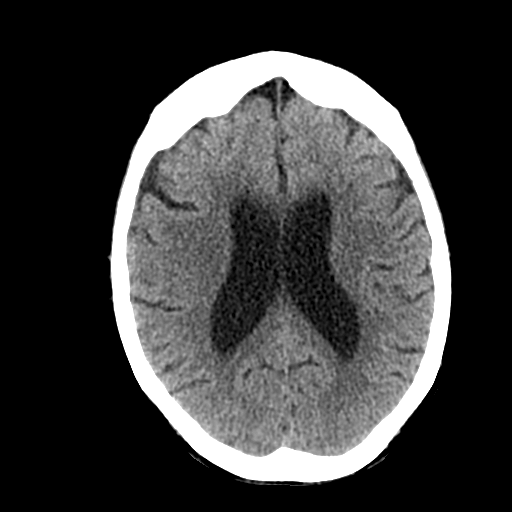
[im 18/28  brain]
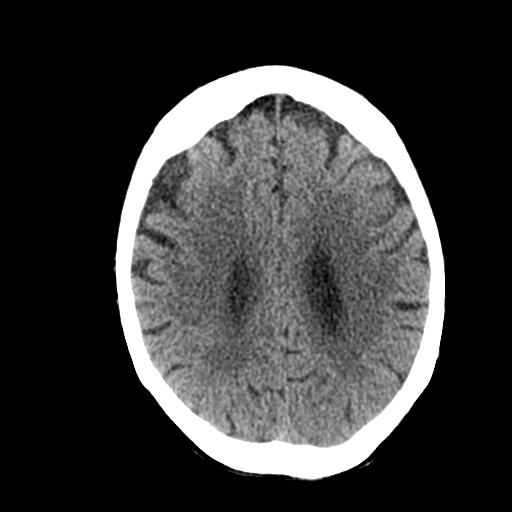
[im 18/28  bone]
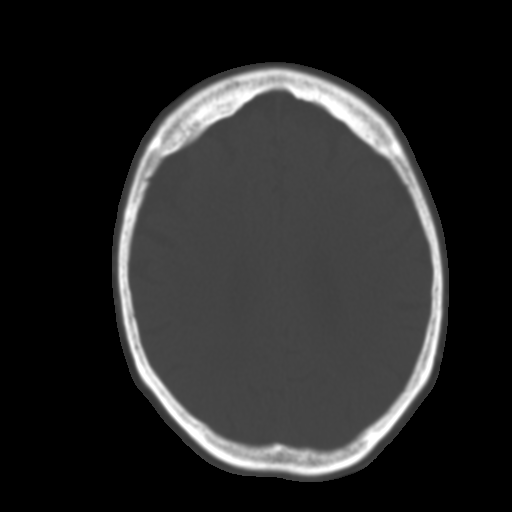
[im 20/28  brain]
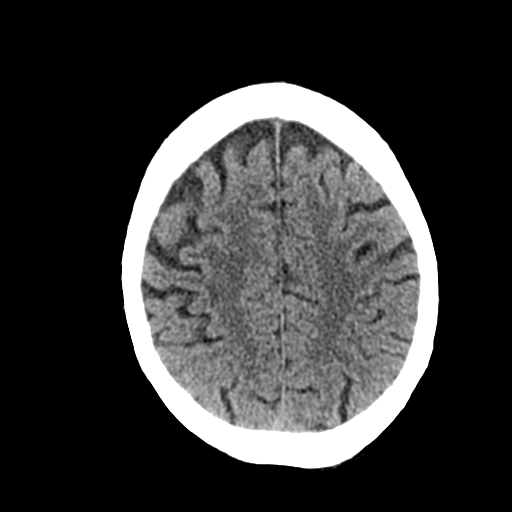
[im 22/28  brain]
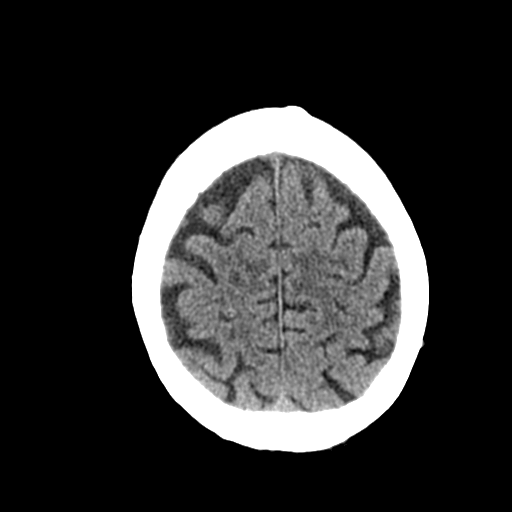
[im 24/28  brain]
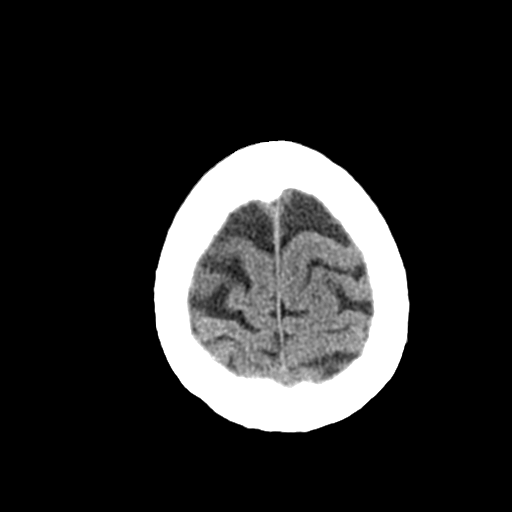
[im 26/28  brain]
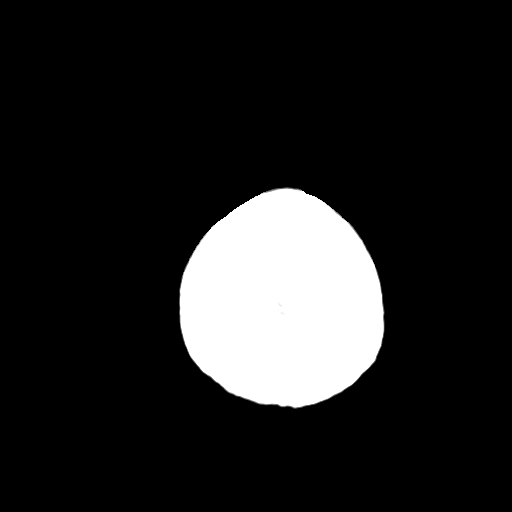
[im 26/28  bone]
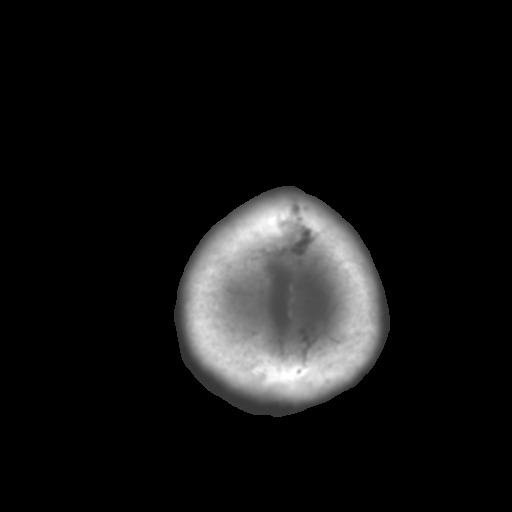

[Series 3: bone windows · axial · 0.39mm/px · z∈[-122,-82]mm · 3 of 28 slices shown]
[im 2/28  bone]
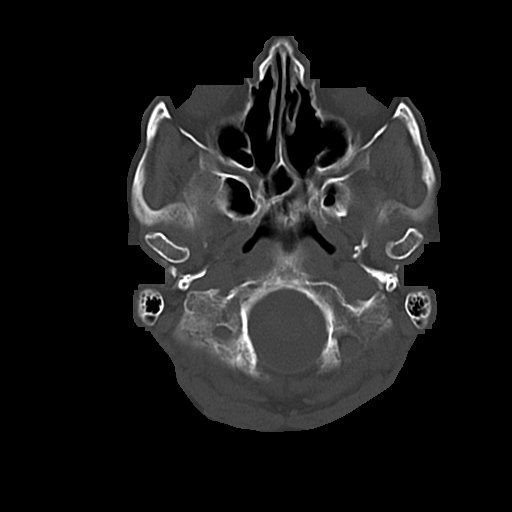
[im 6/28  bone]
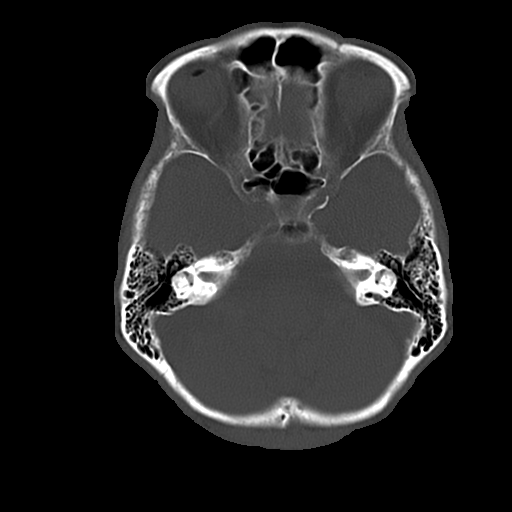
[im 10/28  bone]
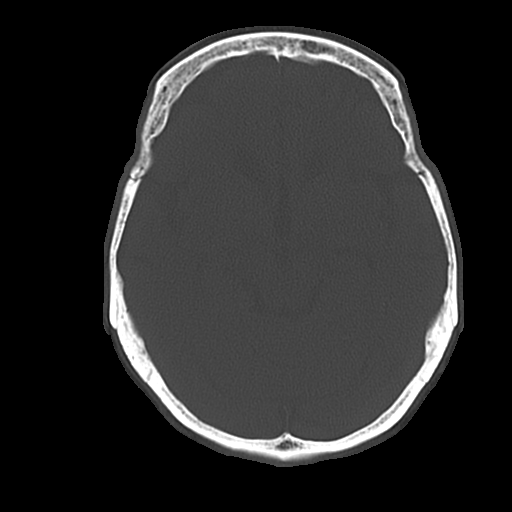

[16 of 30 positions shown; findings below may reference images not displayed]

FINDINGS: The brain again this shows generalized atrophy without
evidence of acute infarction, mass lesion, hemorrhage,
hydrocephalus or extra-axial collection.  No calvarial abnormality.
Sinuses, middle ears and mastoids are clear.
IMPRESSION: Chronic atrophic changes.  No acute or reversible findings.

## 2012-02-17 ENCOUNTER — Telehealth: Payer: Self-pay | Admitting: Cardiology

## 2012-02-17 NOTE — Telephone Encounter (Signed)
Daughter states patient is getting harder to get out with her declining health.  Per daughter she has been having issues with low sodium levels and had labs at PCP end of April.  Will forward to  [name] for review to decide on labs to be done by home health.

## 2012-02-17 NOTE — Telephone Encounter (Signed)
Pt's dtr calling for order for labs to be called to  [phone] [facility] so they can come out and do

## 2012-02-17 NOTE — Telephone Encounter (Signed)
Patient Daughter Jearld Lesch returning nurses call, she can be reached at [phone]

## 2012-02-17 NOTE — Telephone Encounter (Signed)
Left message to call back  

## 2012-02-25 ENCOUNTER — Telehealth (HOSPITAL_COMMUNITY): Payer: Self-pay

## 2012-02-25 NOTE — Telephone Encounter (Signed)
Spoke with Pts daughter.  I advised her that her mother would need a current MRI ordered by her PCP and at that point ref. To Dr. Corliss Skains.

## 2012-02-26 ENCOUNTER — Inpatient Hospital Stay (HOSPITAL_COMMUNITY)
Admission: AD | Admit: 2012-02-26 | Discharge: 2012-03-02 | DRG: 644 | Disposition: A | Discharge status: Skilled Nursing Facility | Payer: Medicare Other | Source: Ambulatory Visit | Attending: Internal Medicine | Admitting: Internal Medicine

## 2012-02-26 ENCOUNTER — Encounter (HOSPITAL_COMMUNITY): Payer: Self-pay | Admitting: *Deleted

## 2012-02-26 DIAGNOSIS — B0222 Postherpetic trigeminal neuralgia: Secondary | ICD-10-CM

## 2012-02-26 DIAGNOSIS — E7800 Pure hypercholesterolemia, unspecified: Secondary | ICD-10-CM

## 2012-02-26 DIAGNOSIS — R55 Syncope and collapse: Secondary | ICD-10-CM

## 2012-02-26 DIAGNOSIS — Z7902 Long term (current) use of antithrombotics/antiplatelets: Secondary | ICD-10-CM

## 2012-02-26 DIAGNOSIS — F32A Depression, unspecified: Secondary | ICD-10-CM | POA: Diagnosis present

## 2012-02-26 DIAGNOSIS — R627 Adult failure to thrive: Secondary | ICD-10-CM | POA: Diagnosis present

## 2012-02-26 DIAGNOSIS — Z951 Presence of aortocoronary bypass graft: Secondary | ICD-10-CM

## 2012-02-26 DIAGNOSIS — D649 Anemia, unspecified: Secondary | ICD-10-CM | POA: Diagnosis present

## 2012-02-26 DIAGNOSIS — R630 Anorexia: Secondary | ICD-10-CM | POA: Diagnosis present

## 2012-02-26 DIAGNOSIS — K589 Irritable bowel syndrome without diarrhea: Secondary | ICD-10-CM

## 2012-02-26 DIAGNOSIS — IMO0001 Reserved for inherently not codable concepts without codable children: Secondary | ICD-10-CM | POA: Diagnosis present

## 2012-02-26 DIAGNOSIS — I2589 Other forms of chronic ischemic heart disease: Secondary | ICD-10-CM | POA: Diagnosis present

## 2012-02-26 DIAGNOSIS — E785 Hyperlipidemia, unspecified: Secondary | ICD-10-CM | POA: Diagnosis present

## 2012-02-26 DIAGNOSIS — Z7982 Long term (current) use of aspirin: Secondary | ICD-10-CM

## 2012-02-26 DIAGNOSIS — I10 Essential (primary) hypertension: Secondary | ICD-10-CM | POA: Diagnosis present

## 2012-02-26 DIAGNOSIS — R4182 Altered mental status, unspecified: Secondary | ICD-10-CM

## 2012-02-26 DIAGNOSIS — F3289 Other specified depressive episodes: Secondary | ICD-10-CM | POA: Diagnosis present

## 2012-02-26 DIAGNOSIS — R9439 Abnormal result of other cardiovascular function study: Secondary | ICD-10-CM

## 2012-02-26 DIAGNOSIS — R110 Nausea: Secondary | ICD-10-CM | POA: Diagnosis present

## 2012-02-26 DIAGNOSIS — F419 Anxiety disorder, unspecified: Secondary | ICD-10-CM | POA: Diagnosis present

## 2012-02-26 DIAGNOSIS — Z299 Encounter for prophylactic measures, unspecified: Secondary | ICD-10-CM

## 2012-02-26 DIAGNOSIS — F411 Generalized anxiety disorder: Secondary | ICD-10-CM | POA: Diagnosis present

## 2012-02-26 DIAGNOSIS — R350 Frequency of micturition: Secondary | ICD-10-CM | POA: Diagnosis present

## 2012-02-26 DIAGNOSIS — M79605 Pain in left leg: Secondary | ICD-10-CM | POA: Diagnosis present

## 2012-02-26 DIAGNOSIS — S32000A Wedge compression fracture of unspecified lumbar vertebra, initial encounter for closed fracture: Secondary | ICD-10-CM | POA: Diagnosis present

## 2012-02-26 DIAGNOSIS — N3941 Urge incontinence: Secondary | ICD-10-CM | POA: Diagnosis present

## 2012-02-26 DIAGNOSIS — M797 Fibromyalgia: Secondary | ICD-10-CM

## 2012-02-26 DIAGNOSIS — B0229 Other postherpetic nervous system involvement: Secondary | ICD-10-CM | POA: Diagnosis present

## 2012-02-26 DIAGNOSIS — R0789 Other chest pain: Secondary | ICD-10-CM

## 2012-02-26 DIAGNOSIS — G8929 Other chronic pain: Secondary | ICD-10-CM | POA: Diagnosis present

## 2012-02-26 DIAGNOSIS — H5316 Psychophysical visual disturbances: Secondary | ICD-10-CM | POA: Diagnosis present

## 2012-02-26 DIAGNOSIS — E871 Hypo-osmolality and hyponatremia: Secondary | ICD-10-CM

## 2012-02-26 DIAGNOSIS — Z888 Allergy status to other drugs, medicaments and biological substances status: Secondary | ICD-10-CM

## 2012-02-26 DIAGNOSIS — I259 Chronic ischemic heart disease, unspecified: Secondary | ICD-10-CM | POA: Diagnosis present

## 2012-02-26 DIAGNOSIS — M545 Low back pain, unspecified: Secondary | ICD-10-CM | POA: Diagnosis present

## 2012-02-26 DIAGNOSIS — R5383 Other fatigue: Secondary | ICD-10-CM

## 2012-02-26 DIAGNOSIS — M8448XA Pathological fracture, other site, initial encounter for fracture: Secondary | ICD-10-CM | POA: Diagnosis present

## 2012-02-26 DIAGNOSIS — B028 Zoster with other complications: Secondary | ICD-10-CM

## 2012-02-26 DIAGNOSIS — E236 Other disorders of pituitary gland: Principal | ICD-10-CM | POA: Diagnosis present

## 2012-02-26 DIAGNOSIS — R1310 Dysphagia, unspecified: Secondary | ICD-10-CM | POA: Diagnosis present

## 2012-02-26 DIAGNOSIS — F329 Major depressive disorder, single episode, unspecified: Secondary | ICD-10-CM | POA: Diagnosis present

## 2012-02-26 DIAGNOSIS — D509 Iron deficiency anemia, unspecified: Secondary | ICD-10-CM | POA: Diagnosis present

## 2012-02-26 DIAGNOSIS — Z79899 Other long term (current) drug therapy: Secondary | ICD-10-CM

## 2012-02-26 DIAGNOSIS — F1110 Opioid abuse, uncomplicated: Secondary | ICD-10-CM

## 2012-02-26 DIAGNOSIS — I2510 Atherosclerotic heart disease of native coronary artery without angina pectoris: Secondary | ICD-10-CM | POA: Diagnosis present

## 2012-02-26 DIAGNOSIS — Z9181 History of falling: Secondary | ICD-10-CM

## 2012-02-26 HISTORY — DX: Zoster without complications: B02.9

## 2012-02-26 LAB — CBC
Hemoglobin: 13.7 g/dL (ref 12.0–15.0)
MCH: 32.1 pg (ref 26.0–34.0)
RBC: 4.27 MIL/uL (ref 3.87–5.11)

## 2012-02-26 LAB — COMPREHENSIVE METABOLIC PANEL
AST: 19 U/L (ref 0–37)
CO2: 31 mEq/L (ref 19–32)
Calcium: 8.6 mg/dL (ref 8.4–10.5)
Creatinine, Ser: 0.47 mg/dL — ABNORMAL LOW (ref 0.50–1.10)
GFR calc non Af Amer: 89 mL/min — ABNORMAL LOW (ref >90)

## 2012-02-26 LAB — PROTIME-INR
INR: 0.98 (ref 0.00–1.49)
Prothrombin Time: 13.2 seconds (ref 11.6–15.2)

## 2012-02-26 MED ORDER — PANTOPRAZOLE SODIUM 40 MG PO TBEC
40.0000 mg | DELAYED_RELEASE_TABLET | Freq: Every day | ORAL | Status: DC
  Administered 2012-02-26 – 2012-03-01 (×5): 40 mg via ORAL
  Filled 2012-02-26 (×8): qty 1

## 2012-02-26 MED ORDER — FERROUS SULFATE 325 (65 FE) MG PO TABS
325.0000 mg | ORAL_TABLET | Freq: Every day | ORAL | Status: DC
  Administered 2012-02-27 – 2012-03-02 (×5): 325 mg via ORAL
  Filled 2012-02-26 (×6): qty 1

## 2012-02-26 MED ORDER — ENOXAPARIN SODIUM 40 MG/0.4ML ~~LOC~~ SOLN
40.0000 mg | SUBCUTANEOUS | Status: DC
  Administered 2012-02-26 – 2012-03-01 (×5): 40 mg via SUBCUTANEOUS
  Filled 2012-02-26 (×6): qty 0.4

## 2012-02-26 MED ORDER — ASPIRIN EC 81 MG PO TBEC
81.0000 mg | DELAYED_RELEASE_TABLET | Freq: Every day | ORAL | Status: DC
  Administered 2012-02-27 – 2012-03-02 (×5): 81 mg via ORAL
  Filled 2012-02-26 (×5): qty 1

## 2012-02-26 MED ORDER — METOPROLOL SUCCINATE 12.5 MG HALF TABLET
12.5000 mg | ORAL_TABLET | Freq: Every day | ORAL | Status: DC
  Administered 2012-02-27 – 2012-03-02 (×5): 12.5 mg via ORAL
  Filled 2012-02-26 (×5): qty 1

## 2012-02-26 MED ORDER — BISMUTH SUBSALICYLATE 262 MG PO CHEW
524.0000 mg | CHEWABLE_TABLET | ORAL | Status: DC | PRN
Start: 2012-02-26 — End: 2012-03-02
  Filled 2012-02-26: qty 2

## 2012-02-26 MED ORDER — CITALOPRAM HYDROBROMIDE 10 MG PO TABS
10.0000 mg | ORAL_TABLET | Freq: Every day | ORAL | Status: DC
  Administered 2012-02-26 – 2012-02-29 (×4): 10 mg via ORAL
  Filled 2012-02-26 (×4): qty 1

## 2012-02-26 MED ORDER — SODIUM CHLORIDE 1 G PO TABS
1.0000 g | ORAL_TABLET | Freq: Every day | ORAL | Status: DC
  Administered 2012-02-26 – 2012-02-27 (×2): 1 g via ORAL
  Filled 2012-02-26 (×2): qty 1

## 2012-02-26 MED ORDER — ASPIRIN 81 MG PO TABS: 81.0000 mg | ORAL_TABLET | Freq: Every day | ORAL | Status: DC

## 2012-02-26 MED ORDER — ALPRAZOLAM 0.25 MG PO TABS
0.1250 mg | ORAL_TABLET | Freq: Two times a day (BID) | ORAL | Status: DC
  Administered 2012-02-27 – 2012-03-02 (×9): 0.125 mg via ORAL
  Filled 2012-02-26 (×9): qty 1

## 2012-02-26 MED ORDER — DOCUSATE SODIUM 100 MG PO CAPS
100.0000 mg | ORAL_CAPSULE | Freq: Every day | ORAL | Status: DC
  Administered 2012-02-27 – 2012-03-02 (×5): 100 mg via ORAL
  Filled 2012-02-26 (×5): qty 1

## 2012-02-26 MED ORDER — CALCIUM CARBONATE 600 MG PO TABS
600.0000 mg | ORAL_TABLET | Freq: Every day | ORAL | Status: DC
  Filled 2012-02-26: qty 1

## 2012-02-26 MED ORDER — CLOPIDOGREL BISULFATE 75 MG PO TABS
75.0000 mg | ORAL_TABLET | Freq: Every day | ORAL | Status: DC
  Administered 2012-02-27 – 2012-03-02 (×5): 75 mg via ORAL
  Filled 2012-02-26 (×6): qty 1

## 2012-02-26 MED ORDER — PNEUMOCOCCAL VAC POLYVALENT 25 MCG/0.5ML IJ INJ
0.5000 mL | INJECTION | Freq: Once | INTRAMUSCULAR | Status: DC
  Filled 2012-02-26: qty 0.5

## 2012-02-26 MED ORDER — FAMOTIDINE 10 MG PO TABS
10.0000 mg | ORAL_TABLET | Freq: Two times a day (BID) | ORAL | Status: DC
  Administered 2012-02-26 – 2012-03-02 (×10): 10 mg via ORAL
  Filled 2012-02-26 (×12): qty 1

## 2012-02-26 MED ORDER — ENOXAPARIN SODIUM 30 MG/0.3ML ~~LOC~~ SOLN: 30.0000 mg | SUBCUTANEOUS | Status: DC

## 2012-02-26 MED ORDER — CARBAMAZEPINE 100 MG PO CHEW
100.0000 mg | CHEWABLE_TABLET | Freq: Three times a day (TID) | ORAL | Status: DC
  Administered 2012-02-26 – 2012-03-02 (×14): 100 mg via ORAL
  Filled 2012-02-26 (×16): qty 1

## 2012-02-26 MED ORDER — PREGABALIN 100 MG PO CAPS
100.0000 mg | ORAL_CAPSULE | Freq: Two times a day (BID) | ORAL | Status: DC
  Administered 2012-02-26 – 2012-03-02 (×10): 100 mg via ORAL
  Filled 2012-02-26 (×10): qty 1

## 2012-02-26 MED ORDER — ALPRAZOLAM 0.25 MG PO TABS
0.2500 mg | ORAL_TABLET | Freq: Every day | ORAL | Status: DC
  Administered 2012-02-26 – 2012-03-01 (×5): 0.25 mg via ORAL
  Filled 2012-02-26 (×5): qty 1

## 2012-02-26 MED ORDER — HYOSCYAMINE SULFATE 0.125 MG PO TABS
0.1250 mg | ORAL_TABLET | Freq: Three times a day (TID) | ORAL | Status: DC
  Administered 2012-02-26: 0.125 mg via ORAL
  Filled 2012-02-26 (×5): qty 1

## 2012-02-26 MED ORDER — ALPRAZOLAM 0.25 MG PO TABS: 0.1250 mg | ORAL_TABLET | ORAL | Status: DC

## 2012-02-26 MED ORDER — SODIUM CHLORIDE 0.9 % IV SOLN
INTRAVENOUS | Status: DC
  Administered 2012-02-26: 20:00:00 via INTRAVENOUS

## 2012-02-26 MED ORDER — RISAQUAD PO CAPS
1.0000 | ORAL_CAPSULE | Freq: Every day | ORAL | Status: DC
  Administered 2012-02-26 – 2012-03-02 (×6): 1 via ORAL
  Filled 2012-02-26 (×6): qty 1

## 2012-02-26 NOTE — H&P (Addendum)
PCP:   WALSH,CATHERINE, MD, MD  Cardiologist=Brackbill Neurologist=Penumalli  Chief Complaint:  1)Low sodium levels 2)Failure to thrive 3) recent UTI Rx  This pleasant 78 yr old female was a direct admit from [facility] office--has had some low sodium over the past few weeks .  It was allegedly 123 about 1.5 weeks ago and then came up to 127 last thursday and on Monday 13th was 125-i have no record of these labs, but these are reported to me by her Daughter who is present in the room at time of exam.  Patient was seeing things that weren't there and hearing things and was becoming a bit more unclear over the past weekend and she has had persisting confusion.  Patient was taken to Dr. Claris Che office it sounds like mutlipel, times over the past week and ultimately, because of some nausea and "choking " yesterday was in to see dr. Clent Ridges today once again Patient describes nausea, and bladder irritation which was found out about 1 week ago (Rx with an antibiotic 1 week ago and TOC was neg yesterday took Keflex 500 mg tid which finished the past Thursday )-was Rx for a possible PNA at the same time as the UTI recently.   She also has RLE leg pain and a h/o Compressioon #'-states that these pains have been ongoing since December of last year and she has been scheduled to see interventional radiologist for workup of the same and possible vertebroplasty unfortunately at the time that she developed compression fractures she also developed Ramsay Hunt syndrome and had significant issues with regards to that   Today had some abdominal pain and choking-daughter describes urge incontinence and had urinary studies which were done and came out fine-Supposed to see a Urologist June 21st ( Dr Mcdiarmid)  1.5 weeks ago seen by Dr. Clent Ridges has been declining and not improving-the decision was made to send patient over to the hospital to further work her up for primary her low sodium levels and optimize her as best  medically possible-patient does not take any diuretics at present or any nephrotoxic agents that may contribute to low sodium. Her daughter does state that she has not been eating as much as she usually does and patient states that she has significant pain on the right side of her tongue when she does eat. This causes her to have difficulty swallowing. 3 weeks ago her appetite was a lot better and her daughter does not state that she's lost specific weight over this time but appetite has decreased  State no voices right now-does describe pain in the LLE which is longstanding-does have h/o of an L4 compression # and an L3 compression fracture-was supposed to have a vertebroplasty.  Had a case of shingles on her face which progressed to [facility] hunt Syndrome and was in the NH and had bad reactions to the meds for Shingles.  NO CP/SOB.  Some nasuea this am-has been this way for the past week.  Some mild stomach ache, no diarrhoea or vomiting blood, no fever, no cough or cold at present-   Some weakness over the past couple -no facial twisting, no one side of the body weakness, some balance issues since the low sodium   Review of Systems:  The patient denies Headaches , no blurred viosion or double out of the ordinary-Lyrica and tegretol seem to affect this..  Facial issues with the Meds for herpes is somehwat better., no difficulty swallowing right now but does have occasional diffculty  and has a h/o aspiration-was placed on hyoscamine and norvasc and these seemd to help  Past Medical History: History of ischemic heart disease status post CABG 1996, abnormal Cardiolite April 2011 PCIx1 02/12/10-after Cath 01/2010 showing Occluded RCA/LCA Iron def anemia-low iron in 06/2010 admit H/o hyponatremia 05/2011 admit-from Vol depletion Acute L4 compression # Ramsey Hunt syndrome right-sided face-resistant-followed by Dr. Marjory Lies H/o delirium on meds for the Herpes on 07/2011 admit-recommended to d/c all  anti-cholinergics then Appendectomy 1967 Tonsillectomy 1933 Oophorectomy 1967 Eye surgery 2009 cataract Squamous cell carcinoma 2012 History of irritable bowel syndrome-followed by Dr. Madilyn Fireman Ischemic heart disease Nausea dn weight loss noted on 09/2000 admit-had Peritoneal washings and Ovarian Ca ruled out then Biklateral endoscopic sinus surgery 11/2004-[facility]  Active Ambulatory Problems    Diagnosis Date Noted  . IHD (ischemic heart disease)   . Chest discomfort   . Abnormal stress ECG with treadmill   . Dyslipidemia   . Vasovagal syncope   . IDA (iron deficiency anemia)   . IBS (irritable bowel syndrome)   . Fatigue   . Anxiety   . Hypertension   . Hypercholesterolemia   . Hx of CABG 02/11/2011  . Fibromyalgia 02/11/2011  . Normocytic anemia 02/12/2011  . Postherpetic neuralgia 06/25/2011   Resolved Ambulatory Problems    Diagnosis Date Noted  . No Resolved Ambulatory Problems   Past Medical History  Diagnosis Date  . Pneumonia   . Angina   . Shingles      Past surgical history: Past Surgical History  Procedure Date  . Coronary artery bypass graft 1996  . Cardiac catheterization 02/09/10  . Appendectomy 1967  . Tonsillectomy 1933  . Oophorectomy 1967  . Eye surgery 2009    catarac  . Squamous cell carcinoma excision 2012    Medications: Prior to Admission medications   Medication Sig Start Date End Date Taking? Authorizing Provider  ALPRAZolam (XANAX) 0.25 MG tablet Take 0.25 mg by mouth at bedtime as needed. 06/26/11   Kristian Covey, MD  aspirin 81 MG tablet Take 81 mg by mouth daily.      Historical Provider, MD  carbamazepine (TEGRETOL) 100 MG chewable tablet Chew by mouth 2 (two) times daily. 100 mg tid non chewable     Historical Provider, MD  carbamazepine (TEGRETOL) 200 MG tablet Take 200 mg by mouth 2 (two) times daily.    Historical Provider, MD  citalopram (CELEXA) 10 MG tablet Take 10 mg by mouth daily.    Historical Provider, MD    clopidogrel (PLAVIX) 75 MG tablet Take 1 tablet (75 mg total) by mouth daily. 03/06/11   Cassell Clement, MD  Coenzyme Q10 (COQ10 PO) Take by mouth daily.      Historical Provider, MD  docusate sodium (COLACE) 100 MG capsule Take 100 mg by mouth daily.     Historical Provider, MD  famotidine (PEPCID) 10 MG tablet Take 10 mg by mouth 2 (two) times daily.      Historical Provider, MD  ferrous sulfate 325 (65 FE) MG tablet Take 325 mg by mouth daily.    Historical Provider, MD  metoprolol succinate (TOPROL-XL) 25 MG 24 hr tablet Take 12.5 mg by mouth daily.      Historical Provider, MD  NITROSTAT 0.4 MG SL tablet PLACE 1 TABLET UNDER TONGUE IF NEEDED FOR CHEST PAIN 05/02/11   Cassell Clement, MD  Omega-3 Fatty Acids (FISH OIL PO) Take by mouth daily.      Historical Provider, MD  pantoprazole (  PROTONIX) 40 MG tablet TAKE 1 TABLET AT BEDTIME 05/16/11   Kristian Covey, MD  pregabalin (LYRICA) 100 MG capsule Take 100 mg by mouth 2 (two) times daily.    Historical Provider, MD  senna (SENOKOT) 8.6 MG tablet Take 1 tablet by mouth daily as needed.    Historical Provider, MD  sodium chloride 1 G tablet Take 1 g by mouth. Takes every Monday and friday    Historical Provider, MD    Allergies:   Allergies  Allergen Reactions  . Amitriptyline   . Crestor (Rosuvastatin Calcium)   . Lescol     Gi symptoms  . Lipitor (Atorvastatin Calcium)   . Lovastatin   . Oxycodone Nausea And Vomiting  . Pravachol   . Procaine Hcl   . Promethazine Hcl     No good  . Shellfish Allergy   . Zocor (Simvastatin)     Social History:  reports that she has never smoked. She does not have any smokeless tobacco history on file. She reports that she does not drink alcohol or use illicit drugs.  Family History: Family History  Problem Relation Age of Onset  . Breast cancer Mother   . Ovarian cancer Mother   . Cancer Mother 76    ovarian  . Heart disease Father     21 at age of death  . Arthritis Father      Physical Exam: Filed Vitals:   02/26/12 1745  BP: 153/70  Pulse: 69  Temp: 98.2 F (36.8 C)  TempSrc: Oral  Resp: 16  Height: 5\' 2"  (1.575 m)  Weight: 50.44 kg (111 lb 3.2 oz)  SpO2: 94%    HEENT-no pallor or icterus. Mucosa seems slightly dry. No evidence of JVD. Carotid arteries have no bruits. CHEST-clinically clear anteriorly however has some scattered rales in the left posterior lobe of the lung. CARDS-S1-S2 no murmur rub or gallop. Telemetry reviewed showed normal sinus rhythm with a rate of about 60 ABD-soft however slightly tender in the lower abdominal quadrant. No rebound or guarding. Pulsatile mass noted midline. SKIN-no rashes or edema noted lower extremity NEURO-alert, oriented x3 speech: normal in context and clarity memory: intact grossly cranial nerves II-XII: intact motor strength: full proximally and distally no involuntary movements or tremors cerebellar: finger-to-nose and heel-to-shin intact reflexes: full and symmetric plantar responses: downgoing bilaterally Gait not assessed   Labs on Admission:  No results found for this basename: NA:2,K:2,CL:2,CO2:2,GLUCOSE:2,BUN:2,CREATININE:2,CALCIUM:2,MG:2,PHOS:2 in the last 72 hours No results found for this basename: AST:2,ALT:2,ALKPHOS:2,BILITOT:2,PROT:2,ALBUMIN:2 in the last 72 hours No results found for this basename: LIPASE:2,AMYLASE:2 in the last 72 hours No results found for this basename: WBC:2,NEUTROABS:2,HGB:2,HCT:2,MCV:2,PLT:2 in the last 72 hours No results found for this basename: CKTOTAL:3,CKMB:3,CKMBINDEX:3,TROPONINI:3 in the last 72 hours No results found for this basename: TSH,T4TOTAL,FREET3,T3FREE,THYROIDAB in the last 72 hours No results found for this basename: VITAMINB12:2,FOLATE:2,FERRITIN:2,TIBC:2,IRON:2,RETICCTPCT:2 in the last 72 hours  Radiological Exams on Admission: No results found.  Assessment/Plan #1 persistent acute on chronic hyponatremia-patient likely has hyponatremia  with hypertonicity secondary to possible continued water intake and low input of solutes. We will get to confirm this a serum osmolality, Spot urine sodium spot urine creatinine.  Will start with IV fluids 50 cc per hour for 18 hours to correct the underlying abnormality and we will draw labs prior to getting these studies done.  I will liberalize her heart healthy diet to regular diet as has already been done by Dr. Claris Che office and increase sodium that she's taking in.  Further labs and workup pending and will be reviewed by attending physician in the morning-relatively low yield in getting triglyceride levels, however should be noted she has allergies to statins and does not take them-she currently is not on any offending agents that he would consider her at risk for causing hyponatremia, however there is a chance possibility of metoprolol causing this this will need to be followed carefully.  Would get nephrology assistance in alleviating her care if her hyponatremia persists, however I have no labs to determine currently her trends  #2 history of coronary artery disease status post CABG-continue metoprolol 12.5 mg every 24 hourly, aspirin 81 mg daily, Plavix 75 mg daily.   #3 history of lumbar compression fractures-she describes radiating lancinating pain down the left side of her leg to the inside part of her instep on the left side. She describes as a burning 7 on 10 type of pain which is constant. This is me to believe that this is more neuropathic in origin. Patient already is on carbamazepine 100 mg 3 times a day, Tegretol 100 mg twice a day-patient might benefit from low-dose opiate versus up titration of her Lyrica however this may be associated with untoward confusion side effects as well and I hold off on this for now. Patient is to see Dr. Nicki Reaper for possible MRI of the lower back to determine levels need to be operated upon and no imaging is warranted at this current time-this is a difficult  situation and time was epnt detailing non-escalation at this time on her therapies given significant drug side effects at her last in-patient hopsitalization.  #4-recent history of UTI. Patient had a test of cure that was done yesterday which was negative however is tender in lower abdominal region. I will add a urinalysis and if this comes back positive with in and out cath then we will once again determine if she needs further or longer course of treatment. Patient's daughter describes some hesitancy and incomplete voiding and apparently has had urodynamic studies in the past. Please follow the urinalysis. We will get a CBC as well.  #5 Margaretha Sheffield history patient has  been seen by neurology for this-continue the carbamazepine and Lyrica that she is on at current dosages  #6 history of IBS-followed by Dr. Madilyn Fireman. We will get a CBC and follow trends of her blood counts as this may also account for some of her nonspecific findings as below  #7 failure to thrive-possibly multifactorial-she has possible hyponatremia and might have some borderline anemia in addition to a recent UTI and a possible concrement and pneumonia process that she has been suffered from currently she is afebrile and is not complaining of a cough or cold, at present so I will hold off on further imaging studies at present time.   Patient is presumed full code, Discussed fully in detail with Daughter . next of kin in room, Ivor Costa (c) [phone] >60 time spent admitting this patient  [facility] 02/26/2012, 6:16 PM

## 2012-02-26 NOTE — Progress Notes (Signed)
Addend  Labs show sodium=126 [likely chronic over past 1-2 weeks] Calculated OSM= 2[sodium]+[glucose]/18+[bun]/2.8      = 2[126]+[102]/18+[8]/2.8                 = 252+5.6+2.8                            = 260 Regular range is 280-295, and although U Osm not back, this is likely 2/2 to Hyponatremia with Hypotonicity This effectively rules out pseudohyponatremia  Would probably not need TG levels, especially if rpt sodium in am better.  If needed would restrict water intake in am to 1 liter /24 hours. In light of persisting deficit in oral intake, will get Nutrition and speech consult as likely etiology tea-toast diet  Pleas Koch, MD [facility] [phone]

## 2012-02-27 ENCOUNTER — Inpatient Hospital Stay (HOSPITAL_COMMUNITY): Payer: Medicare Other

## 2012-02-27 DIAGNOSIS — E871 Hypo-osmolality and hyponatremia: Secondary | ICD-10-CM

## 2012-02-27 DIAGNOSIS — I059 Rheumatic mitral valve disease, unspecified: Secondary | ICD-10-CM

## 2012-02-27 DIAGNOSIS — M545 Low back pain: Secondary | ICD-10-CM

## 2012-02-27 DIAGNOSIS — R350 Frequency of micturition: Secondary | ICD-10-CM

## 2012-02-27 LAB — BASIC METABOLIC PANEL
GFR calc Af Amer: >90 mL/min (ref >90)
GFR calc non Af Amer: >90 mL/min (ref >90)
Glucose, Bld: 99 mg/dL (ref 70–99)
Potassium: 4 mEq/L (ref 3.5–5.1)
Sodium: 128 mEq/L — ABNORMAL LOW (ref 135–145)

## 2012-02-27 LAB — URINALYSIS, ROUTINE W REFLEX MICROSCOPIC
Glucose, UA: NEGATIVE mg/dL
Hgb urine dipstick: NEGATIVE
Protein, ur: NEGATIVE mg/dL
pH: 6.5 (ref 5.0–8.0)

## 2012-02-27 LAB — CREATININE, URINE, RANDOM: Creatinine, Urine: 64.7 mg/dL

## 2012-02-27 LAB — URINE MICROSCOPIC-ADD ON

## 2012-02-27 LAB — TSH: TSH: 1.157 u[IU]/mL (ref 0.350–4.500)

## 2012-02-27 LAB — OSMOLALITY, URINE: Osmolality, Ur: 317 mOsm/kg — ABNORMAL LOW (ref 390–1090)

## 2012-02-27 LAB — CBC
Hemoglobin: 12.9 g/dL (ref 12.0–15.0)
RBC: 4.04 MIL/uL (ref 3.87–5.11)
WBC: 4.3 10*3/uL (ref 4.0–10.5)

## 2012-02-27 LAB — OSMOLALITY: Osmolality: 257 mOsm/kg — ABNORMAL LOW (ref 275–300)

## 2012-02-27 LAB — SODIUM, URINE, RANDOM: Sodium, Ur: 42 mEq/L

## 2012-02-27 MED ORDER — FUROSEMIDE 20 MG PO TABS
20.0000 mg | ORAL_TABLET | Freq: Every day | ORAL | Status: DC
  Administered 2012-02-27 – 2012-02-29 (×3): 20 mg via ORAL
  Filled 2012-02-27 (×4): qty 1

## 2012-02-27 MED ORDER — HYOSCYAMINE SULFATE 0.125 MG SL SUBL
0.1250 mg | SUBLINGUAL_TABLET | Freq: Three times a day (TID) | SUBLINGUAL | Status: DC
  Administered 2012-02-27 – 2012-03-02 (×12): 0.125 mg via SUBLINGUAL
  Filled 2012-02-27 (×15): qty 1

## 2012-02-27 MED ORDER — TRAMADOL HCL 50 MG PO TABS
50.0000 mg | ORAL_TABLET | Freq: Four times a day (QID) | ORAL | Status: DC | PRN
  Administered 2012-02-27 – 2012-03-01 (×8): 50 mg via ORAL
  Filled 2012-02-27 (×10): qty 1

## 2012-02-27 MED ORDER — BOOST PLUS PO LIQD
237.0000 mL | Freq: Every day | ORAL | Status: DC
  Administered 2012-02-27 – 2012-03-01 (×4): 237 mL via ORAL
  Filled 2012-02-27 (×5): qty 237

## 2012-02-27 MED ORDER — CALCIUM CARBONATE 1250 (500 CA) MG PO TABS
1.0000 | ORAL_TABLET | Freq: Every day | ORAL | Status: DC
  Administered 2012-02-27 – 2012-03-02 (×5): 500 mg via ORAL
  Filled 2012-02-27 (×5): qty 1

## 2012-02-27 NOTE — Progress Notes (Signed)
Pt post void bladder scan was 219 ml.  Pt remained on BSC for 25 minutes in order to finish voiding.

## 2012-02-27 NOTE — Progress Notes (Signed)
   CARE MANAGEMENT NOTE 02/27/2012  Patient:  Sabrina Moore, Sabrina Moore   Account Number:  [id number]  Date Initiated:  02/27/2012  Documentation initiated by:  Jiles Crocker  Subjective/Objective Assessment:   ADMITTED WITH HYPONATREMIA, FAILURE TO THRIVE     Action/Plan:   PCP: Elby Showers, MD,  Cardiologist=Brackbill  Neurologist=Penumalli; LIVES WITH SPOUSE; RECEIVING HHC WITH [facility]   Anticipated DC Date:  03/05/2012   Anticipated DC Plan:  HOME W HOME HEALTH SERVICES          Status of service:  In process, will continue to follow Medicare Important Message given?  NA - LOS <3 / Initial given by admissions (If response is "NO", the following Medicare IM given date fields will be blank)   Per UR Regulation:  Reviewed for med. necessity/level of care/duration of stay  Comments:  02/27/2012- B Retaj Hilbun RN, BSN, MHA

## 2012-02-27 NOTE — Progress Notes (Signed)
INITIAL ADULT NUTRITION ASSESSMENT Date: 02/27/2012   Time: 10:52 AM Reason for Assessment: Consult for patient appears malnourished  ASSESSMENT: Female 78 y.o.  Dx: Hyponatremia  Hx:  Past Medical History  Diagnosis Date  . IHD (ischemic heart disease)     Stents placed in 2012?0-on plavix and asa   . Chest discomfort   . Abnormal stress ECG with treadmill 02/01/10  . Dyslipidemia     Intolerant to statins  . [facility]     [facility]  . IDA (iron deficiency anemia)     NO scopes recently? was controverial  . IBS (irritable bowel syndrome)     Seen by Dr. [name] Jan 2013-advised not to do this  . Fatigue   . Anxiety   . Hypertension   . Hypercholesterolemia   . Pneumonia   . Angina   . Shingles     End of august 2012-post-herpeticv Neuralgia    Related Meds:  Scheduled Meds:   . acidophilus  1 capsule Oral Daily  . ALPRAZolam  0.125 mg Oral BID  . ALPRAZolam  0.25 mg Oral QHS  . aspirin EC  81 mg Oral Daily  . calcium carbonate  1 tablet Oral Daily  . carbamazepine  100 mg Oral TID  . citalopram  10 mg Oral Daily  . clopidogrel  75 mg Oral Q breakfast  . docusate sodium  100 mg Oral Daily  . enoxaparin (LOVENOX) injection  40 mg Subcutaneous Q24H  . famotidine  10 mg Oral BID  . ferrous sulfate  325 mg Oral Q breakfast  . hyoscyamine  0.125 mg Oral TID  . metoprolol succinate  12.5 mg Oral Daily  . pantoprazole  40 mg Oral QHS  . pneumococcal 23 valent vaccine  0.5 mL Intramuscular Once  . pregabalin  100 mg Oral BID  . sodium chloride  1 g Oral Daily  . DISCONTD: ALPRAZolam  0.125-0.25 mg Oral See admin instructions  . DISCONTD: aspirin  81 mg Oral Daily  . DISCONTD: calcium carbonate  600 mg Oral Daily  . DISCONTD: enoxaparin  30 mg Subcutaneous Q24H   Continuous Infusions:   . sodium chloride 50 mL/hr at 02/26/12 2000   PRN Meds:.bismuth subsalicylate, traMADol   Ht: 5\' 2"  (157.5 cm)  Wt: 112 lb 14 oz (51.2 kg)  Ideal Wt: 50.1 kg   % Ideal Wt: 101.8%  Wt Readings from Last 10 Encounters:  02/27/12 112 lb 14 oz (51.2 kg)  12/10/11 112 lb (50.803 kg)  08/12/11 102 lb (46.267 kg)  06/25/11 106 lb (48.081 kg)  06/21/11 109 lb (49.442 kg)  06/10/11 112 lb (50.803 kg)  06/07/11 113 lb (51.256 kg)  05/13/11 110 lb (49.896 kg)  04/18/11 110 lb (49.896 kg)  03/19/11 112 lb (50.803 kg)   Usual Wt: 110 lb per patient % Usual Wt: 101.8%  Body mass index is 20.65 kg/(m^2). (WNL)  Food/Nutrition Related Hx: Patient reports appetite is ok. She stated over the last 1.5 weeks appetite and intake have been poor. She reported she only has difficulty chewing tough meats. She reported she sometimes drinks Boost nutrition supplement. She voiced snack preferences.  Labs:  CMP     Component Value Date/Time   NA 128* 02/27/2012 0515   K 4.0 02/27/2012 0515   CL 93* 02/27/2012 0515   CO2 28 02/27/2012 0515   GLUCOSE 99 02/27/2012 0515   BUN 8 02/27/2012 0515   CREATININE 0.44* 02/27/2012 0515   CALCIUM 8.3*  02/27/2012 0515   PROT 5.9* 02/26/2012 1940   ALBUMIN 3.3* 02/26/2012 1940   AST 19 02/26/2012 1940   ALT 16 02/26/2012 1940   ALKPHOS 95 02/26/2012 1940   BILITOT 0.2* 02/26/2012 1940   GFRNONAA >90 02/27/2012 0515   GFRAA >90 02/27/2012 0515    Intake/Output Summary (Last 24 hours) at 02/27/12 1054 Last data filed at 02/27/12 0600  Gross per 24 hour  Intake    550 ml  Output    965 ml  Net   -415 ml     Diet Order: General  Supplements/Tube Feeding: none at this time  IVF:    sodium chloride Last Rate: 50 mL/hr at 02/26/12 2000    Estimated Nutritional Needs:   Kcal: 4696-2952 Protein: 56-66 grams Fluid:  1L fluid restriction per MD  NUTRITION DIAGNOSIS: -Inadequate oral intake (NI-2.1).  Status: Ongoing  RELATED TO: poor appetite   AS EVIDENCE BY: patient reports poor appetite and intake over the last 1.5 weeks.   MONITORING/EVALUATION(Goals): PO intake, weight trends, labs, I/O's 1. PO intake > 75% at  meals, snacks and supplements.  2. Promote weight maintenance.   EDUCATION NEEDS: -No education needs identified at this time  INTERVENTION: 1. Will order patient Boost nutrition supplement once daily.  2. Will order patient snacks once daily to increase PO intake.  3. RD to follow for nutrition plan of care.   Dietitian [phone]  DOCUMENTATION CODES Per approved criteria  -Not Applicable    Iven Finn [facility] 02/27/2012, 10:52 AM

## 2012-02-27 NOTE — Evaluation (Signed)
Physical Therapy Evaluation Patient Details Name: Sabrina Moore MRN: [id number] DOB: 1927/06/16 Today's Date: 02/27/2012 Time: 1410-1430 PT Time Calculation (min): 20 min  PT Assessment / Plan / Recommendation Clinical Impression  Pt presents with diagnosis of hyponatremia. Pt also has history of falls and compression fracture with pain radiating down left leg. Pt fatigues very quickly/easily which increases risk for falls on top of previous balance issues.Pt performed well at beginning of session but midway through session pt began to fatigue. Had to be wheeled back to room in recliner.Pt became very exhausted and also reported increased pain after activity as well. Discussed possible need for ST rehab  unless pt progresses well.     PT Assessment  Patient needs continued PT services    Follow Up Recommendations  Skilled nursing facility (unless pt progresses well and appropriate assist at home, then HHPT 24 hour supervison/assist)    Barriers to Discharge        lEquipment Recommendations  None recommended by PT    Recommendations for Other Services OT consult   Frequency Min 3X/week    Precautions / Restrictions Precautions Precautions: Fall Precaution Comments: compression fracture(s) with pain down L leg Restrictions Weight Bearing Restrictions: No   Pertinent Vitals/Pain       Mobility  Bed Mobility Bed Mobility: Supine to Sit;Sit to Supine Supine to Sit: 4: Min guard;HOB flat Sit to Supine: 3: Mod assist;HOB flat Details for Bed Mobility Assistance: Increased assist for back to bed due to pt too fatigued/weak to perform.  Transfers Transfers: Sit to Stand;Stand to Sit;Stand Pivot Transfers Sit to Stand: 3: Mod assist;With upper extremity assist;From bed;From chair/3-in-1 Stand to Sit: 3: Mod assist;With upper extremity assist;With armrests;To chair/3-in-1;To bed Stand Pivot Transfers: 3: Mod assist Details for Transfer Assistance: x 2. 1st time, pt was Min A but  for 2nd time pt required increased assist due to fatigue, weakness. Stand pivot recliener>bed at end of session at pt's request due to pain, fatigue.  Ambulation/Gait Ambulation/Gait Assistance: 4: Min assist Ambulation Distance (Feet): 45 Feet Assistive device: Rolling walker Ambulation/Gait Assistance Details: Followed with chair for safety. Pt unsteady but no LOB with ambulation. Fatigues easily.  Gait Pattern: Step-through pattern;Decreased step length - left;Decreased step length - right;Decreased stride length    Exercises     PT Diagnosis: Difficulty walking;Generalized weakness;Acute pain  PT Problem List: Decreased strength;Decreased activity tolerance;Decreased balance;Decreased mobility;Pain PT Treatment Interventions: DME instruction;Gait training;Stair training;Functional mobility training;Therapeutic activities;Therapeutic exercise;Balance training;Patient/family education   PT Goals Acute Rehab PT Goals PT Goal Formulation: With patient Time For Goal Achievement: 03/12/12 Potential to Achieve Goals: Good Pt will go Supine/Side to Sit: with supervision PT Goal: Supine/Side to Sit - Progress: Goal set today Pt will go Sit to Supine/Side: with supervision PT Goal: Sit to Supine/Side - Progress: Goal set today Pt will go Sit to Stand: with supervision PT Goal: Sit to Stand - Progress: Goal set today Pt will Ambulate: 51 - 150 feet;with supervision;with rolling walker PT Goal: Ambulate - Progress: Goal set today  Visit Information  Last PT Received On: 02/27/12 Assistance Needed: +2 (safety)pt fatigues quickly/easily    Subjective Data  Subjective: "I'm jsut so weak" Patient Stated Goal: Get better. Home   Prior Functioning  Home Living Lives With: Spouse Available Help at Discharge: Family;Personal care attendant ([phone], [phone](granddaughter), [phone]) Type of Home: House Home Access: Stairs to enter [employer] of Steps: 3 Entrance Stairs-Rails:  Right Home Layout: One level Home Adaptive Equipment: Walker - rolling;Bedside  commode/3-in-1 Prior Function Level of Independence: Needs assistance Needs Assistance: Bathing;Dressing;Light Housekeeping;Meal Prep Able to Take Stairs?: Yes Comments: pt reports progressive decline over last couple of weeks. Also reported overnight care since last week Communication Communication: No difficulties    Cognition  Overall Cognitive Status: Appears within functional limits for tasks assessed/performed Arousal/Alertness: Awake/alert Orientation Level: Appears intact for tasks assessed Behavior During Session: [facility] for tasks performed    Extremity/Trunk Assessment Right Lower Extremity Assessment RLE ROM/Strength/Tone: Deficits RLE ROM/Strength/Tone Deficits: Strength at least 4/5 with funcitonal activity RLE Coordination: WFL - gross motor Left Lower Extremity Assessment LLE ROM/Strength/Tone: Deficits LLE ROM/Strength/Tone Deficits: Strength at least 4/5 with functional activity LLE Coordination: WFL - gross motor Trunk Assessment Trunk Assessment: Normal   Balance    End of Session PT - End of Session Equipment Utilized During Treatment: Gait belt Activity Tolerance: Patient limited by fatigue Patient left: in bed;with call bell/phone within reach   Rebeca Alert [facility] 02/27/2012, 2:54 PM [phone]

## 2012-02-27 NOTE — Evaluation (Signed)
Clinical/Bedside Swallow Evaluation Patient Details  Name: ANDERIA LORENZO MRN: [id number] Date of Birth: 03-20-1927  Today's Date: 02/27/2012 Time: 4098-1191 SLP Time Calculation (min): 31 min  Past Medical History:  Past Medical History  Diagnosis Date  . IHD (ischemic heart disease)     Stents placed in 2012?0-on plavix and asa   . Chest discomfort   . Abnormal stress ECG with treadmill 02/01/10  . Dyslipidemia     Intolerant to statins  . [facility]ovagal [facility]ope     [facility]  . IDA (iron deficiency anemia)     NO scopes recently? was controverial  . IBS (irritable bowel syndrome)     Seen by Dr. [name] Jan 2013-advised not to do this  . Fatigue   . Anxiety   . Hypertension   . Hypercholesterolemia   . Pneumonia   . Angina   . Shingles     End of august 2012-post-herpeticv Neuralgia   Past Surgical History:  Past Surgical History  Procedure Date  . Coronary artery bypass graft 1996  . Cardiac catheterization 02/09/10  . Appendectomy 1967  . Tonsillectomy 1933  . Oophorectomy 1967  . Eye surgery 2009    catarac  . Squamous cell carcinoma excision 2012    from nose    HPI:  Pt is a pleasant 78 year old female admitted for hyponatremia. Though MD reports pt on a "tea and toast" diet, Pt and family describe a well rounded diet of meats, fuits and vegetable wtih each meal. Pt does report frequent feeling of food getting stuck consistent with finding of esophageal dysmotility on barium swallow. Pt is cognizant of problem and avoids dry particulate foods independently. She did have an episode of globus prior to admit accompanied by anxiety and SOB. Pt also reports pain in bilateral anterior 1/3 of tongue at all times, not only with PO intake. 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    Assessment / Plan / Recommendation Clinical Impression  Pt presents with complaints consistent with a mild primary esophageal dysphagia, prviously  diagnosed with esophageal dysmotility on barium swallow. Pt with no overt s/s of aspiration. Pt manages this dysphagia independently, utilizing strategies and avoiding dry particulate foods apropriately. Pt and daughter manage PO intake and provide well rounded meals per thair report. SLp offered futher strategy of alternating foods with sips of warm water/tea at meals to encourage transit and esophagel relaxation. Lingual pain suspicious for involvement of CN VII as this nerve controls sensation for anterior 2/3 of tongue. No SLP f/u at this time, continue current diet.     Aspiration Risk  Mild    Diet Recommendation Regular;Thin liquid   Other  Recommendations     Follow Up Recommendations  None    Frequency and Duration        Pertinent Vitals/Pain NA    SLP Swallow Goals     Swallow Study Prior Functional Status       General HPI: Pt is a pleasant 78 year old female admitted for hyponatremia. Though MD reports pt on a "tea and toast" diet, Pt and family describe a well rounded diet of meats, fuits and vegetable wtih each meal. Pt does report frequent feeling of food getting stuck consistent with finding of esopahgeal dysmotility on barium swallow. Pt is cognizant of problem and avoids dry particulate foods independently. She did have an episode of globus prior to admit accompanied by anxiety and SOB. Pt also reports  pain in bilateral anterior 1/3 of tongue at all times, not only with PO intake. 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  Type of Study: Bedside swallow evaluation Diet Prior to this Study: Regular Temperature Spikes Noted: No Respiratory Status: Room air Behavior/Cognition: Alert;Cooperative;Pleasant mood Oral Cavity - Dentition: Adequate natural dentition Vision: Functional for self-feeding Patient Positioning: Upright in chair Baseline Vocal Quality: Clear Volitional Cough: Strong Volitional Swallow: Able to elicit      Oral/Motor/Sensory Function Overall Oral Motor/Sensory Function: Appears within functional limits for tasks assessed   Ice Chips     Thin Liquid Thin Liquid: Within functional limits    Nectar Thick Nectar Thick Liquid: Not tested   Honey Thick Honey Thick Liquid: Not tested   Puree Puree: Within functional limits   Solid Solid: Within functional limits   [facility], MA CCC-SLP [phone]  Claudine Mouton 02/27/2012,9:34 AM

## 2012-02-27 NOTE — Progress Notes (Signed)
Subjective:   Chart reviewed. Discussed at length with patient and her daughter at the bedside. Patient says she feels slightly better but is overall weak. She says she has been eating a balanced diet since her open heart surgery years ago and there was a transient decrease in appetite during the recent episode of urinary tract infection but her appetite has returned to baseline. She complains of chronic lower back pain with radiation to her left lower extremity. She complains of urinary frequency, difficulty and incomplete emptying. She denies dysuria. Her daughter indicates that she has been giving her sodium tablets to help with low sodium levels. She has an appointment to see a urologist in 4-6 weeks. Patient was seeing things 2 days ago but has since resolved. She seems to be coherent today according to her daughter.  Objective  Vital signs in last 24 hours: Filed Vitals:   02/26/12 1745 02/26/12 2227 02/27/12 0500 02/27/12 1349  BP: 153/70 113/63 134/67 130/62  Pulse: 69 71 66 67  Temp: 98.2 F (36.8 C) 97.9 F (36.6 C) 97.9 F (36.6 C) 98.1 F (36.7 C)  TempSrc: Oral Oral Oral Oral  Resp: 16 18 18 20   Height: 5\' 2"  (1.575 m)     Weight: 50.44 kg (111 lb 3.2 oz)  51.2 kg (112 lb 14 oz)   SpO2: 94% 93% 95% 98%   Weight change:   Intake/Output Summary (Last 24 hours) at 02/27/12 1822 Last data filed at 02/27/12 1639  Gross per 24 hour  Intake    910 ml  Output   2265 ml  Net  -1355 ml    Physical Exam:  General Exam: Comfortable.  Respiratory System: Clear. No increased work of breathing.  Cardiovascular System: First and second heart sounds heard. Regular rate and rhythm. No JVD/murmurs/pedal edema.  Gastrointestinal System: Abdomen is non distended, soft and normal bowel sounds heard. Nontender. Central Nervous System: Alert and oriented. No focal neurological deficits. Extremities: Symmetric 5 x 5 power.  Labs:  Basic Metabolic Panel:  Lab 02/27/12 1610 02/26/12  1940  NA 128* 126*  K 4.0 4.3  CL 93* 88*  CO2 28 31  GLUCOSE 99 102*  BUN 8 8  CREATININE 0.44* 0.47*  CALCIUM 8.3* 8.6  ALB -- --  PHOS -- --   Liver Function Tests:  Lab 02/26/12 1940  AST 19  ALT 16  ALKPHOS 95  BILITOT 0.2*  PROT 5.9*  ALBUMIN 3.3*   No results found for this basename: LIPASE:3,AMYLASE:3 in the last 168 hours No results found for this basename: AMMONIA:3 in the last 168 hours CBC:  Lab 02/27/12 0515 02/26/12 1940  WBC 4.3 5.3  NEUTROABS -- --  HGB 12.9 13.7  HCT 36.5 38.6  MCV 90.3 90.4  PLT 230 266   Cardiac Enzymes: No results found for this basename: CKTOTAL:5,CKMB:5,CKMBINDEX:5,TROPONINI:5 in the last 168 hours CBG: No results found for this basename: GLUCAP:5 in the last 168 hours  Iron Studies: No results found for this basename: IRON,TIBC,TRANSFERRIN,FERRITIN in the last 72 hours Studies/Results: Dg Chest 2 View  02/27/2012  *RADIOLOGY REPORT*  Clinical Data: 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  The lungs are slightly hyperaerated.  Mediastinal contours appear normal.  The heart is mildly enlarged.  Median sternotomy sutures are noted from prior CABG.  The bones are osteopenic.  IMPRESSION: Slight hyperaeration.  No active  lung disease.  Original Report Authenticated By: Juline Patch, M.D.   Medications:    . DISCONTD: sodium chloride 50 mL/hr at 02/26/12 2000      . acidophilus  1 capsule Oral Daily  . ALPRAZolam  0.125 mg Oral BID  . ALPRAZolam  0.25 mg Oral QHS  . aspirin EC  81 mg Oral Daily  . calcium carbonate  1 tablet Oral Daily  . carbamazepine  100 mg Oral TID  . citalopram  10 mg Oral Daily  . clopidogrel  75 mg Oral Q breakfast  . docusate sodium  100 mg Oral Daily  . enoxaparin (LOVENOX) injection  40 mg Subcutaneous Q24H  . famotidine  10 mg Oral BID  . ferrous sulfate  325 mg Oral  Q breakfast  . furosemide  20 mg Oral Daily  . hyoscyamine  0.125 mg Sublingual TID  . lactose free nutrition  237 mL Oral QPC lunch  . metoprolol succinate  12.5 mg Oral Daily  . pantoprazole  40 mg Oral QHS  . pneumococcal 23 valent vaccine  0.5 mL Intramuscular Once  . pregabalin  100 mg Oral BID  . DISCONTD: ALPRAZolam  0.125-0.25 mg Oral See admin instructions  . DISCONTD: aspirin  81 mg Oral Daily  . DISCONTD: calcium carbonate  600 mg Oral Daily  . DISCONTD: enoxaparin  30 mg Subcutaneous Q24H  . DISCONTD: hyoscyamine  0.125 mg Oral TID  . DISCONTD: sodium chloride  1 g Oral Daily    I  have reviewed scheduled and prn medications.     Problem/Plan: Principal Problem:  *Hyponatremia Active Problems:  IHD (ischemic heart disease)  Dyslipidemia  IDA (iron deficiency anemia)  Anxiety  Hypertension  Hx of CABG  Normocytic anemia  Postherpetic neuralgia  1. Hyponatremia, possibly subacute on chronic: On reviewing records, patient has had chronic intermittent hyponatremia in the low 130s over high 120s. 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 Discussed with nephrology/curbside. Etiology of this is not clear but may be secondary to medications such as Tegretol. TSH is normal. Chest x-ray does not show any acute findings (wanted to rule out pulmonary edema or nodules etc.). Will check random cortisol. We'll also check 2-D echo for LV function. As per discussion with nephrology, we will initiate low dose Lasix, discontinue sodium tablets, check orthostatic blood pressures and monitor daily BMPs. If patient's sodium are stable, this can be followed as an outpatient. If sodium persistently remains low then may consider demeclocycline 150 mg by mouth twice a day and titrate up as needed. May also have to consider discontinuing Tegretol and Celexa in changing to alternate medications. CT of the abdomen and pelvis last year did not reveal any acute findings. 2. Low back pain/lumbar compression  fractures with possible radiculopathy: Patient has had chronic pain since late last year which is gradually worsening. Recommend outpatient referred by her primary care physician to orthopedic/spine M.D. for further evaluation and management. 3. Urinary frequency/discomfort: Urinalysis does not suggest of infection. Patient has post void residual of 217 ML. Will discuss with urology to see if we can start her on some medication for hyperactive bladder. 4. Ramsey Hunt  Syndrome: Current Tegretol. 5. Coronary artery disease, status post CABG: Asymptomatic of chest pain. 6. Altered mental status/? Visual [place]: Unclear etiology but resolved. 7. Failure to thrive.  Discussed at length with patient and her daughter at the bedside. Updated care.  Dragan Tamburrino 02/27/2012,6:22 PM  LOS: 1 day

## 2012-02-27 NOTE — Progress Notes (Signed)
OT Note:  Pt just finished with PT and had pain.  Will check back tomorrow.  Filer, OTR/L [phone] 02/27/2012

## 2012-02-27 NOTE — Progress Notes (Signed)
  Echocardiogram 2D Echocardiogram has been performed.  Jorje Guild [facility] 02/27/2012, 4:05 PM

## 2012-02-28 ENCOUNTER — Inpatient Hospital Stay (HOSPITAL_COMMUNITY): Payer: Medicare Other

## 2012-02-28 DIAGNOSIS — M545 Low back pain, unspecified: Secondary | ICD-10-CM

## 2012-02-28 DIAGNOSIS — R350 Frequency of micturition: Secondary | ICD-10-CM

## 2012-02-28 DIAGNOSIS — E871 Hypo-osmolality and hyponatremia: Secondary | ICD-10-CM

## 2012-02-28 LAB — BASIC METABOLIC PANEL
Calcium: 8.8 mg/dL (ref 8.4–10.5)
GFR calc Af Amer: >90 mL/min (ref >90)
GFR calc non Af Amer: 86 mL/min — ABNORMAL LOW (ref >90)
Glucose, Bld: 107 mg/dL — ABNORMAL HIGH (ref 70–99)
Potassium: 4.1 mEq/L (ref 3.5–5.1)
Sodium: 128 mEq/L — ABNORMAL LOW (ref 135–145)

## 2012-02-28 LAB — CORTISOL: Cortisol, Plasma: 14.7 ug/dL

## 2012-02-28 NOTE — Progress Notes (Signed)
Clinical Social Work Department BRIEF PSYCHOSOCIAL ASSESSMENT 02/28/2012  Patient:  Sabrina Moore, Sabrina Moore     Account Number:  [id number]     Admit date:  02/26/2012  Clinical Social Worker:  Orpah Greek  Date/Time:  02/28/2012 02:13 PM  Referred by:  Physician  Date Referred:  02/28/2012 Referred for  SNF Placement   Other Referral:   Interview type:  Family Other interview type:    PSYCHOSOCIAL DATA Living Status:  HUSBAND Admitted from facility:   Level of care:   Primary support name:  Ivor Costa (daughter) cell#: [phone] Primary support relationship to patient:  CHILD, ADULT Degree of support available:   good    CURRENT CONCERNS Current Concerns  Post-Acute Placement   Other Concerns:    SOCIAL WORK ASSESSMENT / PLAN CSW spoke with patient & daughter, Shawna Orleans re: discharge planning. Noted PT recommended SNF for short-term rehab. Daughter states that patient had been to [facility] in the past and would like to return there if possible.   Assessment/plan status:  Information/Referral to [facility] Other assessment/ plan:   Information/referral to community resources:   CSW completed FL2 and faxed information out to [facility]. Left message with Twanna Hy @ [facility] re: bed offer. Awaiting call back. Anticipating discharge Saturday.    PATIENT'S/FAMILY'S RESPONSE TO PLAN OF CARE: Patient/daughter are agreeable that SNF would be best for patient at this time for short-term rehab.        Unice Bailey, LCSWA [phone]

## 2012-02-28 NOTE — Progress Notes (Signed)
Physical Therapy Treatment Patient Details Name: Sabrina Moore MRN: [id number] DOB: 1927-02-21 Today's Date: 02/28/2012 Time: 4098-1191 PT Time Calculation (min): 29 min  PT Assessment / Plan / Recommendation Comments on Treatment Session  Progressing slowly-decreased assist level and pt able to tolerate more activity this session. Continue to recommend ST rehab to maximize strength, independence, and safety prior to d/c home.     Follow Up Recommendations  Skilled nursing facility    Barriers to Discharge        Equipment Recommendations  None recommended by PT    Recommendations for Other Services    Frequency Min 3X/week   Plan Discharge plan remains appropriate    Precautions / Restrictions Precautions Precautions: Fall Restrictions Weight Bearing Restrictions: No   Pertinent Vitals/Pain     Mobility  Bed Mobility Bed Mobility: Supine to Sit;Sit to Supine Supine to Sit: 4: Min guard Sit to Supine: 4: Min assist Details for Bed Mobility Assistance: Increased time. Assist for LEs onto bed.  Transfers Transfers: Sit to Stand;Stand to Sit Sit to Stand: 4: Min assist Stand to Sit: 4: Min assist Stand Pivot Transfers: 4: Min assist Details for Transfer Assistance: VCs safety, technique, hand placement. Assist to rise, stabiilze, control descent. Ambulation/Gait Ambulation Distance (Feet): 60 Feet (15'x1, 60'x1) Assistive device: Rolling walker Ambulation/Gait Assistance Details: VCs safety, posture, distance from RW. Pt able to increase ambulation distance but continues for fatigue fairly quickly. Followed with reclienr and pt was rolled back the rest of the way to room.  Gait Pattern: Step-through pattern;Decreased step length - left;Decreased step length - right;Trunk flexed    Exercises     PT Diagnosis:    PT Problem List:   PT Treatment Interventions:     PT Goals Acute Rehab PT Goals PT Goal: Supine/Side to Sit - Progress: Progressing toward goal PT Goal:  Sit to Supine/Side - Progress: Progressing toward goal PT Goal: Sit to Stand - Progress: Progressing toward goal PT Goal: Ambulate - Progress: Progressing toward goal  Visit Information  Last PT Received On: 02/28/12 Assistance Needed: +2 (safety) PT/OT Co-Evaluation/Treatment: Yes    Subjective Data  Subjective: "I was just wondering why you both were here" Patient Stated Goal: Get better. Rehab possibly   Cognition  Overall Cognitive Status: Appears within functional limits for tasks assessed/performed Arousal/Alertness: Awake/alert Orientation Level: Appears intact for tasks assessed Behavior During Session: [facility] for tasks performed    Balance     End of Session PT - End of Session Equipment Utilized During Treatment: Gait belt Activity Tolerance: Patient limited by fatigue;Patient limited by pain Patient left: in bed;with call bell/phone within reach    Rebeca Alert [facility] 02/28/2012, 2:53 PM [phone]

## 2012-02-28 NOTE — Progress Notes (Signed)
Subjective:   Patient says she's feeling better. Feels stronger. Slept well last night. Back pain better after pain medications.  Objective  Vital signs in last 24 hours: Filed Vitals:   02/27/12 1349 02/27/12 2003 02/28/12 0640 02/28/12 1409  BP: 130/62 133/70 134/68 114/64  Pulse: 67 66 64 71  Temp: 98.1 F (36.7 C) 98.4 F (36.9 C) 97.5 F (36.4 C)   TempSrc: Oral Oral Oral   Resp: 20 19 16 18   Height:      Weight:      SpO2: 98% 97% 95% 97%   Weight change:   Intake/Output Summary (Last 24 hours) at 02/28/12 1729 Last data filed at 02/28/12 1438  Gross per 24 hour  Intake    750 ml  Output   1375 ml  Net   -625 ml    Physical Exam:  General Exam: Comfortable.  Respiratory System: Clear. No increased work of breathing.  Cardiovascular System: First and second heart sounds heard. Regular rate and rhythm. No JVD/murmurs/pedal edema.  Gastrointestinal System: Abdomen is non distended, soft and normal bowel sounds heard. Nontender. Central Nervous System: Alert and oriented. No focal neurological deficits. Extremities: Symmetric 5 x 5 power.  Labs:  Basic Metabolic Panel:  Lab 02/28/12 4098 02/27/12 0515 02/26/12 1940  NA 128* 128* 126*  K 4.1 4.0 4.3  CL 90* 93* 88*  CO2 29 28 31   GLUCOSE 107* 99 102*  BUN 11 8 8   CREATININE 0.52 0.44* 0.47*  CALCIUM 8.8 8.3* 8.6  ALB -- -- --  PHOS -- -- --   Liver Function Tests:  Lab 02/26/12 1940  AST 19  ALT 16  ALKPHOS 95  BILITOT 0.2*  PROT 5.9*  ALBUMIN 3.3*   No results found for this basename: LIPASE:3,AMYLASE:3 in the last 168 hours No results found for this basename: AMMONIA:3 in the last 168 hours CBC:  Lab 02/27/12 0515 02/26/12 1940  WBC 4.3 5.3  NEUTROABS -- --  HGB 12.9 13.7  HCT 36.5 38.6  MCV 90.3 90.4  PLT 230 266   Cardiac Enzymes: No results found for this basename: CKTOTAL:5,CKMB:5,CKMBINDEX:5,TROPONINI:5 in the last 168 hours CBG: No results found for this basename: GLUCAP:5 in  the last 168 hours  Iron Studies: No results found for this basename: IRON,TIBC,TRANSFERRIN,FERRITIN in the last 72 hours Studies/Results: Dg Chest 2 View  02/27/2012  *RADIOLOGY REPORT*  Clinical Data: 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  The lungs are slightly hyperaerated.  Mediastinal contours appear normal.  The heart is mildly enlarged.  Median sternotomy sutures are noted from prior CABG.  The bones are osteopenic.  IMPRESSION: Slight hyperaeration.  No active lung disease.  Original Report Authenticated By: Juline Patch, M.D.   Dg Lumbar Spine Complete  02/28/2012  *RADIOLOGY REPORT*  Clinical Data: 78 year old female status post fall with pain.  LUMBAR SPINE - COMPLETE 4+ VIEW  Comparison: CT abdomen pelvis 06/12/2011 and earlier.  Findings: Chronic L4 compression fracture with mild additional loss of height since 2012.  New L3 inferior endplate compression deformity with mild sclerosis.  The L5 and remaining lumbar levels appear stable.  Chronic lumbar disc space loss, advanced at L4-L5. Dextroconvex scoliosis appears increased. Stable left hemi pelvis phlebolith.  IMPRESSION: 1.  L3 inferior endplate compression fracture is new since 06/12/2011.  If specific therapy such as vertebroplasty is desired, nuclear medicine bone  scan or lumbar MRI would best determine acuity. 2.  Chronic L4 compression fracture with mild additional loss of height since 2012.  Per CMS PQRS reporting requirements (PQRS Measure 24): Given the patient's age of greater than 50 and the fracture site (hip, distal radius, or spine), the patient should be tested for osteoporosis using DXA, and the appropriate treatment considered based on the DXA results.  Original Report Authenticated By: Harley Hallmark, M.D.   Medications:      . acidophilus  1 capsule Oral Daily  . ALPRAZolam  0.125 mg  Oral BID  . ALPRAZolam  0.25 mg Oral QHS  . aspirin EC  81 mg Oral Daily  . calcium carbonate  1 tablet Oral Daily  . carbamazepine  100 mg Oral TID  . citalopram  10 mg Oral Daily  . clopidogrel  75 mg Oral Q breakfast  . docusate sodium  100 mg Oral Daily  . enoxaparin (LOVENOX) injection  40 mg Subcutaneous Q24H  . famotidine  10 mg Oral BID  . ferrous sulfate  325 mg Oral Q breakfast  . furosemide  20 mg Oral Daily  . hyoscyamine  0.125 mg Sublingual TID  . lactose free nutrition  237 mL Oral QPC lunch  . metoprolol succinate  12.5 mg Oral Daily  . pantoprazole  40 mg Oral QHS  . pneumococcal 23 valent vaccine  0.5 mL Intramuscular Once  . pregabalin  100 mg Oral BID    I  have reviewed scheduled and prn medications.     Problem/Plan: Principal Problem:  *Hyponatremia Active Problems:  IHD (ischemic heart disease)  Dyslipidemia  IDA (iron deficiency anemia)  Anxiety  Hypertension  Hx of CABG  Normocytic anemia  Postherpetic neuralgia  1. Hyponatremia, possibly subacute on chronic: On reviewing records, patient has had chronic intermittent hyponatremia in the low 130s over high 120s. 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 Discussed with nephrology/curbside on 5/16. Etiology of this is not clear but may be secondary to medications such as Tegretol. TSH is normal. Chest x-ray does not show any acute findings (wanted to rule out pulmonary edema or nodules etc.). Random cortisol normal. 2-D echo shows mild LVH. As per discussion with nephrology, low dose Lasix initiated on 5/16. Follow BMP tomorrow. If sodium persistently remains low then may consider demeclocycline 150 mg by mouth twice a day and titrate up as needed. May also have to consider discontinuing Tegretol and Celexa and changing to alternate medications. CT of the abdomen and pelvis last year did not reveal any acute findings. 2. Low back pain/lumbar compression fractures with possible radiculopathy: Patient has had  chronic pain since late last year which is gradually worsening. Recommend outpatient referral by her primary care physician to orthopedic/spine M.D. for further evaluation and management. Will get x-rays of the lumbar spine to evaluate. 3. Urinary frequency/discomfort: Urinalysis does not suggest of infection. Patient has post void residual of 217 ML. Discussed with Urology who advise starting no new medication but will arrange for early followup in their office for urodynamic studies and further management. 4. Ramsey Hunt  Syndrome: Continue Tegretol. 5. Coronary artery disease, status post CABG: Asymptomatic of chest pain. 6. Altered mental status/? Visual Revonda Standard Nations: Unclear etiology but resolved. 7. Failure to thrive.  Discussed at length with patient and her daughter at the bedside. Updated care. And they choose skilled nursing facility placement for rehabilitation.  Discussed with [facility] urologist and requested for early followup appointment. They will arrange. Discussed with  patient's primary care M.D. and updated hospital course.  Malachai Schalk 02/28/2012,5:29 PM  LOS: 2 days

## 2012-02-28 NOTE — Evaluation (Signed)
Occupational Therapy Evaluation Patient Details Name: Sabrina Moore MRN: [id number] DOB: 1927/03/05 Today's Date: 02/28/2012 Time: 6962-9528 OT Time Calculation (min): 29 min  OT Assessment / Plan / Recommendation Clinical Impression  This 78 y.o. female admitted with hyponatremia and history of compression fracture.  Pt. demonstrates the below listed deficits and will benefit from OT to maximize safety and independence with BADLs to allow her to return home with min A after SNF level rehab    OT Assessment  Patient needs continued OT Services    Follow Up Recommendations  Supervision/Assistance - 24 hour;Skilled nursing facility    Barriers to Discharge Decreased caregiver support    Equipment Recommendations  None recommended by PT;Defer to next venue    Recommendations for Other Services    Frequency  Min 1X/week    Precautions / Restrictions Precautions Precautions: Fall Precaution Comments: compression fracture(s) with pain down L leg Restrictions Weight Bearing Restrictions: No       ADL  Eating/Feeding: Performed;Independent Where Assessed - Eating/Feeding: Edge of bed Grooming: Performed;Wash/dry hands;Minimal assistance Where Assessed - Grooming: Supported standing Upper Body Bathing: Simulated;Minimal assistance Where Assessed - Upper Body Bathing: Unsupported sitting Lower Body Bathing: Simulated;Minimal assistance Where Assessed - Lower Body Bathing: Supine, head of bed up;Rolling right and/or left;Supported sit to stand Upper Body Dressing: Simulated;Set up Where Assessed - Upper Body Dressing: Unsupported sitting Lower Body Dressing: Performed;Minimal assistance Where Assessed - Lower Body Dressing: Supine, head of bed flat;Unsupported sit to stand (pt. doffed socks supine) Toilet Transfer: Performed;Minimal assistance Toilet Transfer Method: Sit to stand (ambulating to BR) Toilet Transfer Equipment: Comfort height toilet;Grab bars Toileting - Clothing  Manipulation and Hygiene: Performed;Minimal assistance Where Assessed - Glass blower/designer Manipulation and Hygiene: Standing Equipment Used: Rolling walker Transfers/Ambulation Related to ADLs: Pt. ambulated with min A.  +2 present for safety as pt. fatigues quickly and needs to sit. ADL Comments: Pt. instructed in walker safety in bathroom.  Pt. able to access feet by crossing ankles over knees    OT Diagnosis: Generalized weakness;Acute pain  OT Problem List:   OT Treatment Interventions: Self-care/ADL training;DME and/or AE instruction;Therapeutic activities;Patient/family education;Balance training   OT Goals Acute Rehab OT Goals OT Goal Formulation: With patient Time For Goal Achievement: 03/13/12 Potential to Achieve Goals: Good ADL Goals Pt Will Perform Grooming: with supervision;Standing at sink ADL Goal: Grooming - Progress: Goal set today Pt Will Perform Lower Body Bathing: with supervision;Sit to stand from bed;Sit to stand from chair ADL Goal: Lower Body Bathing - Progress: Progressing toward goals Pt Will Perform Lower Body Dressing: with supervision;Sit to stand from chair;Sit to stand from bed ADL Goal: Lower Body Dressing - Progress: Goal set today Pt Will Transfer to Toilet: with supervision;Ambulation;Comfort height toilet ADL Goal: Toilet Transfer - Progress: Goal set today Pt Will Perform Toileting - Clothing Manipulation: with supervision;Standing ADL Goal: Toileting - Clothing Manipulation - Progress: Goal set today  Visit Information  Last OT Received On: 02/28/12 Assistance Needed: +2 (if out of room - pt. fatigues quickly) PT/OT Co-Evaluation/Treatment: Yes    Subjective Data  Subjective: "I get so tired" Patient Stated Goal: To get stronger   Prior Functioning  Home Living Lives With: Spouse Available Help at Discharge: Family;Personal care attendant Type of Home: House Home Access: Stairs to enter [employer] of Steps: 3 Entrance  Stairs-Rails: Right Home Layout: One level Bathroom Toilet: Standard Bathroom Accessibility: Yes How Accessible: Accessible via walker Home Adaptive Equipment: Walker - rolling;Bedside commode/3-in-1 Additional Comments: Pt. currently  is planning for SNF placement Prior Function Level of Independence: Needs assistance Needs Assistance: Bathing;Dressing;Light Housekeeping;Meal Prep Bath: Moderate Dressing: Moderate Meal Prep: Maximal Light Housekeeping: Maximal Able to Take Stairs?: Yes Driving: No Vocation: Retired Comments: Pt. reports a progressive decline in funtions over past few weeks Communication Communication: No difficulties Dominant Hand: Right    Cognition  Overall Cognitive Status: Appears within functional limits for tasks assessed/performed Arousal/Alertness: Awake/alert Orientation Level: Appears intact for tasks assessed Behavior During Session: [facility] for tasks performed    Extremity/Trunk Assessment Right Upper Extremity Assessment RUE ROM/Strength/Tone: Within functional levels RUE Coordination: WFL - gross/fine motor Left Upper Extremity Assessment LUE ROM/Strength/Tone: Within functional levels LUE Coordination: WFL - gross/fine motor   Mobility Bed Mobility Bed Mobility: Supine to Sit;Sit to Supine Supine to Sit: 4: Min guard Sit to Supine: 4: Min assist (due to fatigue) Details for Bed Mobility Assistance: Increased time. Assist for LEs onto bed.  Transfers Transfers: Sit to Stand;Stand to Sit Sit to Stand: 4: Min assist;From bed;With upper extremity assist Stand to Sit: 4: Min assist;To chair/3-in-1;To bed;To toilet;With upper extremity assist Details for Transfer Assistance: VCs safety, technique, hand placement. Assist to rise, stabiilze, control descent.   Exercise    Balance Balance Balance Assessed: Yes Dynamic Standing Balance Dynamic Standing - Balance Support: During functional activity Dynamic Standing - Level of Assistance: 4: Min  assist Dynamic Standing - Balance Activities:  (ADLs)  End of Session OT - End of Session Equipment Utilized During Treatment: Gait belt Activity Tolerance: Patient limited by fatigue;Patient limited by pain Patient left: in bed;with call bell/phone within reach   Daisi Kentner M 02/28/2012, 3:06 PM

## 2012-02-28 NOTE — Progress Notes (Signed)
Clinical Social Work Department CLINICAL SOCIAL WORK PLACEMENT NOTE 02/28/2012  Patient:  Sabrina Moore, Sabrina Moore  Account Number:  [id number] Admit date:  02/26/2012  Clinical Social Worker:  Orpah Greek  Date/time:  02/28/2012 02:21 PM  Clinical Social Work is seeking post-discharge placement for this patient at the following level of care:   SKILLED NURSING   (*CSW will update this form in Epic as items are completed)   02/27/2012  Patient/family provided with [facility] Department of Clinical Social Work's list of facilities offering this level of care within the geographic area requested by the patient (or if unable, by the patient's family).  02/27/2012  Patient/family informed of their freedom to choose among providers that offer the needed level of care, that participate in Medicare, Medicaid or managed care program needed by the patient, have an available bed and are willing to accept the patient.  02/27/2012  Patient/family informed of [facility]' ownership interest in [facility], as well as of the fact that they are under no obligation to receive care at this facility.  PASARR submitted to EDS on 02/27/2012 PASARR number received from [employer] on 02/28/2012  FL2 transmitted to all facilities in geographic area requested by pt/family on  02/27/2012 FL2 transmitted to all facilities within larger geographic area on   Patient informed that his/her managed care company has contracts with or will negotiate with  certain facilities, including the following:     Patient/family informed of bed offers received:  02/28/2012 Patient chooses bed at  Physician recommends and patient chooses bed at    Patient to be transferred to  on   Patient to be transferred to facility by   The following physician request were entered in Epic:   Additional Comments:    Unice Bailey, LCSWA [phone]

## 2012-02-29 DIAGNOSIS — M545 Low back pain: Secondary | ICD-10-CM

## 2012-02-29 DIAGNOSIS — R350 Frequency of micturition: Secondary | ICD-10-CM

## 2012-02-29 DIAGNOSIS — E871 Hypo-osmolality and hyponatremia: Secondary | ICD-10-CM

## 2012-02-29 LAB — BASIC METABOLIC PANEL
BUN: 11 mg/dL (ref 6–23)
Chloride: 87 mEq/L — ABNORMAL LOW (ref 96–112)
GFR calc Af Amer: >90 mL/min (ref >90)
GFR calc non Af Amer: 87 mL/min — ABNORMAL LOW (ref >90)
Potassium: 4 mEq/L (ref 3.5–5.1)
Sodium: 123 mEq/L — ABNORMAL LOW (ref 135–145)

## 2012-02-29 MED ORDER — SODIUM CHLORIDE 1 G PO TABS
2.0000 g | ORAL_TABLET | Freq: Three times a day (TID) | ORAL | Status: DC
  Administered 2012-02-29 – 2012-03-02 (×5): 2 g via ORAL
  Filled 2012-02-29 (×9): qty 2

## 2012-02-29 NOTE — Progress Notes (Signed)
Subjective:   Feels stronger. Ambulating better with assistance. Back pain better controlled.  Objective  Vital signs in last 24 hours: Filed Vitals:   02/28/12 2336 02/29/12 0528 02/29/12 1039 02/29/12 1408  BP: 144/75 131/67 113/62 114/65  Pulse: 75 60 68 65  Temp:  97.6 F (36.4 C)  97.2 F (36.2 C)  TempSrc:  Oral  Oral  Resp: 22 20  20   Height:      Weight:  51.5 kg (113 lb 8.6 oz)    SpO2: 95% 94%  95%   Weight change:   Intake/Output Summary (Last 24 hours) at 02/29/12 1435 Last data filed at 02/29/12 1408  Gross per 24 hour  Intake    480 ml  Output    525 ml  Net    -45 ml    Physical Exam:  General Exam: Comfortable.  Respiratory System: Clear. No increased work of breathing.  Cardiovascular System: First and second heart sounds heard. Regular rate and rhythm. No JVD/murmurs/pedal edema. Telemetry shows sinus rhythm in the 60s to 70s without any arrhythmia alarms. Gastrointestinal System: Abdomen is non distended, soft and normal bowel sounds heard. Nontender. Central Nervous System: Alert and oriented. No focal neurological deficits. Extremities: Symmetric 5 x 5 power.  Labs:  Basic Metabolic Panel:  Lab 02/29/12 1610 02/28/12 0459 02/27/12 0515  NA 123* 128* 128*  K 4.0 4.1 4.0  CL 87* 90* 93*  CO2 28 29 28   GLUCOSE 102* 107* 99  BUN 11 11 8   CREATININE 0.51 0.52 0.44*  CALCIUM 8.7 8.8 8.3*  ALB -- -- --  PHOS -- -- --   Liver Function Tests:  Lab 02/26/12 1940  AST 19  ALT 16  ALKPHOS 95  BILITOT 0.2*  PROT 5.9*  ALBUMIN 3.3*   No results found for this basename: LIPASE:3,AMYLASE:3 in the last 168 hours No results found for this basename: AMMONIA:3 in the last 168 hours CBC:  Lab 02/27/12 0515 02/26/12 1940  WBC 4.3 5.3  NEUTROABS -- --  HGB 12.9 13.7  HCT 36.5 38.6  MCV 90.3 90.4  PLT 230 266   Cardiac Enzymes: No results found for this basename: CKTOTAL:5,CKMB:5,CKMBINDEX:5,TROPONINI:5 in the last 168 hours CBG: No results  found for this basename: GLUCAP:5 in the last 168 hours  Iron Studies: No results found for this basename: IRON,TIBC,TRANSFERRIN,FERRITIN in the last 72 hours Studies/Results: Dg Chest 2 View  02/27/2012  *RADIOLOGY REPORT*  Clinical Data: 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  The lungs are slightly hyperaerated.  Mediastinal contours appear normal.  The heart is mildly enlarged.  Median sternotomy sutures are noted from prior CABG.  The bones are osteopenic.  IMPRESSION: Slight hyperaeration.  No active lung disease.  Original Report Authenticated By: Juline Patch, M.D.   Dg Lumbar Spine Complete  02/28/2012  *RADIOLOGY REPORT*  Clinical Data: 78 year old female status post fall with pain.  LUMBAR SPINE - COMPLETE 4+ VIEW  Comparison: CT abdomen pelvis 06/12/2011 and earlier.  Findings: Chronic L4 compression fracture with mild additional loss of height since 2012.  New L3 inferior endplate compression deformity with mild sclerosis.  The L5 and remaining lumbar levels appear stable.  Chronic lumbar disc space loss, advanced at L4-L5. Dextroconvex scoliosis appears increased. Stable left hemi pelvis phlebolith.  IMPRESSION: 1.  L3 inferior endplate compression fracture is new since 06/12/2011.  If specific  therapy such as vertebroplasty is desired, nuclear medicine bone scan or lumbar MRI would best determine acuity. 2.  Chronic L4 compression fracture with mild additional loss of height since 2012.  Per CMS PQRS reporting requirements (PQRS Measure 24): Given the patient's age of greater than 50 and the fracture site (hip, distal radius, or spine), the patient should be tested for osteoporosis using DXA, and the appropriate treatment considered based on the DXA results.  Original Report Authenticated By: Harley Hallmark, M.D.   Medications:      . acidophilus  1 capsule  Oral Daily  . ALPRAZolam  0.125 mg Oral BID  . ALPRAZolam  0.25 mg Oral QHS  . aspirin EC  81 mg Oral Daily  . calcium carbonate  1 tablet Oral Daily  . carbamazepine  100 mg Oral TID  . clopidogrel  75 mg Oral Q breakfast  . docusate sodium  100 mg Oral Daily  . enoxaparin (LOVENOX) injection  40 mg Subcutaneous Q24H  . famotidine  10 mg Oral BID  . ferrous sulfate  325 mg Oral Q breakfast  . hyoscyamine  0.125 mg Sublingual TID  . lactose free nutrition  237 mL Oral QPC lunch  . metoprolol succinate  12.5 mg Oral Daily  . pantoprazole  40 mg Oral QHS  . pneumococcal 23 valent vaccine  0.5 mL Intramuscular Once  . pregabalin  100 mg Oral BID  . sodium chloride  2 g Oral TID WC  . DISCONTD: citalopram  10 mg Oral Daily  . DISCONTD: furosemide  20 mg Oral Daily    I  have reviewed scheduled and prn medications.     Problem/Plan: Principal Problem:  *Hyponatremia Active Problems:  IHD (ischemic heart disease)  Dyslipidemia  IDA (iron deficiency anemia)  Anxiety  Hypertension  Hx of CABG  Normocytic anemia  Postherpetic neuralgia  1. Hyponatremia, possibly subacute on chronic: Sodium worse today. Nephrology consulted for assistance in evaluation and management. Await their recommendations. 2. Low back pain/lumbar compression fractures with possible radiculopathy: Patient has had chronic pain since late last year which is gradually worsening. Recommend outpatient referral by her primary care physician to orthopedic/spine M.D. for further evaluation and management. Lumbar spine x-ray showed L4 and L3 compression fractures, both probably chronic. According to daughter, L4 fracture was seen November of 2012 and L3 fracture was in February of 2013. Pain better controlled. 3. Urinary frequency/discomfort: Urinalysis does not suggest of infection. Patient has post void residual of 217 ML. Discussed with Urology on 5/17 who advise starting no new medication but will arrange for early  followup in their office for urodynamic studies and further management. 4. Ramsey Hunt  Syndrome: Continue Tegretol. 5. Coronary artery disease, status post CABG: Asymptomatic of chest pain. 6. Altered mental status/? Visual Revonda Standard [place]: Unclear etiology but resolved. 7. Failure to thrive.  Updated patient and patient's daughter at bedside regarding ongoing care. Not for discharge to skilled nursing facility today   Shady Padron 02/29/2012,2:35 PM  LOS: 3 days

## 2012-02-29 NOTE — Progress Notes (Signed)
CSW received call from RN stating patient's discharge is being put on hold due to her sodium being low. Patient & daughter at bedside aware. CSW spoke with Gunnar Fusi @ [facility] to let them know and confirm that discharge on Sunday is possible if patient is medically stable.   Unice Bailey, LCSWA [phone]

## 2012-02-29 NOTE — Consult Note (Addendum)
Sabrina Moore 02/29/2012 Sabrina Moore D Requesting Physician:  Dr. Waymon Amato  Reason for Consult:  Hyponatremia HPI: The patient is a 78 y.o. year-old WF with hx of CAD, prior CABG, HTN, HL, trigeminal herpes zoster with chronic neuropathic pain who was admitted on 02/26/2012 for hyponatremia. Pt's PCP had been trying to manage as outpatient for the last few weeks. She was here with hyponatremia in Aug 2012 at which she was hypovolemic, serum Na was as low as 114 and she improved to 136 at discharge. In October 2012 she was here and sodium levels were 127-133.  From Nov to Jan she was in a SNF with shingles complications and the daughter says that that was when they started her on salt tabs because of low sodium levels.  They started her at 2 gm / week.  Her PCP increased her to 1 gm daily this past week or so because of the low sodium levels. She takes Tegetrol and Lyrica for painful dysesthesias of the face. She started Celexa in March of this year for anxiety/depressive symptoms at home according to the daughter. Pt's husband has a benign vestibular tumor and is losing gait abilities, which, according to the daughter, makes [name] anxious and depressed. Na levels here have been 128 range; sodium tabs were stopped and lasix was started and Na today is 123.     Past Medical History:  Past Medical History  Diagnosis Date  . IHD (ischemic heart disease)     Stents placed in 2012?0-on plavix and asa   . Chest discomfort   . Abnormal stress ECG with treadmill 02/01/10  . Dyslipidemia     Intolerant to statins  . [facility]ovagal syncope     [facility]  . IDA (iron deficiency anemia)     NO scopes recently? was controverial  . IBS (irritable bowel syndrome)     Seen by Dr. [name] Jan 2013-advised not to do this  . Fatigue   . Anxiety   . Hypertension   . Hypercholesterolemia   . Pneumonia   . Angina   . Shingles     End of august 2012-post-herpeticv Neuralgia    Past Surgical History:   Past Surgical History  Procedure Date  . Coronary artery bypass graft 1996  . Cardiac catheterization 02/09/10  . Appendectomy 1967  . Tonsillectomy 1933  . Oophorectomy 1967  . Eye surgery 2009    catarac  . Squamous cell carcinoma excision 2012    from nose     Family History:  Family History  Problem Relation Age of Onset  . Breast cancer Mother   . Ovarian cancer Mother   . Cancer Mother 38    ovarian  . Heart disease Father     64 at age of death  . Arthritis Father    Social History:  reports that she has never smoked. She does not have any smokeless tobacco history on file. She reports that she does not drink alcohol or use illicit drugs.  Allergies:  Allergies  Allergen Reactions  . Amitriptyline   . Crestor (Rosuvastatin Calcium)   . Lescol     Gi symptoms  . Lipitor (Atorvastatin Calcium)   . Lovastatin   . Oxycodone Nausea And Vomiting  . Pravachol   . Procaine Hcl   . Promethazine Hcl     No good  . Shellfish Allergy   . Zocor (Simvastatin)     Home medications: Prior to Admission medications   Medication  Sig Start Date End Date Taking? Authorizing Provider  acetaminophen (TYLENOL) 500 MG tablet Take 500 mg by mouth every 6 (six) hours as needed.   Yes Historical Provider, MD  acidophilus (RISAQUAD) CAPS Take 1 capsule by mouth daily.   Yes Historical Provider, MD  ALPRAZolam (XANAX) 0.25 MG tablet Take 0.125-0.25 mg by mouth See admin instructions. Takes 1/2 tablet of 0.25mg  bid and takes 1 tablet of 0.25mg  at bedtime 06/26/11  Yes Kristian Covey, MD  aspirin 81 MG tablet Take 81 mg by mouth daily.     Yes Historical Provider, MD  bismuth subsalicylate (PEPTO BISMOL) 262 MG chewable tablet Chew 524 mg by mouth as needed.   Yes Historical Provider, MD  calcium carbonate (OS-CAL) 600 MG TABS Take 600 mg by mouth daily.   Yes Historical Provider, MD  carbamazepine (TEGRETOL) 100 MG chewable tablet Chew 100 mg by mouth 3 (three) times daily. 100 mg tid  non chewable   Yes Historical Provider, MD  citalopram (CELEXA) 10 MG tablet Take 10 mg by mouth daily.   Yes Historical Provider, MD  clopidogrel (PLAVIX) 75 MG tablet Take 1 tablet (75 mg total) by mouth daily. 03/06/11  Yes Cassell Clement, MD  docusate sodium (COLACE) 100 MG capsule Take 100 mg by mouth daily.    Yes Historical Provider, MD  famotidine (PEPCID) 10 MG tablet Take 10 mg by mouth 2 (two) times daily.     Yes Historical Provider, MD  ferrous sulfate 325 (65 FE) MG tablet Take 325 mg by mouth daily.   Yes Historical Provider, MD  fish oil-omega-3 fatty acids 1000 MG capsule Take 1 g by mouth daily.   Yes Historical Provider, MD  hyoscyamine (LEVSIN, ANASPAZ) 0.125 MG tablet Take 0.125 mg by mouth 3 (three) times daily.   Yes Historical Provider, MD  metoprolol succinate (TOPROL-XL) 25 MG 24 hr tablet Take 12.5 mg by mouth daily.    Yes Historical Provider, MD  pantoprazole (PROTONIX) 40 MG tablet TAKE 1 TABLET AT BEDTIME 05/16/11  Yes Kristian Covey, MD  pregabalin (LYRICA) 100 MG capsule Take 100 mg by mouth 2 (two) times daily.   Yes Historical Provider, MD  sodium chloride 1 G tablet Take 1 g by mouth daily.    Yes Historical Provider, MD    Inpatient medications:    . acidophilus  1 capsule Oral Daily  . ALPRAZolam  0.125 mg Oral BID  . ALPRAZolam  0.25 mg Oral QHS  . aspirin EC  81 mg Oral Daily  . calcium carbonate  1 tablet Oral Daily  . carbamazepine  100 mg Oral TID  . citalopram  10 mg Oral Daily  . clopidogrel  75 mg Oral Q breakfast  . docusate sodium  100 mg Oral Daily  . enoxaparin (LOVENOX) injection  40 mg Subcutaneous Q24H  . famotidine  10 mg Oral BID  . ferrous sulfate  325 mg Oral Q breakfast  . furosemide  20 mg Oral Daily  . hyoscyamine  0.125 mg Sublingual TID  . lactose free nutrition  237 mL Oral QPC lunch  . metoprolol succinate  12.5 mg Oral Daily  . pantoprazole  40 mg Oral QHS  . pneumococcal 23 valent vaccine  0.5 mL Intramuscular Once    . pregabalin  100 mg Oral BID    Review of Systems Gen:  Denies headache, fever, chills, sweats.  No weight loss. HEENT:  No visual change, sore throat, difficulty swallowing. Resp:  No difficulty  breathing, DOE.  No cough or hemoptysis. Cardiac:  No chest pain, orthopnea, PND.  Denies edema. GI:   Denies abdominal pain.   No nausea, vomiting, diarrhea.  No constipation. GU:  Denies difficulty or change in voiding.  No change in urine color.     MS:  Denies joint pain or swelling.   Derm:  Denies skin rash or itching.  No chronic skin conditions.  Neuro:   Denies focal weakness, memory problems, hx stroke or TIA.   Psych:  Denies symptoms of depression of anxiety.  No hallucination.    Physical Exam:  Blood pressure 113/62, pulse 68, temperature 97.6 F (36.4 C), temperature source Oral, resp. rate 20, height 5\' 2"  (1.575 m), weight 51.5 kg (113 lb 8.6 oz), SpO2 94.00%.  Gen: alert, frail, elderly Skin: no rash, cyanosis Neck: no JVD, no bruits or LAN Chest: clear bilat Heart: regular, no rub or gallop Abdomen: soft, nontender, nondistended Ext: no LE edema Neuro: alert, Ox3, no focal deficit Heme/Lymph: no bruising or LAN  Labs: Basic Metabolic Panel:  Lab 02/29/12 1610 02/28/12 0459 02/27/12 0515 02/26/12 1940  NA 123* 128* 128* 126*  K 4.0 4.1 4.0 4.3  CL 87* 90* 93* 88*  CO2 28 29 28 31   GLUCOSE 102* 107* 99 102*  BUN 11 11 8 8   CREATININE 0.51 0.52 0.44* 0.47*  ALB -- -- -- --  CALCIUM 8.7 8.8 8.3* 8.6  PHOS -- -- -- --   Liver Function Tests:  Lab 02/26/12 1940  AST 19  ALT 16  ALKPHOS 95  BILITOT 0.2*  PROT 5.9*  ALBUMIN 3.3*   No results found for this basename: LIPASE:3,AMYLASE:3 in the last 168 hours No results found for this basename: AMMONIA:3 in the last 168 hours CBC:  Lab 02/27/12 0515 02/26/12 1940  WBC 4.3 5.3  NEUTROABS -- --  HGB 12.9 13.7  HCT 36.5 38.6  MCV 90.3 90.4  PLT 230 266   PT/INR: @labrcntip (inr:5) Cardiac Enzymes: No  results found for this basename: CKTOTAL:5,CKMB:5,CKMBINDEX:5,TROPONINI:5 in the last 168 hours CBG: No results found for this basename: GLUCAP:5 in the last 168 hours  Iron Studies: No results found for this basename: IRON:30,TIBC:30,TRANSFERRIN:30,FERRITIN:30 in the last 168 hours  Xrays/Other Studies: Dg Chest 2 View  02/27/2012  *RADIOLOGY REPORT*  Clinical Data: 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  The lungs are slightly hyperaerated.  Mediastinal contours appear normal.  The heart is mildly enlarged.  Median sternotomy sutures are noted from prior CABG.  The bones are osteopenic.  IMPRESSION: Slight hyperaeration.  No active lung disease.  Original Report Authenticated By: Juline Patch, M.D.   Dg Lumbar Spine Complete  02/28/2012  *RADIOLOGY REPORT*  Clinical Data: 78 year old female status post fall with pain.  LUMBAR SPINE - COMPLETE 4+ VIEW  Comparison: CT abdomen pelvis 06/12/2011 and earlier.  Findings: Chronic L4 compression fracture with mild additional loss of height since 2012.  New L3 inferior endplate compression deformity with mild sclerosis.  The L5 and remaining lumbar levels appear stable.  Chronic lumbar disc space loss, advanced at L4-L5. Dextroconvex scoliosis appears increased. Stable left hemi pelvis phlebolith.  IMPRESSION: 1.  L3 inferior endplate compression fracture is new since 06/12/2011.  If specific therapy such as vertebroplasty is desired, nuclear medicine bone scan or lumbar MRI would best determine acuity. 2.  Chronic L4 compression fracture  with mild additional loss of height since 2012.  Per CMS PQRS reporting requirements (PQRS Measure 24): Given the patient's age of greater than 50 and the fracture site (hip, distal radius, or spine), the patient should be tested for osteoporosis using DXA, and the appropriate treatment considered  based on the DXA results.  Original Report Authenticated By: Harley Hallmark, M.D.    Assessment/Recommendations 1. Hyponatremia- this is consistent with SIADH, idiopathic +/- medication related. SSRI's are known to cause SIADH especially in elderly patients, and she was started on an SSRI in March.  This should be stopped.  Family would like her to have another antidepressant, defer to primary MD.  She should be considered SIADH and would recommend continuing with fluid restriction here and at home- that is very important. Also would recommend resuming salt tablets- range of dosing would be 1 gm bid to 3 gm tid maximum.  Will start today with 2 gm tid, but she probably won't require this much as a maintenance dose. Would not recommend demeclocycline due to cost and potential side effects (see UTD also regarding this). Would stop lasix also. Thanks for the referral , will follow.   Vinson Moselle  MD [facility] [phone] pgr    [phone] cell 02/29/2012, 12:53 PM

## 2012-02-29 NOTE — Progress Notes (Signed)
02-29-12  NSG: Please discuss c pt her Fluid Restriction.  And clarify in orders is it is suppose to be 1000cc or1200cc/day.  While teaching pt fluid restriction she mentions that she is concerned it will be too restrictive b/c d/t her dysphagia ST has told her she must drink 1 cup of hot water prior to each meal.  Plus she has been told by her MD that she has to drink a cup of milk each day.  So that is 4 cups per day already.

## 2012-03-01 DIAGNOSIS — M545 Low back pain, unspecified: Secondary | ICD-10-CM

## 2012-03-01 DIAGNOSIS — E871 Hypo-osmolality and hyponatremia: Secondary | ICD-10-CM

## 2012-03-01 DIAGNOSIS — R350 Frequency of micturition: Secondary | ICD-10-CM

## 2012-03-01 LAB — BASIC METABOLIC PANEL
CO2: 30 mEq/L (ref 19–32)
Calcium: 8.5 mg/dL (ref 8.4–10.5)
GFR calc Af Amer: >90 mL/min (ref >90)
Sodium: 128 mEq/L — ABNORMAL LOW (ref 135–145)

## 2012-03-01 NOTE — Progress Notes (Addendum)
Per MD Pt not medically ready for D/C at this time.  CSW contacted [facility] informing of Pt status concerning D/C.   Weekday CSW to F/U concerning D/C.  Leron Croak, LCSWA [employer] [phone]

## 2012-03-01 NOTE — Progress Notes (Signed)
Subjective:   Chronic low back pain and left thigh pain ongoing for greater than 6 months. Ambulated today with assistance and walker. Overall continues to feel better since admission.  Objective  Vital signs in last 24 hours: Filed Vitals:   03/01/12 0634 03/01/12 0635 03/01/12 0637 03/01/12 1049  BP: 143/74 107/65 92/58 119/68  Pulse: 75 75 75 72  Temp: 98.1 F (36.7 C)     TempSrc: Oral     Resp: 19     Height:      Weight: 50.077 kg (110 lb 6.4 oz)     SpO2: 95%      Weight change: -1.423 kg (-3 lb 2.2 oz)  Intake/Output Summary (Last 24 hours) at 03/01/12 1302 Last data filed at 03/01/12 1047  Gross per 24 hour  Intake    700 ml  Output   1725 ml  Net  -1025 ml    Physical Exam:  General Exam: Comfortable.  Respiratory System: Clear. No increased work of breathing.  Cardiovascular System: First and second heart sounds heard. Regular rate and rhythm. No JVD/murmurs/pedal edema. Telemetry DC'd on 5/18. Gastrointestinal System: Abdomen is non distended, soft and normal bowel sounds heard. Nontender. Central Nervous System: Alert and oriented. No focal neurological deficits. Extremities: Symmetric 5 x 5 power.  Labs:  Basic Metabolic Panel:  Lab 03/01/12 6578 02/29/12 0520 02/28/12 0459  NA 128* 123* 128*  K 4.3 4.0 4.1  CL 91* 87* 90*  CO2 30 28 29   GLUCOSE 103* 102* 107*  BUN 14 11 11   CREATININE 0.58 0.51 0.52  CALCIUM 8.5 8.7 8.8  ALB -- -- --  PHOS -- -- --   Liver Function Tests:  Lab 02/26/12 1940  AST 19  ALT 16  ALKPHOS 95  BILITOT 0.2*  PROT 5.9*  ALBUMIN 3.3*   No results found for this basename: LIPASE:3,AMYLASE:3 in the last 168 hours No results found for this basename: AMMONIA:3 in the last 168 hours CBC:  Lab 02/27/12 0515 02/26/12 1940  WBC 4.3 5.3  NEUTROABS -- --  HGB 12.9 13.7  HCT 36.5 38.6  MCV 90.3 90.4  PLT 230 266   Cardiac Enzymes: No results found for this basename: CKTOTAL:5,CKMB:5,CKMBINDEX:5,TROPONINI:5 in the  last 168 hours CBG: No results found for this basename: GLUCAP:5 in the last 168 hours  Iron Studies: No results found for this basename: IRON,TIBC,TRANSFERRIN,FERRITIN in the last 72 hours Studies/Results: No results found. Medications:      . acidophilus  1 capsule Oral Daily  . ALPRAZolam  0.125 mg Oral BID  . ALPRAZolam  0.25 mg Oral QHS  . aspirin EC  81 mg Oral Daily  . calcium carbonate  1 tablet Oral Daily  . carbamazepine  100 mg Oral TID  . clopidogrel  75 mg Oral Q breakfast  . docusate sodium  100 mg Oral Daily  . enoxaparin (LOVENOX) injection  40 mg Subcutaneous Q24H  . famotidine  10 mg Oral BID  . ferrous sulfate  325 mg Oral Q breakfast  . hyoscyamine  0.125 mg Sublingual TID  . lactose free nutrition  237 mL Oral QPC lunch  . metoprolol succinate  12.5 mg Oral Daily  . pantoprazole  40 mg Oral QHS  . pneumococcal 23 valent vaccine  0.5 mL Intramuscular Once  . pregabalin  100 mg Oral BID  . sodium chloride  2 g Oral TID WC  . DISCONTD: citalopram  10 mg Oral Daily  . DISCONTD: furosemide  20  mg Oral Daily    I  have reviewed scheduled and prn medications.     Problem/Plan: Principal Problem:  *Hyponatremia Active Problems:  IHD (ischemic heart disease)  Dyslipidemia  IDA (iron deficiency anemia)  Anxiety  Hypertension  Hx of CABG  Normocytic anemia  Postherpetic neuralgia  1. Hyponatremia, possibly subacute on chronic: Nephrology consultation appreciated. Consistent with SIADH, idiopathic +/- medication related. Per her daughter, patient's Celexa dose was recently increased apart 3-4 weeks ago which may have contributed. Discontinued Celexa and Lasix. Started on salt tablets. Sodium has improved from 123-128. Followup BMP tomorrow. 2. Low back pain/lumbar compression fractures with possible radiculopathy: Patient has had chronic pain since late last year which is gradually worsening. Recommend outpatient referral by her primary care physician to  orthopedic/spine M.D. for further evaluation and management. Lumbar spine x-ray showed L4 and L3 compression fractures, both probably chronic. According to daughter, L4 fracture was seen November of 2012 and L3 fracture was in February of 2013. Pain better controlled. 3. Urinary frequency/discomfort: Urinalysis does not suggest of infection. Patient has post void residual of 217 ML. Discussed with Urology on 5/17 who advise starting no new medication but will arrange for early followup in their office for urodynamic studies and further management. 4. Ramsey Hunt  Syndrome: Continue Tegretol. 5. Coronary artery disease, status post CABG: Asymptomatic of chest pain. 6. Altered mental status/? Visual [place]: Unclear etiology but resolved. 7. Failure to thrive.  Updated patient and patient's daughter at bedside regarding ongoing care. If sodium remains stable, possible discharge to skilled nursing facility on 5/20.   Pearla Mckinny 03/01/2012,1:02 PM  LOS: 4 days

## 2012-03-01 NOTE — Progress Notes (Signed)
Subjective: Alert, up in chair eating. No complaints.  Objective Vital signs in last 24 hours: Filed Vitals:   03/01/12 0635 03/01/12 0637 03/01/12 1049 03/01/12 1344  BP: 107/65 92/58 119/68 126/71  Pulse: 75 75 72 78  Temp:    98 F (36.7 C)  TempSrc:    Oral  Resp:    20  Height:      Weight:      SpO2:    95%   Weight change: -1.423 kg (-3 lb 2.2 oz)  Intake/Output Summary (Last 24 hours) at 03/01/12 1903 Last data filed at 03/01/12 1742  Gross per 24 hour  Intake    460 ml  Output   1625 ml  Net  -1165 ml   Labs: Basic Metabolic Panel:  Lab 03/01/12 1610 02/29/12 0520 02/28/12 0459 02/27/12 0515 02/26/12 1940  NA 128* 123* 128* 128* 126*  K 4.3 4.0 4.1 4.0 4.3  CL 91* 87* 90* 93* 88*  CO2 30 28 29 28 31   GLUCOSE 103* 102* 107* 99 102*  BUN 14 11 11 8 8   CREATININE 0.58 0.51 0.52 0.44* 0.47*  ALB -- -- -- -- --  CALCIUM 8.5 8.7 8.8 8.3* 8.6  PHOS -- -- -- -- --   Liver Function Tests:  Lab 02/26/12 1940  AST 19  ALT 16  ALKPHOS 95  BILITOT 0.2*  PROT 5.9*  ALBUMIN 3.3*   No results found for this basename: LIPASE:3,AMYLASE:3 in the last 168 hours No results found for this basename: AMMONIA:3 in the last 168 hours CBC:  Lab 02/27/12 0515 02/26/12 1940  WBC 4.3 5.3  NEUTROABS -- --  HGB 12.9 13.7  HCT 36.5 38.6  MCV 90.3 90.4  PLT 230 266   PT/INR: @labrcntip (inr:5) Cardiac Enzymes: No results found for this basename: CKTOTAL:5,CKMB:5,CKMBINDEX:5,TROPONINI:5 in the last 168 hours CBG: No results found for this basename: GLUCAP:5 in the last 168 hours  Iron Studies: No results found for this basename: IRON:30,TIBC:30,TRANSFERRIN:30,FERRITIN:30 in the last 168 hours Studies/Results: No results found. Medications:      . acidophilus  1 capsule Oral Daily  . ALPRAZolam  0.125 mg Oral BID  . ALPRAZolam  0.25 mg Oral QHS  . aspirin EC  81 mg Oral Daily  . calcium carbonate  1 tablet Oral Daily  . carbamazepine  100 mg Oral TID  .  clopidogrel  75 mg Oral Q breakfast  . docusate sodium  100 mg Oral Daily  . enoxaparin (LOVENOX) injection  40 mg Subcutaneous Q24H  . famotidine  10 mg Oral BID  . ferrous sulfate  325 mg Oral Q breakfast  . hyoscyamine  0.125 mg Sublingual TID  . lactose free nutrition  237 mL Oral QPC lunch  . metoprolol succinate  12.5 mg Oral Daily  . pantoprazole  40 mg Oral QHS  . pneumococcal 23 valent vaccine  0.5 mL Intramuscular Once  . pregabalin  100 mg Oral BID  . sodium chloride  2 g Oral TID WC    I  have reviewed scheduled and prn medications.  Physical Exam:  Blood pressure 126/71, pulse 78, temperature 98 F (36.7 C), temperature source Oral, resp. rate 20, height 5\' 2"  (1.575 m), weight 50.077 kg (110 lb 6.4 oz), SpO2 95.00%.  Gen: alert, frail, elderly  Skin: no rash, cyanosis  Neck: no JVD, no bruits or LAN  Chest: clear bilat  Heart: regular, no rub or gallop  Abdomen: soft, nontender, nondistended  Ext: no LE edema  Neuro: alert, Ox3, no focal deficit  Heme/Lymph: no bruising or LAN    Assessment/Recommendations  1. Hyponatremia, euvolemic with normal thyroid, renal and adrenal function- this is consistent with SIADH, idiopathic and/or due to medication side effects (SSRI).  SSRI stopped, lasix stopped and we resumed salt tablets, which is recommended therapy for chronic hyponatremia due to SIADH. Na+ up 123 > 128 today. If improving further tomorrow, will decrease the dose.     Vinson Moselle  MD [facility] [phone] pgr    [phone] cell 03/01/2012, 7:03 PM

## 2012-03-02 DIAGNOSIS — R350 Frequency of micturition: Secondary | ICD-10-CM

## 2012-03-02 DIAGNOSIS — F329 Major depressive disorder, single episode, unspecified: Secondary | ICD-10-CM | POA: Diagnosis present

## 2012-03-02 DIAGNOSIS — M79605 Pain in left leg: Secondary | ICD-10-CM | POA: Diagnosis present

## 2012-03-02 DIAGNOSIS — M545 Low back pain: Secondary | ICD-10-CM

## 2012-03-02 DIAGNOSIS — E871 Hypo-osmolality and hyponatremia: Secondary | ICD-10-CM

## 2012-03-02 DIAGNOSIS — S32000A Wedge compression fracture of unspecified lumbar vertebra, initial encounter for closed fracture: Secondary | ICD-10-CM | POA: Diagnosis present

## 2012-03-02 LAB — BASIC METABOLIC PANEL
GFR calc Af Amer: >90 mL/min (ref >90)
GFR calc non Af Amer: 87 mL/min — ABNORMAL LOW (ref >90)
Glucose, Bld: 100 mg/dL — ABNORMAL HIGH (ref 70–99)
Potassium: 4.5 mEq/L (ref 3.5–5.1)
Sodium: 128 mEq/L — ABNORMAL LOW (ref 135–145)

## 2012-03-02 MED ORDER — ALPRAZOLAM 0.25 MG PO TABS: 0.1250 mg | ORAL_TABLET | ORAL | Status: DC

## 2012-03-02 MED ORDER — ACETAMINOPHEN 500 MG PO TABS: 500.0000 mg | ORAL_TABLET | Freq: Four times a day (QID) | ORAL | Status: AC | PRN

## 2012-03-02 MED ORDER — TRAMADOL HCL 50 MG PO TABS: 50.0000 mg | ORAL_TABLET | Freq: Four times a day (QID) | ORAL | Status: DC | PRN

## 2012-03-02 MED ORDER — SODIUM CHLORIDE 1 G PO TABS
2.0000 g | ORAL_TABLET | Freq: Two times a day (BID) | ORAL | Status: DC
  Filled 2012-03-02 (×2): qty 2

## 2012-03-02 MED ORDER — BUPROPION HCL ER (SR) 100 MG PO TB12
100.0000 mg | ORAL_TABLET | Freq: Every day | ORAL | Status: DC
  Administered 2012-03-02: 100 mg via ORAL
  Filled 2012-03-02: qty 1

## 2012-03-02 MED ORDER — SODIUM CHLORIDE 1 G PO TABS: 2.0000 g | ORAL_TABLET | Freq: Two times a day (BID) | ORAL | Status: DC

## 2012-03-02 MED ORDER — ALPRAZOLAM 0.25 MG PO TABS
0.1250 mg | ORAL_TABLET | Freq: Once | ORAL | Status: AC
  Administered 2012-03-02: 0.125 mg via ORAL
  Filled 2012-03-02: qty 1

## 2012-03-02 MED ORDER — BOOST PLUS PO LIQD: 237.0000 mL | Freq: Every day | ORAL | Status: AC

## 2012-03-02 NOTE — Progress Notes (Signed)
Patient is set to discharge to [facility] today. Patient & daughter at bedside aware. [facility] called for transport pickup @ 2:45p.   Clinical Social Work Department CLINICAL SOCIAL WORK PLACEMENT NOTE 03/02/2012  Patient:  Sabrina Moore, Sabrina Moore  Account Number:  [id number] Admit date:  02/26/2012  Clinical Social Worker:  Orpah Greek  Date/time:  02/28/2012 02:21 PM  Clinical Social Work is seeking post-discharge placement for this patient at the following level of care:   SKILLED NURSING   (*CSW will update this form in Epic as items are completed)   02/27/2012  Patient/family provided with [facility] Department of Clinical Social Work's list of facilities offering this level of care within the geographic area requested by the patient (or if unable, by the patient's family).  02/27/2012  Patient/family informed of their freedom to choose among providers that offer the needed level of care, that participate in Medicare, Medicaid or managed care program needed by the patient, have an available bed and are willing to accept the patient.  02/27/2012  Patient/family informed of [facility]' ownership interest in [facility], as well as of the fact that they are under no obligation to receive care at this facility.  PASARR submitted to EDS on 02/27/2012 PASARR number received from EDS on 02/28/2012  FL2 transmitted to all facilities in geographic area requested by pt/family on  02/27/2012 FL2 transmitted to all facilities within larger geographic area on   Patient informed that his/her managed care company has contracts with or will negotiate with  certain facilities, including the following:     Patient/family informed of bed offers received:  02/28/2012 Patient chooses bed at [facility] Physician recommends and patient chooses bed at    [facility]  03/02/2012 Patient to be transferred to facility by [facility]  The following  physician request were entered in Epic:   Additional Comments:    Unice Bailey, LCSWA [phone]

## 2012-03-02 NOTE — Progress Notes (Signed)
P/u by transport personel to [facility], daughter at bedside. Patient is calm and quiet at this time. PIV removed no s/s of infiltration or swelling noted.Report given toSandra Rn.

## 2012-03-02 NOTE — Progress Notes (Signed)
Occupational Therapy Treatment Patient Details Name: Sabrina Moore MRN: [id number] DOB: 03/03/1927 Today's Date: 03/02/2012 Time: 0950-1020 OT Time Calculation (min): 30 min  OT Assessment / Plan / Recommendation Comments on Treatment Session pt very focused on level of fatigue    Follow Up Recommendations  Skilled nursing facility       Equipment Recommendations  Defer to next venue          Plan Discharge plan remains appropriate    Precautions / Restrictions Precautions Precautions: Fall Precaution Comments: Pt complained of extreme fatigue Restrictions Weight Bearing Restrictions: No       ADL  Lower Body Dressing: Performed;Minimal assistance Where Assessed - Lower Body Dressing: Unsupported sit to stand Toilet Transfer: Performed;Min guard Toilet Transfer Method: Sit to stand;Other (comment) (ambulating to bathroom) Toilet Transfer Equipment: Comfort height toilet;Grab bars Toileting - Clothing Manipulation and Hygiene: Performed;Min guard Where Assessed - Toileting Clothing Manipulation and Hygiene: Standing Transfers/Ambulation Related to ADLs: Pt fatigued quickly and was frustrated with urine incontinence.  Pt has depends on and a pad and needed to change both once got to bathroom.  Discussed going to bathroom every 2 hours to help with this problem, as well as  putting bedside commode beside bed      OT Goals ADL Goals ADL Goal: Lower Body Bathing - Progress: Progressing toward goals ADL Goal: Lower Body Dressing - Progress: Progressing toward goals ADL Goal: Toilet Transfer - Progress: Progressing toward goals ADL Goal: Toileting - Clothing Manipulation - Progress: Progressing toward goals           Cognition  Overall Cognitive Status: Appears within functional limits for tasks assessed/performed Arousal/Alertness: Awake/alert Orientation Level: Appears intact for tasks assessed Behavior During Session: Agitated Cognition - Other Comments: Pt agitated  by level of fatigue, as well as wetting depends and having to change depends and underwear    Mobility Transfers Transfers: Sit to Stand;Stand to Sit Sit to Stand: 4: Min guard;From toilet;From chair/3-in-1;With upper extremity assist Stand to Sit: 4: Min guard;To toilet;To chair/3-in-1;With upper extremity assist Details for Transfer Assistance: verbal cues for hand placement         End of Session OT - End of Session Activity Tolerance: Patient limited by fatigue Patient left: in bed;with family/visitor present   Shanera Meske, [place] Kung 03/02/2012, 11:06 AM

## 2012-03-02 NOTE — Progress Notes (Signed)
Noted plans for discharge. For hyponatremia would recommend continuing salt tablets at 2gm bid for now.  Dose can be adjusted up of down (usual max is 3 gm tid) to keep sodium levels above 125.  Would follow labs frequently twice a week for a couple weeks, then weekly, etc...  Will sign off, call as needed.  Vinson Moselle  MD [employer] [phone] pgr    [phone] cell 03/02/2012, 3:45 PM

## 2012-03-02 NOTE — Discharge Summary (Addendum)
Discharge Summary  Sabrina Moore MR#: [id number]  DOB:Mar 22, 1927  Date of Admission: 02/26/2012 Date of Discharge: 03/02/2012  Patient's PCP: Elby Showers, MD, MD  Attending Physician:Lisha Vitale  Consults: Nephrology: Maree Krabbe, MD   Discharge Diagnoses: Principal Problem:  *Hyponatremia Active Problems:  IHD (ischemic heart disease)  Dyslipidemia  IDA (iron deficiency anemia)  Anxiety  Hypertension  Hx of CABG  Normocytic anemia  Postherpetic neuralgia  Lumbar compression fracture  Low back pain radiating to left leg  Depression   Brief Admitting History and Physical 78 year old female patient with history of hypertension, coronary artery disease status post CABG, hyperlipidemia, chronic low back pain and left thigh pain secondary to prior L3 and L4 compression fractures, right Ramsey Hunt Syndrome, anxiety and depression was sent to the hospital by the primary care physician for evaluation and management of hypo-natremia. She also had multiple other complaints including nausea and choking sensation of recent onset, urinary frequency and difficulty urinating despite recently being treated for urinary tract infection, gradually worsening low back pain and left thigh pain, altered mental status and falls. The hospitalist service was requested to admit for further evaluation and management.  Discharge Medications Current Discharge Medication List    START taking these medications   Details  lactose free nutrition (BOOST PLUS) LIQD Take 237 mLs by mouth daily after lunch daily after lunch.      CONTINUE these medications which have CHANGED   Details  acetaminophen (TYLENOL) 500 MG tablet Take 1 tablet (500 mg total) by mouth every 6 (six) hours as needed. Mild pain.    ALPRAZolam (XANAX) 0.25 MG tablet Take 0.5-1 tablets (0.125-0.25 mg total) by mouth See admin instructions. Takes 1/2 tablet of 0.25mg  bid and takes 1 tablet of 0.25mg  at bedtime Qty: 10 tablet,  Refills: 0    sodium chloride 1 G tablet Take 2 tablets (2 g total) by mouth 2 (two) times daily.    traMADol (ULTRAM) 50 MG tablet Take 1 tablet (50 mg total) by mouth every 6 (six) hours as needed. Moderate pain      CONTINUE these medications which have NOT CHANGED   Details  acidophilus (RISAQUAD) CAPS Take 1 capsule by mouth daily.    aspirin 81 MG tablet Take 81 mg by mouth daily.      bismuth subsalicylate (PEPTO BISMOL) 262 MG chewable tablet Chew 524 mg by mouth as needed.    buPROPion (WELLBUTRIN SR) 100 MG 12 hr tablet Take 100 mg by mouth daily.    calcium carbonate (OS-CAL) 600 MG TABS Take 600 mg by mouth daily.    carbamazepine (TEGRETOL) 100 MG chewable tablet Chew 100 mg by mouth 3 (three) times daily. 100 mg tid non chewable    clopidogrel (PLAVIX) 75 MG tablet Take 1 tablet (75 mg total) by mouth daily. Qty: 90 tablet, Refills: 3    diphenhydrAMINE (BENADRYL) 12.5 MG/5ML elixir Take 12.5 mg by mouth 4 (four) times daily as needed. itching    docusate sodium (COLACE) 100 MG capsule Take 100 mg by mouth daily.     famotidine (PEPCID) 10 MG tablet Take 10 mg by mouth 2 (two) times daily.      ferrous sulfate 325 (65 FE) MG tablet Take 325 mg by mouth daily.    fish oil-omega-3 fatty acids 1000 MG capsule Take 1 g by mouth daily.    hyoscyamine (LEVSIN, ANASPAZ) 0.125 MG tablet Take 0.125 mg by mouth 3 (three) times daily.    metoprolol succinate (TOPROL-XL) 25 MG  24 hr tablet Take 12.5 mg by mouth daily.     mometasone (NASONEX) 50 MCG/ACT nasal spray Place 2 sprays into the nose at bedtime.    nitroGLYCERIN (NITROSTAT) 0.4 MG SL tablet Place 0.4 mg under the tongue every 5 (five) minutes as needed. Chest pain    ondansetron (ZOFRAN) 4 MG tablet Take 4 mg by mouth every 8 (eight) hours as needed. nausea    pantoprazole (PROTONIX) 40 MG tablet TAKE 1 TABLET AT BEDTIME Qty: 30 tablet, Refills: 11    polyethylene glycol (MIRALAX / GLYCOLAX) packet Take 17 g  by mouth daily as needed. constipation    pregabalin (LYRICA) 100 MG capsule Take 100 mg by mouth 2 (two) times daily.        Hospital Course: Hyponatremia Present on Admission:  .IHD (ischemic heart disease) .Dyslipidemia .Anxiety .Hypertension .Normocytic anemia .Postherpetic neuralgia .IDA (iron deficiency anemia) .Lumbar compression fracture .Low back pain radiating to left leg .Depression   1. Hyponatremia: Consistent with SIADH, idiopathic +/- medication related. On reviewing her records patient has had intermittent issues with low sodium over the last couple of years. Patient had been on Celexa at home, the dose of which was recently increased from 10 mg daily to 20 mg daily approximately 3-4 weeks ago. Nephrology was consulted. Patient was clinically euvolemic with normal TSH, renal panel and cortisol. Chest x-ray did not show anything acute. Nephrology discontinued Celexa which was thought to be a one of the culprit agents. She was started on salt tablets and recommend discharging on 2 g twice a day. They recommend repeating BMP in 4 days from hospital discharge and then a week later. If the sodium is still low then nephrology recommends increasing sodium tablets to 2 g three times a day.  2. Low back pain/lumbar compression fractures with possible radiculopathy: Patient has had chronic pain since late last year which is gradually worsening. Recommend outpatient referral by her primary care physician to orthopedic/spine M.D. for further evaluation and management. Lumbar spine x-ray showed L4 and L3 compression fractures, both probably chronic. According to daughter, L4 fracture was seen November of 2012 and L3 fracture was in February of 2013. Pain better controlled. 3. Urinary frequency/discomfort: Urinalysis does not suggest of infection. Patient had post void residual of 217 ML. Discussed with Urology on 5/17 who advise starting no new medication but will arrange for early followup  in their office for urodynamic studies and further management. Patient's daughter has been called with a new followup appointment. 4. Ramsey Hunt Syndrome: Continue Tegretol and Lyrica. This was not an active issue in the hospital.  5. Coronary artery disease, status post CABG: Asymptomatic of chest pain. continue aspirin, Plavix and metoprolol.  6. Altered mental status/? Visual  Hallucinations : Unclear etiology but resolved. 7. Failure to thrive. Speech therapy consulted and recommended regular consistency diet and thin liquids. 8. Depression and anxiety: As indicated above Celexa was discontinued. Discussed with her psychiatrist/curbside and started on Wellbutrin SR.  9. Hypertension: Reasonably controlled. 10. History of falls: To skilled nursing facility for rehabilitation.  Day of Discharge  Complaints: Says feeling anxious today. No confusion. No other new complaints.  Physical exam: BP 148/74  Pulse 74  Temp(Src) 97.7 F (36.5 C) (Oral)  Resp 19  Ht 5\' 2"  (1.575 m)  Wt 50.621 kg (111 lb 9.6 oz)  BMI 20.41 kg/m2  SpO2 97% General Exam: Comfortable. Slightly anxious appearing. Respiratory System: Clear. No increased work of breathing.  Cardiovascular System: First and second  heart sounds heard. Regular rate and rhythm. No JVD/murmurs/pedal edema. Telemetry DC'd on 5/18.  Gastrointestinal System: Abdomen is non distended, soft and normal bowel sounds heard. Nontender.  Central Nervous System: Alert and oriented. No focal neurological deficits.  Extremities: Symmetric 5 x 5 power.  Basic Metabolic Panel:  Lab 03/02/12 4098 03/01/12 0529 02/29/12 0520  NA 128* 128* 123*  K 4.5 4.3 4.0  CL 92* 91* 87*  CO2 30 30 28   GLUCOSE 100* 103* 102*  BUN 11 14 11   CREATININE 0.50 0.58 0.51  CALCIUM 8.9 8.5 8.7  ALB -- -- --  PHOS -- -- --   Liver Function Tests:  Lab 02/26/12 1940  AST 19  ALT 16  ALKPHOS 95  BILITOT 0.2*  PROT 5.9*  ALBUMIN 3.3*   No results found for  this basename: LIPASE:3,AMYLASE:3 in the last 168 hours No results found for this basename: AMMONIA:3 in the last 168 hours CBC:  Lab 02/27/12 0515 02/26/12 1940  WBC 4.3 5.3  NEUTROABS -- --  HGB 12.9 13.7  HCT 36.5 38.6  MCV 90.3 90.4  PLT 230 266   Cardiac Enzymes: No results found for this basename: CKTOTAL:5,CKMB:5,CKMBINDEX:5,TROPONINI:5 in the last 168 hours CBG: No results found for this basename: GLUCAP:5 in the last 168 hours  other lab data:  1. Serum osmolarity 257. 2. INR 0.98. 3. Random cortisol 14.7. 4. TSH: 1.157. 5. Urine analysis was not suggestive of urinary tract infection. 6. Urine osmolarity 317. 7. Urine sodium 42, creatinine 65.   Dg Chest 2 View  02/27/2012  *RADIOLOGY REPORT*  Clinical Data: 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  The lungs are slightly hyperaerated.  Mediastinal contours appear normal.  The heart is mildly enlarged.  Median sternotomy sutures are noted from prior CABG.  The bones are osteopenic.  IMPRESSION: Slight hyperaeration.  No active lung disease.  Original Report Authenticated By: Juline Patch, M.D.   Dg Lumbar Spine Complete  02/28/2012  *RADIOLOGY REPORT*  Clinical Data: 78 year old female status post fall with pain.  LUMBAR SPINE - COMPLETE 4+ VIEW  Comparison: CT abdomen pelvis 06/12/2011 and earlier.  Findings: Chronic L4 compression fracture with mild additional loss of height since 2012.  New L3 inferior endplate compression deformity with mild sclerosis.  The L5 and remaining lumbar levels appear stable.  Chronic lumbar disc space loss, advanced at L4-L5. Dextroconvex scoliosis appears increased. Stable left hemi pelvis phlebolith.  IMPRESSION: 1.  L3 inferior endplate compression fracture is new since 06/12/2011.  If specific therapy such as vertebroplasty is desired, nuclear medicine bone scan  or lumbar MRI would best determine acuity. 2.  Chronic L4 compression fracture with mild additional loss of height since 2012.  Per CMS PQRS reporting requirements (PQRS Measure 24): Given the patient's age of greater than 50 and the fracture site (hip, distal radius, or spine), the patient should be tested for osteoporosis using DXA, and the appropriate treatment considered based on the DXA results.  Original Report Authenticated By: Harley Hallmark, M.D.   2-D echocardiogram:  Study Conclusions  - Left ventricle: The cavity size was normal. Wall thickness was increased in a pattern of mild LVH. Systolic function was normal. The estimated ejection fraction was in the range of 60% to 65%. Doppler parameters are consistent with abnormal left ventricular relaxation (grade 1 diastolic dysfunction). - Mitral valve: Mild regurgitation  Disposition:  discharge to skilled nursing facility in stable condition.  Diet: Heart healthy.   Activity:  increase activity gradually.    Follow-up Appts: Discharge Orders    Future Appointments: Provider: Department: Dept Phone: Center:   04/24/2012 2:00 PM Cassell Clement, MD [facility] [phone] None     Future Orders Please Complete By Expires   Diet - low sodium heart healthy      Increase activity slowly      Call MD for:  severe uncontrolled pain         TESTS THAT NEED FOLLOW-UP  BMP in 4 days from hospital discharge and then a week later.   Time spent on discharge, talking to the patient, and coordinating care: 45  mins.   SignedMarcellus Scott, MD 03/02/2012, 1:16 PM

## 2012-03-30 ENCOUNTER — Other Ambulatory Visit: Payer: Self-pay | Admitting: Orthopedic Surgery

## 2012-03-30 DIAGNOSIS — M545 Low back pain: Secondary | ICD-10-CM

## 2012-03-31 ENCOUNTER — Other Ambulatory Visit: Payer: Self-pay | Admitting: Cardiology

## 2012-03-31 ENCOUNTER — Telehealth: Payer: Self-pay | Admitting: Cardiology

## 2012-03-31 NOTE — Telephone Encounter (Signed)
New problem:  Patient daughter calling does patient suppose to take gentric of plavix-  colpidogrel 75 mg.

## 2012-03-31 NOTE — Telephone Encounter (Signed)
Patient's daughter called stated she picked up mothers plavix at drugstore and was given generic plavix.Advised ok to take generic plavix.

## 2012-04-07 ENCOUNTER — Other Ambulatory Visit: Payer: Medicare Other

## 2012-04-08 ENCOUNTER — Ambulatory Visit
Admission: RE | Admit: 2012-04-08 | Discharge: 2012-04-08 | Disposition: A | Discharge status: Home / Self Care | Payer: Medicare Other | Source: Ambulatory Visit | Attending: Orthopedic Surgery | Admitting: Orthopedic Surgery

## 2012-04-08 DIAGNOSIS — M545 Low back pain: Secondary | ICD-10-CM

## 2012-04-10 ENCOUNTER — Ambulatory Visit: Payer: Medicare Other | Admitting: Cardiology

## 2012-04-24 ENCOUNTER — Ambulatory Visit: Payer: Medicare Other | Admitting: Cardiology

## 2012-05-04 ENCOUNTER — Ambulatory Visit: Payer: Medicare Other | Admitting: Cardiology

## 2012-05-15 ENCOUNTER — Encounter: Payer: Self-pay | Admitting: Cardiology

## 2012-05-15 ENCOUNTER — Ambulatory Visit (INDEPENDENT_AMBULATORY_CARE_PROVIDER_SITE_OTHER): Payer: Medicare Other | Admitting: Cardiology

## 2012-05-15 VITALS — BP 136/62 | HR 68 | Ht 62.0 in | Wt 107.0 lb

## 2012-05-15 DIAGNOSIS — Z951 Presence of aortocoronary bypass graft: Secondary | ICD-10-CM

## 2012-05-15 DIAGNOSIS — I2510 Atherosclerotic heart disease of native coronary artery without angina pectoris: Secondary | ICD-10-CM

## 2012-05-15 DIAGNOSIS — E7800 Pure hypercholesterolemia, unspecified: Secondary | ICD-10-CM

## 2012-05-15 DIAGNOSIS — E871 Hypo-osmolality and hyponatremia: Secondary | ICD-10-CM

## 2012-05-15 DIAGNOSIS — B0229 Other postherpetic nervous system involvement: Secondary | ICD-10-CM

## 2012-05-15 LAB — BASIC METABOLIC PANEL
BUN: 14 mg/dL (ref 6–23)
Chloride: 88 mEq/L — ABNORMAL LOW (ref 96–112)
Potassium: 4.5 mEq/L (ref 3.5–5.1)
Sodium: 129 mEq/L — ABNORMAL LOW (ref 135–145)

## 2012-05-15 NOTE — Patient Instructions (Addendum)
Will obtain labs today and call you with the results  Your physician recommends that you continue on your current medications as directed. Please refer to the Current Medication list given to you today.  Your physician wants you to follow-up in: 6 month You will receive a reminder letter in the mail two months in advance. If you don't receive a letter, please call our office to schedule the follow-up appointment.  

## 2012-05-15 NOTE — Progress Notes (Signed)
Quick Note:  Please report to patient. The recent labs are stable. Continue same medication and careful diet. Serum sodium still low. Continue fluid restriction and high salt diet. Copy to Dr. Clent Ridges. ______

## 2012-05-15 NOTE — Progress Notes (Signed)
Juan Quam Date of Birth:  December 22, 1926 [facility] [address] [place], [place]  [address] [phone]  Fax   [phone]  HPI: This pleasant 78 year old woman is seen for a scheduled followup office visit.  She has a past history of ischemic heart disease with prior coronary artery bypass graft surgery in 1996.  In April 2011 she had an abnormal Cardiolite stress test followed by subsequent successful PCI with stenting of the saphenous vein graft to the diagonal and successful stenting of the saphenous vein graft to the intermediate.  He has had no recurrent angina.  She has had a lot of problems with postherpetic trigeminal neuralgia.  She has also had problems with persistent hyponatremia.  Dr. Clent Ridges has been following her closely.  She is now on a 6 g sodium diet and a 1500 cc per day fluid restriction.  We are checking a basal metabolic panel today Current Outpatient Prescriptions  Medication Sig Dispense Refill  . acetaminophen (TYLENOL) 500 MG tablet Take 1 tablet (500 mg total) by mouth every 6 (six) hours as needed. Mild pain.      Marland Kitchen acidophilus (RISAQUAD) CAPS Take 1 capsule by mouth daily.      Marland Kitchen aspirin 81 MG tablet Take 81 mg by mouth daily.        Marland Kitchen bismuth subsalicylate (PEPTO BISMOL) 262 MG chewable tablet Chew 524 mg by mouth as needed.      . calcium carbonate (OS-CAL) 600 MG TABS Take 1,000 mg by mouth daily.       . carbamazepine (TEGRETOL) 100 MG chewable tablet Chew 100 mg by mouth as needed. 100 mg tid non chewable      . carisoprodol (SOMA) 250 MG tablet Take 350 mg by mouth as needed.      . Cholecalciferol (VITAMIN D PO) Take 400 Units by mouth 2 (two) times daily.      . diphenhydrAMINE (BENADRYL) 12.5 MG/5ML elixir Take 12.5 mg by mouth 4 (four) times daily as needed. itching      . docusate sodium (COLACE) 100 MG capsule Take 100 mg by mouth 3 (three) times daily as needed.       . famotidine (PEPCID) 10 MG tablet Take 10 mg by mouth 2  (two) times daily.        . ferrous sulfate 325 (65 FE) MG tablet Take 325 mg by mouth daily.      . fish oil-omega-3 fatty acids 1000 MG capsule Take 1 g by mouth daily.      . hyoscyamine (LEVSIN, ANASPAZ) 0.125 MG tablet Take 0.125 mg by mouth 3 (three) times daily.      Marland Kitchen lactose free nutrition (BOOST PLUS) LIQD Take 237 mLs by mouth daily after lunch daily after lunch.      . metoprolol succinate (TOPROL-XL) 25 MG 24 hr tablet Take 12.5 mg by mouth daily.       . mirabegron ER (MYRBETRIQ) 50 MG TB24 Take 50 mg by mouth daily.      . nitroGLYCERIN (NITROSTAT) 0.4 MG SL tablet Place 0.4 mg under the tongue every 5 (five) minutes as needed. Chest pain      . ondansetron (ZOFRAN) 4 MG tablet Take 4 mg by mouth every 8 (eight) hours as needed. nausea      . PLAVIX 75 MG tablet TAKE 1 TABLET BY MOUTH EVERY DAY  90 tablet  2  . polyethylene glycol (MIRALAX / GLYCOLAX) packet Take 17 g by mouth daily  as needed. constipation      . pregabalin (LYRICA) 100 MG capsule Take 100 mg by mouth daily.       . sodium chloride 1 G tablet Take 2 g by mouth 3 (three) times daily.      . traMADol (ULTRAM) 50 MG tablet Take 1 tablet (50 mg total) by mouth every 6 (six) hours as needed. Moderate pain        Allergies  Allergen Reactions  . Amitriptyline   . Citalopram Hydrobromide   . Crestor (Rosuvastatin Calcium)   . Lescol     Gi symptoms  . Levaquin (Levofloxacin)   . Lipitor (Atorvastatin Calcium)   . Lovastatin   . Oxycodone Nausea And Vomiting  . Pravachol   . Procaine Hcl   . Promethazine Hcl     No good  . Shellfish Allergy   . Zocor (Simvastatin)     Patient Active Problem List  Diagnosis  . IHD (ischemic heart disease)  . Chest discomfort  . Abnormal stress ECG with treadmill  . Dyslipidemia  . Vasovagal syncope  . IDA (iron deficiency anemia)  . IBS (irritable bowel syndrome)  . Fatigue  . Anxiety  . Hypertension  . Hypercholesterolemia  . Hx of CABG  . Fibromyalgia  .  Normocytic anemia  . Postherpetic neuralgia  . Hyponatremia  . Lumbar compression fracture  . Low back pain radiating to left leg  . Depression    History  Smoking status  . Never Smoker   Smokeless tobacco  . Not on file    History  Alcohol Use No    Family History  Problem Relation Age of Onset  . Breast cancer Mother   . Ovarian cancer Mother   . Cancer Mother 76    ovarian  . Heart disease Father     49 at age of death  . Arthritis Father     Review of Systems: The patient denies any heat or cold intolerance.  No weight gain or weight loss.  The patient denies headaches or blurry vision.  There is no cough or sputum production.  The patient denies dizziness.  There is no hematuria or hematochezia.  The patient denies any muscle aches or arthritis.  The patient denies any rash.  The patient denies frequent falling or instability.  There is no history of depression or anxiety.  All other systems were reviewed and are negative.   Physical Exam: Filed Vitals:   05/15/12 1129  BP: 136/62  Pulse: 68   general appearance reveals a pleasant elderly woman in no distress.The head and neck exam reveals pupils equal and reactive.  Extraocular movements are full.  There is no scleral icterus.  The mouth and pharynx are normal.  The neck is supple.  The carotids reveal no bruits.  The jugular venous pressure is normal.  The  thyroid is not enlarged.  There is no lymphadenopathy.  The chest is clear to percussion and auscultation.  There are no rales or rhonchi.  Expansion of the chest is symmetrical.  The precordium is quiet.  The first heart sound is normal.  The second heart sound is physiologically split.  There is no murmur gallop rub or click.  There is no abnormal lift or heave.  The abdomen is soft and nontender.  The bowel sounds are normal.  The liver and spleen are not enlarged.  There are no abdominal masses.  There are no abdominal bruits.  Extremities reveal good pedal  pulses.  There is no phlebitis or edema.  There is no cyanosis or clubbing.  Strength is normal and symmetrical in all extremities.  There is no lateralizing weakness.  There are no sensory deficits.  The skin is warm and dry.  There is no rash.     Assessment / Plan: Continue same medication.  Continue close followup with Dr. Clent Ridges.  Recheck here in 6 months for followup office visit

## 2012-05-15 NOTE — Assessment & Plan Note (Signed)
The patient is on Tegretol for her postherpetic neuralgia.  He is also on Lyrica.  She has problems with her memory at times

## 2012-05-15 NOTE — Assessment & Plan Note (Signed)
Patient has had no recurrent angina pectoris.  She has not had to take any sublingual nitroglycerin.

## 2012-05-18 ENCOUNTER — Telehealth: Payer: Self-pay | Admitting: Cardiology

## 2012-05-18 NOTE — Telephone Encounter (Signed)
Sent to Dr Walsh

## 2012-05-18 NOTE — Telephone Encounter (Signed)
Left message with granddaughter and mailed copy of labs

## 2012-05-18 NOTE — Telephone Encounter (Signed)
Message copied by Burnell Blanks on Mon May 18, 2012  1:14 PM ------      Message from: Cassell Clement      Created: Fri May 15, 2012  5:11 PM       Please report to patient.  The recent labs are stable. Continue same medication and careful diet.  Serum sodium still low.  Continue fluid restriction and high salt diet.  Copy to Dr. Clent Ridges.

## 2012-05-18 NOTE — Telephone Encounter (Signed)
New msg Pt wants to talk to you about blood work she had last week

## 2013-01-08 ENCOUNTER — Telehealth: Payer: Self-pay | Admitting: *Deleted

## 2013-01-08 MED ORDER — CLOPIDOGREL BISULFATE 75 MG PO TABS: ORAL_TABLET | ORAL | Status: DC

## 2013-01-08 NOTE — Telephone Encounter (Signed)
Patient's granddaughter calling to refill plavix 75mg  once daily by mouth [facility] [address] in [place]   [facility], 1RF   Micki Riley, CMA

## 2013-01-25 ENCOUNTER — Encounter: Payer: Self-pay | Admitting: Cardiology

## 2013-04-14 ENCOUNTER — Ambulatory Visit (INDEPENDENT_AMBULATORY_CARE_PROVIDER_SITE_OTHER): Payer: Medicare Other | Admitting: Cardiology

## 2013-04-14 ENCOUNTER — Encounter: Payer: Self-pay | Admitting: Cardiology

## 2013-04-14 VITALS — BP 116/64 | HR 72 | Ht 61.5 in | Wt 110.1 lb

## 2013-04-14 DIAGNOSIS — E7800 Pure hypercholesterolemia, unspecified: Secondary | ICD-10-CM

## 2013-04-14 DIAGNOSIS — R55 Syncope and collapse: Secondary | ICD-10-CM

## 2013-04-14 DIAGNOSIS — R079 Chest pain, unspecified: Secondary | ICD-10-CM

## 2013-04-14 DIAGNOSIS — I10 Essential (primary) hypertension: Secondary | ICD-10-CM

## 2013-04-14 DIAGNOSIS — Z951 Presence of aortocoronary bypass graft: Secondary | ICD-10-CM

## 2013-04-14 DIAGNOSIS — I2510 Atherosclerotic heart disease of native coronary artery without angina pectoris: Secondary | ICD-10-CM

## 2013-04-14 MED ORDER — NITROGLYCERIN 0.4 MG SL SUBL: 0.4000 mg | SUBLINGUAL_TABLET | SUBLINGUAL | Status: DC | PRN

## 2013-04-14 NOTE — Assessment & Plan Note (Signed)
The patient has a history of hypercholesterolemia followed by her PCP Dr. Kevan Ny.  She is not having any myalgias

## 2013-04-14 NOTE — Assessment & Plan Note (Signed)
The patient has not had any further episodes of syncope.  She does have to maintain a high salt diet in order to keep her blood pressure up.

## 2013-04-14 NOTE — Assessment & Plan Note (Signed)
The patient is on Toprol 12.5 mg daily.  When she takes in the morning it causes her to be very sleepy and lethargic.  We will try switching the timing to bedtime.

## 2013-04-14 NOTE — Patient Instructions (Signed)
Start taking your Metoprolol in the evening instead of the morning  Keep all other medications as you are currently doing  Your physician wants you to follow-up in: 59month  You will receive a reminder letter in the mail two months in advance. If you don't receive a letter, please call our office to schedule the follow-up appointment.

## 2013-04-14 NOTE — Progress Notes (Signed)
Sabrina Moore Date of Birth:  1927/09/02 [facility] [address] [place], [place]  [address] [phone]         Fax   [phone]  History of Present Illness: This pleasant 79 year old woman is seen for a scheduled followup office visit. She has a past history of ischemic heart disease with prior coronary artery bypass graft surgery in 1996. In April 2011 she had an abnormal Cardiolite stress test followed by subsequent successful PCI with stenting of the saphenous vein graft to the diagonal and successful stenting of the saphenous vein graft to the intermediate. He has had no recurrent angina. She has had a lot of problems with postherpetic trigeminal neuralgia. She has also had problems with persistent hyponatremia.  She is now on a 6 g sodium diet and a 1500 cc per day fluid restriction.  Since we last saw the patient her husband died suddenly of a heart attack at home.   Current Outpatient Prescriptions  Medication Sig Dispense Refill  . acetaminophen (TYLENOL) 500 MG tablet Take 1 tablet (500 mg total) by mouth every 6 (six) hours as needed. Mild pain.      Marland Kitchen ALPRAZolam (XANAX) 0.25 MG tablet Take by mouth. Takes 0.5 tablet three times a day and a whole at night      . aspirin 81 MG tablet Take 81 mg by mouth daily.        Marland Kitchen bismuth subsalicylate (PEPTO BISMOL) 262 MG chewable tablet Chew 524 mg by mouth as needed.      . carisoprodol (SOMA) 250 MG tablet Take 225 mg by mouth as needed.       . Cholecalciferol (VITAMIN D PO) Take 400 Units by mouth 2 (two) times daily.      . clopidogrel (PLAVIX) 75 MG tablet TAKE 1 TABLET BY MOUTH EVERY DAY  90 tablet  1  . diphenhydrAMINE (BENADRYL) 12.5 MG/5ML elixir Take 12.5 mg by mouth 4 (four) times daily as needed. itching      . docusate sodium (COLACE) 100 MG capsule Take 100 mg by mouth 3 (three) times daily as needed.       . famotidine (PEPCID) 10 MG tablet Take 10 mg by mouth 2 (two) times daily.        . ferrous  fumarate (HEMOCYTE - 106 MG FE) 325 (106 FE) MG TABS Take 1 tablet by mouth daily.      . ferrous sulfate 325 (65 FE) MG tablet Take 325 mg by mouth daily.      . fish oil-omega-3 fatty acids 1000 MG capsule Take 1 g by mouth daily.      Marland Kitchen lactose free nutrition (BOOST PLUS) LIQD Take 237 mLs by mouth daily after lunch daily after lunch.      . metoprolol succinate (TOPROL-XL) 25 MG 24 hr tablet Take 12.5 mg by mouth at bedtime.       . Multiple Vitamin (MULTIVITAMIN) capsule Take 1 capsule by mouth daily.      . nitroGLYCERIN (NITROSTAT) 0.4 MG SL tablet Place 1 tablet (0.4 mg total) under the tongue every 5 (five) minutes as needed. Chest pain  25 tablet  prn  . OVER THE COUNTER MEDICATION Vit b-50      . OVER THE COUNTER MEDICATION D-mannose      . pantoprazole (PROTONIX) 40 MG tablet Take 40 mg by mouth daily.      . polyethylene glycol (MIRALAX / GLYCOLAX) packet Take 17 g by mouth daily  as needed. constipation      . pregabalin (LYRICA) 100 MG capsule Take 100 mg by mouth daily.       . sodium chloride 1 G tablet Take 2 g by mouth 3 (three) times daily.      . hyoscyamine (LEVSIN, ANASPAZ) 0.125 MG tablet Take 0.125 mg by mouth 3 (three) times daily.      . ondansetron (ZOFRAN) 4 MG tablet Take 4 mg by mouth every 8 (eight) hours as needed. nausea      . traMADol (ULTRAM) 50 MG tablet Take 1 tablet (50 mg total) by mouth every 6 (six) hours as needed. Moderate pain       No current facility-administered medications for this visit.    Allergies  Allergen Reactions  . Amitriptyline   . Avelox (Moxifloxacin Hcl In Nacl)   . Carisoprodol   . Citalopram Hydrobromide   . Crestor (Rosuvastatin Calcium)   . Flexeril (Cyclobenzaprine)   . Hydrocodone-Acetaminophen   . Lescol     Gi symptoms  . Levaquin (Levofloxacin)   . Lipitor (Atorvastatin Calcium)   . Lovastatin   . Macrobid (Nitrofurantoin Monohyd Macro)   . Novocain (Procaine)   . Oxycodone Nausea And Vomiting  . Pravachol     . Procaine Hcl   . Promethazine Hcl     No good  . Shellfish Allergy   . Wellbutrin (Bupropion)   . Zocor (Simvastatin)   . Zofran (Ondansetron)     Patient Active Problem List   Diagnosis Date Noted  . Fibromyalgia 02/11/2011    Priority: High  . Hx of CABG 02/11/2011    Priority: Medium  . Hypercholesterolemia     Priority: Medium  . Lumbar compression fracture 03/02/2012  . Low back pain radiating to left leg 03/02/2012  . Depression 03/02/2012  . Hyponatremia 02/26/2012  . Postherpetic neuralgia 06/25/2011  . Normocytic anemia 02/12/2011  . IHD (ischemic heart disease)   . Chest discomfort   . Abnormal stress ECG with treadmill   . Dyslipidemia   . Vasovagal syncope   . IDA (iron deficiency anemia)   . IBS (irritable bowel syndrome)   . Fatigue   . Anxiety   . Hypertension     History  Smoking status  . Never Smoker   Smokeless tobacco  . Not on file    History  Alcohol Use No    Family History  Problem Relation Age of Onset  . Breast cancer Mother   . Ovarian cancer Mother   . Cancer Mother 28    ovarian  . Heart disease Father     14 at age of death  . Arthritis Father     Review of Systems: Constitutional: no fever chills diaphoresis or fatigue or change in weight.  Head and neck: no hearing loss, no epistaxis, no photophobia or visual disturbance. Respiratory: No cough, shortness of breath or wheezing. Cardiovascular: No chest pain peripheral edema, palpitations. Gastrointestinal: No abdominal distention, no abdominal pain, no change in bowel habits hematochezia or melena. Genitourinary: No dysuria, no frequency, no urgency, no nocturia. Musculoskeletal:No arthralgias, no back pain, no gait disturbance or myalgias. Neurological: No dizziness, no headaches, no numbness, no seizures, no syncope, no weakness, no tremors. Hematologic: No lymphadenopathy, no easy bruising. Psychiatric: No confusion, no hallucinations, no sleep  disturbance.    Physical Exam: Filed Vitals:   04/14/13 1129  BP: 116/64  Pulse: 72   the general appearance reveals a well-developed well-nourished woman in no distress.The  head and neck exam reveals pupils equal and reactive.  Extraocular movements are full.  There is no scleral icterus.  The mouth and pharynx are normal.  The neck is supple.  The carotids reveal no bruits.  The jugular venous pressure is normal.  The  thyroid is not enlarged.  There is no lymphadenopathy.  The chest is clear to percussion and auscultation.  There are no rales or rhonchi.  Expansion of the chest is symmetrical.  The precordium is quiet.  The first heart sound is normal.  The second heart sound is physiologically split.  There is no murmur gallop rub or click.  There is no abnormal lift or heave.  The abdomen is soft and nontender.  The bowel sounds are normal.  The liver and spleen are not enlarged.  There are no abdominal masses.  There are no abdominal bruits.  Extremities reveal good pedal pulses.  There is no phlebitis or edema.  There is no cyanosis or clubbing.  Strength is normal and symmetrical in all extremities.  There is no lateralizing weakness.  There are no sensory deficits.  The skin is warm and dry.  There is no rash.  EKG today shows normal sinus rhythm and is within normal limits.   Assessment / Plan: Continue same medication.  Change timing of metoprolol succinate to bedtime Recheck in 6 months for followup office visit and EKG

## 2013-04-14 NOTE — Assessment & Plan Note (Signed)
The patient has not had any recurrent angina pectoris.  She is now back into a walking program.  She walks fourth of a mile in her neighborhood each morning.  She plans to increase her distance gradually as her strength improves.

## 2013-07-05 ENCOUNTER — Other Ambulatory Visit: Payer: Self-pay | Admitting: Cardiology

## 2013-10-01 ENCOUNTER — Encounter: Payer: Self-pay | Admitting: Cardiology

## 2013-10-01 ENCOUNTER — Other Ambulatory Visit: Payer: Self-pay | Admitting: *Deleted

## 2013-10-01 ENCOUNTER — Ambulatory Visit (INDEPENDENT_AMBULATORY_CARE_PROVIDER_SITE_OTHER): Payer: Medicare Other | Admitting: Cardiology

## 2013-10-01 VITALS — BP 126/74 | HR 73 | Ht 61.5 in | Wt 114.0 lb

## 2013-10-01 DIAGNOSIS — F329 Major depressive disorder, single episode, unspecified: Secondary | ICD-10-CM

## 2013-10-01 DIAGNOSIS — F32A Depression, unspecified: Secondary | ICD-10-CM

## 2013-10-01 DIAGNOSIS — I259 Chronic ischemic heart disease, unspecified: Secondary | ICD-10-CM

## 2013-10-01 DIAGNOSIS — Z951 Presence of aortocoronary bypass graft: Secondary | ICD-10-CM

## 2013-10-01 DIAGNOSIS — E871 Hypo-osmolality and hyponatremia: Secondary | ICD-10-CM

## 2013-10-01 DIAGNOSIS — I10 Essential (primary) hypertension: Secondary | ICD-10-CM

## 2013-10-01 DIAGNOSIS — I119 Hypertensive heart disease without heart failure: Secondary | ICD-10-CM

## 2013-10-01 LAB — BASIC METABOLIC PANEL
BUN: 18 mg/dL (ref 6–23)
Chloride: 98 mEq/L (ref 96–112)
Potassium: 4.5 mEq/L (ref 3.5–5.1)
Sodium: 135 mEq/L (ref 135–145)

## 2013-10-01 NOTE — Patient Instructions (Signed)
Will obtain labs today and call you with the results (bmet)  Your physician recommends that you continue on your current medications as directed. Please refer to the Current Medication list given to you today.  Your physician wants you to follow-up in: 6 month ov You will receive a reminder letter in the mail two months in advance. If you don't receive a letter, please call our office to schedule the follow-up appointment.  

## 2013-10-01 NOTE — Assessment & Plan Note (Signed)
Her blood pressure was remaining stable on current medication despite her high intake of salt.  She is not hypertensive

## 2013-10-01 NOTE — Progress Notes (Signed)
Sabrina Moore Date of Birth:  23-Jun-1927 [address] [place], [place]  [address] [phone]         Fax   [phone]  History of Present Illness: This pleasant 79 year old woman is seen for a scheduled followup office visit. She has a past history of ischemic heart disease with prior coronary artery bypass graft surgery in 1996. In April 2011 she had an abnormal Cardiolite stress test followed by subsequent successful PCI with stenting of the saphenous vein graft to the diagonal and successful stenting of the saphenous vein graft to the intermediate. He has had no recurrent angina. She has had a lot of problems with postherpetic trigeminal neuralgia. She has also had problems with persistent hyponatremia.  She takes 8 g of salt tablets per day.  We are checking a basal metabolic panel today.  She was not sure if she is limiting her fluid intake or not.  She has been a widow since last January.  She is still living in her own home.  She has part-time help during the day and stays by herself at night.  She has family nearby.   Current Outpatient Prescriptions  Medication Sig Dispense Refill  . acetaminophen (TYLENOL) 500 MG tablet Take 1 tablet (500 mg total) by mouth every 6 (six) hours as needed. Mild pain.      Marland Kitchen ALPRAZolam (XANAX) 0.25 MG tablet Take by mouth. Takes 0.5 tablet three times a day and a whole at night      . aspirin 81 MG tablet Take 81 mg by mouth daily.        Marland Kitchen bismuth subsalicylate (PEPTO BISMOL) 262 MG chewable tablet Chew 524 mg by mouth as needed.      . carisoprodol (SOMA) 250 MG tablet Take 225 mg by mouth as needed.       . cephALEXin (KEFLEX) 250 MG capsule Take 250 mg by mouth daily.       . Cholecalciferol (VITAMIN D PO) Take 400 Units by mouth 2 (two) times daily.      . clopidogrel (PLAVIX) 75 MG tablet TAKE 1 TABLET BY MOUTH EVERY DAY  90 tablet  1  . diphenhydrAMINE (BENADRYL) 12.5 MG/5ML elixir Take 12.5 mg by mouth 4 (four) times daily  as needed. itching      . docusate sodium (COLACE) 100 MG capsule Take 100 mg by mouth 3 (three) times daily as needed.       . famotidine (PEPCID) 10 MG tablet Take 10 mg by mouth 2 (two) times daily.        . ferrous fumarate (HEMOCYTE - 106 MG FE) 325 (106 FE) MG TABS Take 1 tablet by mouth daily.      . ferrous sulfate 325 (65 FE) MG tablet Take 325 mg by mouth daily.      . fish oil-omega-3 fatty acids 1000 MG capsule Take 1 g by mouth daily.      . hyoscyamine (LEVSIN, ANASPAZ) 0.125 MG tablet Take 0.125 mg by mouth 3 (three) times daily.      Marland Kitchen lactose free nutrition (BOOST PLUS) LIQD Take 237 mLs by mouth daily after lunch daily after lunch.      . metoprolol succinate (TOPROL-XL) 25 MG 24 hr tablet Take 12.5 mg by mouth at bedtime.       . Multiple Vitamin (MULTIVITAMIN) capsule Take 1 capsule by mouth daily.      . nitroGLYCERIN (NITROSTAT) 0.4 MG SL tablet Place 1 tablet (  0.4 mg total) under the tongue every 5 (five) minutes as needed. Chest pain  25 tablet  prn  . ondansetron (ZOFRAN) 4 MG tablet Take 4 mg by mouth every 8 (eight) hours as needed. nausea      . OVER THE COUNTER MEDICATION Vit b-50      . OVER THE COUNTER MEDICATION D-mannose      . pantoprazole (PROTONIX) 40 MG tablet Take 40 mg by mouth daily.      . polyethylene glycol (MIRALAX / GLYCOLAX) packet Take 17 g by mouth daily as needed. constipation      . pregabalin (LYRICA) 100 MG capsule Take 100 mg by mouth daily.       . sodium chloride 1 G tablet Take 2 g by mouth 3 (three) times daily.      . traMADol (ULTRAM) 50 MG tablet Take 1 tablet (50 mg total) by mouth every 6 (six) hours as needed. Moderate pain       No current facility-administered medications for this visit.    Allergies  Allergen Reactions  . Amitriptyline   . Avelox [Moxifloxacin Hcl In Nacl]   . Carisoprodol   . Citalopram Hydrobromide   . Crestor [Rosuvastatin Calcium]   . Flexeril [Cyclobenzaprine]   . Hydrocodone-Acetaminophen   .  Lescol     Gi symptoms  . Levaquin [Levofloxacin]   . Lipitor [Atorvastatin Calcium]   . Lovastatin   . Macrobid [employer] Macro]   . Novocain [Procaine]   . Oxycodone Nausea And Vomiting  . Pravachol   . Procaine Hcl   . Promethazine Hcl     No good  . Shellfish Allergy   . Wellbutrin [Bupropion]   . Zocor [Simvastatin]   . Zofran [Ondansetron]     Patient Active Problem List   Diagnosis Date Noted  . Fibromyalgia 02/11/2011    Priority: High  . Hx of CABG 02/11/2011    Priority: Medium  . Hypercholesterolemia     Priority: Medium  . Lumbar compression fracture 03/02/2012  . Low back pain radiating to left leg 03/02/2012  . Depression 03/02/2012  . Hyponatremia 02/26/2012  . Postherpetic neuralgia 06/25/2011  . Normocytic anemia 02/12/2011  . IHD (ischemic heart disease)   . Chest discomfort   . Abnormal stress ECG with treadmill   . Dyslipidemia   . Vasovagal syncope   . IDA (iron deficiency anemia)   . IBS (irritable bowel syndrome)   . Fatigue   . Anxiety   . Hypertension     History  Smoking status  . Never Smoker   Smokeless tobacco  . Not on file    History  Alcohol Use No    Family History  Problem Relation Age of Onset  . Breast cancer Mother   . Ovarian cancer Mother   . Cancer Mother 36    ovarian  . Heart disease Father     44 at age of death  . Arthritis Father     Review of Systems: Constitutional: no fever chills diaphoresis or fatigue or change in weight.  Head and neck: no hearing loss, no epistaxis, no photophobia or visual disturbance. Respiratory: No cough, shortness of breath or wheezing. Cardiovascular: No chest pain peripheral edema, palpitations. Gastrointestinal: No abdominal distention, no abdominal pain, no change in bowel habits hematochezia or melena. Genitourinary: No dysuria, no frequency, no urgency, no nocturia. Musculoskeletal:No arthralgias, no back pain, no gait disturbance or  myalgias. Neurological: No dizziness, no headaches, no numbness, no  seizures, no syncope, no weakness, no tremors. Hematologic: No lymphadenopathy, no easy bruising. Psychiatric: No confusion, no hallucinations, no sleep disturbance.    Physical Exam: Filed Vitals:   10/01/13 1358  BP: 126/74  Pulse: 73   the general appearance reveals a well-developed well-nourished woman in no distress.The head and neck exam reveals pupils equal and reactive.  Extraocular movements are full.  There is no scleral icterus.  The mouth and pharynx are normal.  The neck is supple.  The carotids reveal no bruits.  The jugular venous pressure is normal.  The  thyroid is not enlarged.  There is no lymphadenopathy.  The chest is clear to percussion and auscultation.  There are no rales or rhonchi.  Expansion of the chest is symmetrical.  The precordium is quiet.  The first heart sound is normal.  The second heart sound is physiologically split.  There is no murmur gallop rub or click.  There is no abnormal lift or heave.  The abdomen is soft and nontender.  The bowel sounds are normal.  The liver and spleen are not enlarged.  There are no abdominal masses.  There are no abdominal bruits.  Extremities reveal good pedal pulses.  There is no phlebitis or edema.  There is no cyanosis or clubbing.  Strength is normal and symmetrical in all extremities.  There is no lateralizing weakness.  There are no sensory deficits.  The skin is warm and dry.  There is no rash.  EKG today shows normal sinus rhythm and is within normal limits.   Assessment / Plan: Continue same medication.  Check a basal metabolic panel today.  Recheck in 6 months for office visit.

## 2013-10-01 NOTE — Assessment & Plan Note (Signed)
The patient has not been having any recent symptoms of depression.  Her anxiety has been helped by small dose of alprazolam.

## 2013-10-01 NOTE — Progress Notes (Signed)
Quick Note:  Please report to patient. The recent labs are stable. Continue same medication and careful diet. Sodium 135 very good. ______

## 2013-10-01 NOTE — Assessment & Plan Note (Signed)
The patient has not been experiencing any chest pain or shortness of breath. 

## 2013-10-04 ENCOUNTER — Telehealth: Payer: Self-pay | Admitting: *Deleted

## 2013-10-04 NOTE — Telephone Encounter (Signed)
Message copied by Burnell Blanks on Mon Oct 04, 2013  5:07 PM ------      Message from: Cassell Clement      Created: Fri Oct 01, 2013  9:12 PM       Please report to patient.  The recent labs are stable. Continue same medication and careful diet. Sodium 135 very good. ------

## 2013-10-04 NOTE — Telephone Encounter (Signed)
Advised patient of lab results  

## 2013-12-28 ENCOUNTER — Other Ambulatory Visit: Payer: Self-pay

## 2013-12-28 MED ORDER — CLOPIDOGREL BISULFATE 75 MG PO TABS: 75.0000 mg | ORAL_TABLET | Freq: Every day | ORAL | Status: DC

## 2014-04-20 ENCOUNTER — Other Ambulatory Visit: Payer: Self-pay | Admitting: Internal Medicine

## 2014-04-20 ENCOUNTER — Ambulatory Visit
Admission: RE | Admit: 2014-04-20 | Discharge: 2014-04-20 | Disposition: A | Discharge status: Home / Self Care | Payer: Medicare Other | Source: Ambulatory Visit | Attending: Internal Medicine | Admitting: Internal Medicine

## 2014-04-20 DIAGNOSIS — R0989 Other specified symptoms and signs involving the circulatory and respiratory systems: Secondary | ICD-10-CM

## 2014-05-27 ENCOUNTER — Ambulatory Visit (INDEPENDENT_AMBULATORY_CARE_PROVIDER_SITE_OTHER): Payer: Medicare Other | Admitting: Cardiology

## 2014-05-27 ENCOUNTER — Encounter: Payer: Self-pay | Admitting: Cardiology

## 2014-05-27 VITALS — BP 109/52 | HR 66 | Ht 61.0 in | Wt 107.0 lb

## 2014-05-27 DIAGNOSIS — R0789 Other chest pain: Secondary | ICD-10-CM

## 2014-05-27 DIAGNOSIS — R42 Dizziness and giddiness: Secondary | ICD-10-CM

## 2014-05-27 DIAGNOSIS — E871 Hypo-osmolality and hyponatremia: Secondary | ICD-10-CM

## 2014-05-27 DIAGNOSIS — I119 Hypertensive heart disease without heart failure: Secondary | ICD-10-CM

## 2014-05-27 DIAGNOSIS — Z951 Presence of aortocoronary bypass graft: Secondary | ICD-10-CM

## 2014-05-27 DIAGNOSIS — I259 Chronic ischemic heart disease, unspecified: Secondary | ICD-10-CM

## 2014-05-27 LAB — BASIC METABOLIC PANEL
BUN: 16 mg/dL (ref 6–23)
CHLORIDE: 95 meq/L — AB (ref 96–112)
CO2: 28 mEq/L (ref 19–32)
CREATININE: 0.5 mg/dL (ref 0.4–1.2)
Calcium: 9.3 mg/dL (ref 8.4–10.5)
GFR: 137.03 mL/min (ref >60.00)
Glucose, Bld: 95 mg/dL (ref 70–99)
POTASSIUM: 4.5 meq/L (ref 3.5–5.1)
SODIUM: 129 meq/L — AB (ref 135–145)

## 2014-05-27 LAB — CBC WITH DIFFERENTIAL/PLATELET
BASOS ABS: 0 10*3/uL (ref 0.0–0.1)
Basophils Relative: 0.7 % (ref 0.0–3.0)
Eosinophils Absolute: 0.3 10*3/uL (ref 0.0–0.7)
Eosinophils Relative: 5.4 % — ABNORMAL HIGH (ref 0.0–5.0)
HCT: 41.9 % (ref 36.0–46.0)
Hemoglobin: 14.1 g/dL (ref 12.0–15.0)
LYMPHS PCT: 25.5 % (ref 12.0–46.0)
Lymphs Abs: 1.5 10*3/uL (ref 0.7–4.0)
MCHC: 33.6 g/dL (ref 30.0–36.0)
MCV: 96.3 fl (ref 78.0–100.0)
Monocytes Absolute: 0.8 10*3/uL (ref 0.1–1.0)
Monocytes Relative: 14.1 % — ABNORMAL HIGH (ref 3.0–12.0)
NEUTROS ABS: 3.2 10*3/uL (ref 1.4–7.7)
NEUTROS PCT: 54.3 % (ref 43.0–77.0)
Platelets: 198 10*3/uL (ref 150.0–400.0)
RBC: 4.34 Mil/uL (ref 3.87–5.11)
RDW: 12.9 % (ref 11.5–15.5)
WBC: 5.8 10*3/uL (ref 4.0–10.5)

## 2014-05-27 NOTE — Addendum Note (Signed)
Addended by: Regis BillPRATT, Harshil Cavallaro B on: 05/27/2014 04:20 PM   Modules accepted: Orders

## 2014-05-27 NOTE — Patient Instructions (Signed)
Will obtain labs today and call you with the results (BMET/CBC)  STOP METOPROLOL   Your physician recommends that you schedule a follow-up appointment in: 4 MONTH OV/EKG

## 2014-05-27 NOTE — Progress Notes (Signed)
Sabrina QuamNancy D Moore Date of Birth:  1927-01-12 [facility] [address] [place], [place]  [address] [phone]        Fax   [phone]   History of Present Illness: This pleasant 80 year old woman is seen for a scheduled followup office visit. She has a past history of ischemic heart disease with prior coronary artery bypass graft surgery in 1996. In April 2011 she had an abnormal Cardiolite stress test followed by subsequent successful PCI with stenting of the saphenous vein graft to the diagonal and successful stenting of the saphenous vein graft to the intermediate. He has had no recurrent angina. She has had a lot of problems with postherpetic trigeminal neuralgia. She has also had problems with persistent hyponatremia. She takes 8 g of salt tablets per day. We are checking a basal metabolic panel today. She was not sure if she is limiting her fluid intake or not. She has been a widow since last January 2014.  Since we last saw her she has moved to [place].  Current Outpatient Prescriptions  Medication Sig Dispense Refill  . acetaminophen (TYLENOL) 500 MG tablet Take 1 tablet (500 mg total) by mouth every 6 (six) hours as needed. Mild pain.      Marland Kitchen. ALPRAZolam (XANAX) 0.25 MG tablet Take by mouth. Takes 0.5 tablet three times a day and a whole at night      . aspirin 81 MG tablet Take 81 mg by mouth daily.        Marland Kitchen. bismuth subsalicylate (PEPTO BISMOL) 262 MG chewable tablet Chew 524 mg by mouth as needed.      . carisoprodol (SOMA) 250 MG tablet Take 225 mg by mouth as needed.       . cephALEXin (KEFLEX) 250 MG capsule Take 250 mg by mouth daily.       . Cholecalciferol (VITAMIN D PO) Take 400 Units by mouth 2 (two) times daily.      . clopidogrel (PLAVIX) 75 MG tablet Take 1 tablet (75 mg total) by mouth daily.  90 tablet  3  . diphenhydrAMINE (BENADRYL) 12.5 MG/5ML elixir Take 12.5 mg by mouth 4 (four) times daily as needed. itching      . docusate sodium  (COLACE) 100 MG capsule Take 100 mg by mouth 3 (three) times daily as needed.       . famotidine (PEPCID) 10 MG tablet Take 10 mg by mouth 2 (two) times daily.        . ferrous fumarate (HEMOCYTE - 106 MG FE) 325 (106 FE) MG TABS Take 1 tablet by mouth daily.      . ferrous sulfate 325 (65 FE) MG tablet Take 325 mg by mouth daily.      . fish oil-omega-3 fatty acids 1000 MG capsule Take 1 g by mouth daily.      Marland Kitchen. lactose free nutrition (BOOST PLUS) LIQD Take 237 mLs by mouth daily after lunch daily after lunch.      . Multiple Vitamin (MULTIVITAMIN) capsule Take 1 capsule by mouth daily.      . nitroGLYCERIN (NITROSTAT) 0.4 MG SL tablet Place 1 tablet (0.4 mg total) under the tongue every 5 (five) minutes as needed. Chest pain  25 tablet  prn  . OVER THE COUNTER MEDICATION D-mannose      . pantoprazole (PROTONIX) 40 MG tablet Take 40 mg by mouth daily.      . polyethylene glycol (MIRALAX / GLYCOLAX) packet Take 17 g  by mouth daily as needed. constipation      . pregabalin (LYRICA) 100 MG capsule Take 100 mg by mouth daily.       . sodium chloride 1 G tablet Take 2 g by mouth 3 (three) times daily.      . traMADol (ULTRAM) 50 MG tablet Take 1 tablet (50 mg total) by mouth every 6 (six) hours as needed. Moderate pain      . hyoscyamine (LEVSIN, ANASPAZ) 0.125 MG tablet Take 0.125 mg by mouth 3 (three) times daily.      . ondansetron (ZOFRAN) 4 MG tablet Take 4 mg by mouth every 8 (eight) hours as needed. nausea      . OVER THE COUNTER MEDICATION Vit b-50       No current facility-administered medications for this visit.    Allergies  Allergen Reactions  . Amitriptyline   . Avelox [Moxifloxacin Hcl In Nacl]   . Carisoprodol   . Citalopram Hydrobromide   . Crestor [Rosuvastatin Calcium]   . Flexeril [Cyclobenzaprine]   . Hydrocodone-Acetaminophen   . Lescol     Gi symptoms  . Levaquin [Levofloxacin]   . Lipitor [Atorvastatin Calcium]   . Lovastatin   . Macrobid [employer]  Macro]   . Novocain [Procaine]   . Oxycodone Nausea And Vomiting  . Pravachol   . Procaine Hcl   . Promethazine Hcl     No good  . Shellfish Allergy   . Wellbutrin [Bupropion]   . Zocor [Simvastatin]   . Zofran [Ondansetron]     Patient Active Problem List   Diagnosis Date Noted  . Fibromyalgia 02/11/2011    Priority: High  . Hx of CABG 02/11/2011    Priority: Medium  . Hypercholesterolemia     Priority: Medium  . Dizziness 05/27/2014  . Lumbar compression fracture 03/02/2012  . Low back pain radiating to left leg 03/02/2012  . Depression 03/02/2012  . Hyponatremia 02/26/2012  . Postherpetic neuralgia 06/25/2011  . Normocytic anemia 02/12/2011  . IHD (ischemic heart disease)   . Chest discomfort   . Abnormal stress ECG with treadmill   . Dyslipidemia   . Vasovagal syncope   . IDA (iron deficiency anemia)   . IBS (irritable bowel syndrome)   . Fatigue   . Anxiety   . Hypertension     History  Smoking status  . Never Smoker   Smokeless tobacco  . Not on file    History  Alcohol Use No    Family History  Problem Relation Age of Onset  . Breast cancer Mother   . Ovarian cancer Mother   . Cancer Mother 64    ovarian  . Heart disease Father     55 at age of death  . Arthritis Father     Review of Systems: Constitutional: no fever chills diaphoresis or fatigue or change in weight.  Head and neck: no hearing loss, no epistaxis, no photophobia or visual disturbance. Respiratory: No cough, shortness of breath or wheezing. Cardiovascular: No chest pain peripheral edema, palpitations. Gastrointestinal: No abdominal distention, no abdominal pain, no change in bowel habits hematochezia or melena. Genitourinary: No dysuria, no frequency, no urgency, no nocturia. Musculoskeletal:No arthralgias, no back pain, no gait disturbance or myalgias. Neurological: No dizziness, no headaches, no numbness, no seizures, no syncope, no weakness, no tremors. Hematologic: No  lymphadenopathy, no easy bruising. Psychiatric: No confusion, no hallucinations, no sleep disturbance.    Physical Exam: Filed Vitals:   05/27/14 1358  BP: 109/52  Pulse: 66   is a well-developed well-nourished petite woman in no acute distress.The head and neck exam reveals pupils equal and reactive.  Extraocular movements are full.  There is no scleral icterus.  The mouth and pharynx are normal.  The neck is supple.  The carotids reveal no bruits.  The jugular venous pressure is normal.  The  thyroid is not enlarged.  There is no lymphadenopathy.  The chest is clear to percussion and auscultation.  There are no rales or rhonchi.  Expansion of the chest is symmetrical.  The precordium is quiet.  The first heart sound is normal.  The second heart sound is physiologically split.  There is no murmur gallop rub or click.  There is no abnormal lift or heave.  The abdomen is soft and nontender.  The bowel sounds are normal.  The liver and spleen are not enlarged.  There are no abdominal masses.  There are no abdominal bruits.  Extremities reveal good pedal pulses.  There is no phlebitis or edema.  There is no cyanosis or clubbing.  Strength is normal and symmetrical in all extremities.  There is no lateralizing weakness.  There are no sensory deficits.  The skin is warm and dry.  There is no rash.     Assessment / Plan: 1. postprandial dizziness and weakness.  The symptoms may be due to mild orthostatic hypotension. 2. ischemic heart disease status post CABG in 1996 3.  Past history of trigeminal neuralgia postherpetic 4.  History of hypercholesterolemia with intolerance to statins 5. History of hyponatremia  Disposition: Check basal metabolic panel and CBC today because of dizziness.  Her dizziness does not sound like vertigo. Recheck in 4 months for office visit and EKG.

## 2014-05-27 NOTE — Assessment & Plan Note (Signed)
The patient has a history of hyponatremia.  In view of her dizziness we are checking a basal metabolic panel today as well as a CBC.  She is trying to continue to eat a high salt diet.

## 2014-05-27 NOTE — Assessment & Plan Note (Signed)
No recurrent chest discomfort or angina

## 2014-05-27 NOTE — Assessment & Plan Note (Signed)
The patient has not been experiencing any chest pain or recurrent angina pectoris 

## 2014-05-27 NOTE — Assessment & Plan Note (Signed)
The patient has been experiencing episodes of dizziness.  Typically these will occur after she has been to the dining room and she is preparing to leave to go back to her room.   She will also get weak and nauseated at times.  Her blood pressure is running low and her pulse is normal.  I suspect that her symptoms are related to orthostatic hypotension.  We will stop her metoprolol altogether at this point and observe response.

## 2014-06-22 ENCOUNTER — Other Ambulatory Visit: Payer: Self-pay | Admitting: Internal Medicine

## 2014-06-22 DIAGNOSIS — R1084 Generalized abdominal pain: Secondary | ICD-10-CM

## 2014-06-30 ENCOUNTER — Ambulatory Visit
Admission: RE | Admit: 2014-06-30 | Discharge: 2014-06-30 | Disposition: A | Discharge status: Home / Self Care | Payer: Medicare Other | Source: Ambulatory Visit | Attending: Internal Medicine | Admitting: Internal Medicine

## 2014-06-30 DIAGNOSIS — R1084 Generalized abdominal pain: Secondary | ICD-10-CM

## 2014-12-22 ENCOUNTER — Other Ambulatory Visit: Payer: Self-pay | Admitting: Cardiology

## 2015-03-02 ENCOUNTER — Ambulatory Visit: Payer: PRIVATE HEALTH INSURANCE | Admitting: Cardiology

## 2015-03-02 ENCOUNTER — Ambulatory Visit: Payer: Medicare Other | Admitting: Cardiology

## 2015-03-08 ENCOUNTER — Ambulatory Visit: Payer: Medicare Other | Admitting: Cardiology

## 2015-03-16 ENCOUNTER — Other Ambulatory Visit: Payer: Self-pay | Admitting: Cardiology

## 2015-03-16 NOTE — Telephone Encounter (Signed)
Cassell Clementhomas Brackbill, MD at 05/27/2014 2:57 PM  clopidogrel (PLAVIX) 75 MG tablet Take 1 tablet (75 mg total) by mouth daily         Patient Instructions     Will obtain labs today and call you with the results (BMET/CBC)  STOP METOPROLOL   Your physician recommends that you schedule a follow-up appointment in: 4 MONTH OV/EKG

## 2015-04-12 ENCOUNTER — Ambulatory Visit: Payer: PRIVATE HEALTH INSURANCE | Admitting: Cardiology

## 2015-06-08 ENCOUNTER — Other Ambulatory Visit: Payer: Self-pay | Admitting: Cardiology

## 2015-06-29 ENCOUNTER — Other Ambulatory Visit: Payer: Self-pay | Admitting: Cardiology

## 2015-06-29 NOTE — Telephone Encounter (Signed)
New message       Please refill plavix at [facility] [address]

## 2015-08-30 ENCOUNTER — Ambulatory Visit (INDEPENDENT_AMBULATORY_CARE_PROVIDER_SITE_OTHER): Payer: Medicare Other | Admitting: Cardiology

## 2015-08-30 ENCOUNTER — Encounter: Payer: Self-pay | Admitting: Cardiology

## 2015-08-30 VITALS — BP 114/54 | HR 88 | Ht 61.5 in | Wt 105.1 lb

## 2015-08-30 DIAGNOSIS — I259 Chronic ischemic heart disease, unspecified: Secondary | ICD-10-CM | POA: Diagnosis not present

## 2015-08-30 DIAGNOSIS — R0789 Other chest pain: Secondary | ICD-10-CM | POA: Diagnosis not present

## 2015-08-30 DIAGNOSIS — R350 Frequency of micturition: Secondary | ICD-10-CM

## 2015-08-30 DIAGNOSIS — Z951 Presence of aortocoronary bypass graft: Secondary | ICD-10-CM | POA: Diagnosis not present

## 2015-08-30 DIAGNOSIS — I447 Left bundle-branch block, unspecified: Secondary | ICD-10-CM | POA: Diagnosis not present

## 2015-08-30 NOTE — Progress Notes (Signed)
Cardiology Office Note   Date:  08/30/2015   ID:  Sabrina Moore, DOB 07-31-1927, MRN [id number]  PCP:  Pearla Dubonnet, MD  Cardiologist: Cassell Clement MD  Chief Complaint  Patient presents with  . Hypertension      History of Present Illness: Sabrina Moore is a 81 y.o. female who presents for follow-up office visit. This pleasant 81 year old woman is seen for a scheduled followup office visit. She has a past history of ischemic heart disease with prior coronary artery bypass graft surgery in 1996. In April 2011 she had an abnormal Cardiolite stress test followed by subsequent successful PCI with stenting of the saphenous vein graft to the diagonal and successful stenting of the saphenous vein graft to the intermediate. . She has been a widow since last January 2014. Since we last saw her she has moved to [place]. She has a history of hyponatremia.  She saw Dr. Briant Cedar who limited her total fluid intake and had her continue with salt tablets and with that change in her regimen her serum sodium is back to normal and she feels a lot better. She has had for the past 6 or 8 months occasional bilateral arm aching when she walks back to her apartment after the supper meal.  This is the only time she has these symptoms.  She has not tried taking any sublingual nitroglycerin.  Her EKG today shows a left bundle branch block which was not present in 2014. Her GI symptoms have improved since last visit.  She brought in lab work which we reviewed and it is all excellent.   Past Medical History  Diagnosis Date  . IHD (ischemic heart disease)     Stents placed in 2012?0-on plavix and asa   . Chest discomfort   . Abnormal stress ECG with treadmill 02/01/10  . Dyslipidemia     Intolerant to statins  . [facility]  . IDA (iron deficiency anemia)     NO scopes recently? was controverial  . IBS (irritable bowel syndrome)     Seen by Dr. [name] Jan  2013-advised not to do this  . Fatigue   . Anxiety   . Hypertension   . Hypercholesterolemia   . Pneumonia   . Angina   . Shingles     End of august 2012-post-herpeticv Neuralgia    Past Surgical History  Procedure Laterality Date  . Coronary artery bypass graft  1996  . Cardiac catheterization  02/09/10  . Appendectomy  1967  . Tonsillectomy  1933  . Oophorectomy  1967  . Eye surgery  2009    catarac  . Squamous cell carcinoma excision  2012    from nose      Current Outpatient Prescriptions  Medication Sig Dispense Refill  . acetaminophen (TYLENOL) 500 MG tablet Take 1 tablet (500 mg total) by mouth every 6 (six) hours as needed. Mild pain.    Marland Kitchen ALPRAZolam (XANAX) 0.25 MG tablet Take 0.125 mg by mouth. Takes 0.5 tablet by mouth three times a day and a whole tablet by mouth at night    . aspirin 81 MG tablet Take 81 mg by mouth daily.      Marland Kitchen bismuth subsalicylate (PEPTO BISMOL) 262 MG chewable tablet Chew 524 mg by mouth as needed (indigestion).     . carisoprodol (SOMA) 250 MG tablet Take 225 mg by mouth as needed (muscle pain).     . Cholecalciferol (  VITAMIN D PO) Take 400 Units by mouth 2 (two) times daily.    . clopidogrel (PLAVIX) 75 MG tablet TAKE 1 TABLET BY MOUTH EVERY DAY 30 tablet 0  . diphenhydrAMINE (BENADRYL) 12.5 MG/5ML elixir Take 12.5 mg by mouth 4 (four) times daily as needed. itching    . docusate sodium (COLACE) 100 MG capsule Take 100 mg by mouth 3 (three) times daily as needed (constipation).     . ferrous sulfate 325 (65 FE) MG tablet Take 325 mg by mouth daily.    Marland Kitchen lactose free nutrition (BOOST PLUS) LIQD Take 237 mLs by mouth daily after lunch daily after lunch.    . Multiple Vitamin (MULTIVITAMIN) capsule Take 1 capsule by mouth daily.    Marland Kitchen MYRBETRIQ 50 MG TB24 tablet Take 50 mg by mouth daily.  11  . NITROSTAT 0.4 MG SL tablet PLACE 1 TABLET UNDER THE TONGUE EVERY 5 MINUTES AS NEEDED FOR CHEST PAIN 25 tablet 1  . OVER THE COUNTER MEDICATION Take 1  Dose by mouth daily. Vit b-50    . OVER THE COUNTER MEDICATION Take 1 Dose by mouth daily. D-mannose    . pantoprazole (PROTONIX) 40 MG tablet Take 40 mg by mouth daily.    . polyethylene glycol (MIRALAX / GLYCOLAX) packet Take 17 g by mouth daily as needed. constipation    . pregabalin (LYRICA) 50 MG capsule Take 50 mg by mouth daily as needed (shingles pain).     . sodium chloride 1 G tablet Take 2 g by mouth 3 (three) times daily.     No current facility-administered medications for this visit.    Allergies:   Amitriptyline; Avelox; Carisoprodol; Citalopram hydrobromide; Crestor; Flexeril; Hydrocodone-acetaminophen; Lescol; Levaquin; Lipitor; Lovastatin; Macrobid; Novocain; Oxycodone; Pravachol; Procaine hcl; Promethazine hcl; Shellfish allergy; Wellbutrin; Zocor; and Zofran    Social History:  The patient  reports that she has never smoked. She does not have any smokeless tobacco history on file. She reports that she does not drink alcohol or use illicit drugs.   Family History:  The patient's family history includes Arthritis in her father; Breast cancer in her mother; Cancer (age of onset: 33) in her mother; Heart disease in her father; Ovarian cancer in her mother.    ROS:  Please see the history of present illness.   Otherwise, review of systems are positive for none.   All other systems are reviewed and negative.    PHYSICAL EXAM: VS:  BP 114/54 mmHg  Pulse 88  Ht 5' 1.5" (1.562 m)  Wt 105 lb 1.9 oz (47.682 kg)  BMI 19.54 kg/m2 , BMI Body mass index is 19.54 kg/(m^2). GEN: Well nourished, well developed, in no acute distress HEENT: normal Neck: no JVD, carotid bruits, or masses Cardiac: RRR; no murmurs, rubs, or gallops,no edema  Respiratory:  clear to auscultation bilaterally, normal work of breathing GI: soft, nontender, nondistended, + BS MS: no deformity or atrophy Skin: warm and dry, no rash Neuro:  Strength and sensation are intact Psych: euthymic mood, full  affect   EKG:  EKG is ordered today. The ekg ordered today demonstrates normal sinus rhythm.  Right axis deviation.  Since prior tracing of 10/01/13, left bundle branch block is new.   Recent Labs: No results found for requested labs within last 365 days.    Lipid Panel    Component Value Date/Time   CHOL 180 05/09/2011 1001   TRIG 82.0 05/09/2011 1001   HDL 51.30 05/09/2011 1001   CHOLHDL  4 05/09/2011 1001   VLDL 16.4 05/09/2011 1001   LDLCALC 112* 05/09/2011 1001      Wt Readings from Last 3 Encounters:  08/30/15 105 lb 1.9 oz (47.682 kg)  05/27/14 107 lb (48.535 kg)  10/01/13 114 lb (51.71 kg)         ASSESSMENT AND PLAN:  1. postprandial dizziness and weakness. The symptoms may be due to mild orthostatic hypotension. 2. ischemic heart disease status post CABG in 1996 3. Past history of trigeminal neuralgia postherpetic 4. History of hypercholesterolemia with intolerance to statins 5. History of hyponatremia   Current medicines are reviewed at length with the patient today.  The patient does not have concerns regarding medicines.  The following changes have been made:  no change  Labs/ tests ordered today include:  No orders of the defined types were placed in this encounter.     Disposition:   Her daughter accompanied her to the office today.  We had a long talk about her symptoms of possible exertional angina and her left bundle branch block.  We talked about the possibility of further ischemic workup.  This would involve a Lexiscan Myoview and possibly subsequent cardiac catheterization.  Given the fact that her symptoms are very predictable and mild and have not changed over the past 8 months, we elected to follow her at this point.  She will try to eat a smaller quantity of food at the evening meal and this may help.  She could also try taking a sublingual nitroglycerin at the end of the meal prior to walking back from the dining hall.  If symptoms worsen  she will contact us.  Overall she is doing so much better than she was the last time I saw her.  After my retirement she will be returning to see Dr. Elease HashimotoNahser for office visit and EKG in about 4 months Signed, Cassell Clementhomas Loma Dubuque MD 08/30/2015 1:14 PM    [facility] [address], [place], [place]  [address] Phone: [phone]; Fax: [phone]

## 2015-08-30 NOTE — Patient Instructions (Signed)
Medication Instructions:  Your physician recommends that you continue on your current medications as directed. Please refer to the Current Medication list given to you today.  Labwork: none  Testing/Procedures: none  Follow-Up: Your physician recommends that you schedule a follow-up appointment in: 4 month ov/ekg with Dr Elease HashimotoNahser   If you need a refill on your cardiac medications before your next appointment, please call your pharmacy.

## 2015-11-06 ENCOUNTER — Ambulatory Visit
Admission: RE | Admit: 2015-11-06 | Discharge: 2015-11-06 | Disposition: A | Discharge status: Home / Self Care | Payer: Medicare Other | Source: Ambulatory Visit | Attending: Internal Medicine | Admitting: Internal Medicine

## 2015-11-06 ENCOUNTER — Other Ambulatory Visit: Payer: Self-pay | Admitting: Internal Medicine

## 2015-11-06 DIAGNOSIS — R059 Cough, unspecified: Secondary | ICD-10-CM

## 2015-11-06 DIAGNOSIS — R05 Cough: Secondary | ICD-10-CM

## 2015-11-09 ENCOUNTER — Encounter (HOSPITAL_BASED_OUTPATIENT_CLINIC_OR_DEPARTMENT_OTHER): Payer: Self-pay | Admitting: *Deleted

## 2015-11-09 ENCOUNTER — Encounter: Payer: Self-pay | Admitting: Cardiology

## 2015-11-09 ENCOUNTER — Inpatient Hospital Stay (HOSPITAL_BASED_OUTPATIENT_CLINIC_OR_DEPARTMENT_OTHER)
Admission: EM | Admit: 2015-11-09 | Discharge: 2015-11-15 | DRG: 292 | Disposition: A | Discharge status: Home Health | Payer: Medicare Other | Attending: Internal Medicine | Admitting: Internal Medicine

## 2015-11-09 ENCOUNTER — Ambulatory Visit (INDEPENDENT_AMBULATORY_CARE_PROVIDER_SITE_OTHER): Payer: Medicare Other | Admitting: Cardiology

## 2015-11-09 ENCOUNTER — Emergency Department (HOSPITAL_BASED_OUTPATIENT_CLINIC_OR_DEPARTMENT_OTHER): Payer: Medicare Other

## 2015-11-09 VITALS — BP 110/58 | HR 96 | Ht 61.0 in | Wt 111.4 lb

## 2015-11-09 DIAGNOSIS — F419 Anxiety disorder, unspecified: Secondary | ICD-10-CM | POA: Diagnosis present

## 2015-11-09 DIAGNOSIS — I447 Left bundle-branch block, unspecified: Secondary | ICD-10-CM

## 2015-11-09 DIAGNOSIS — Z7902 Long term (current) use of antithrombotics/antiplatelets: Secondary | ICD-10-CM

## 2015-11-09 DIAGNOSIS — Z886 Allergy status to analgesic agent status: Secondary | ICD-10-CM

## 2015-11-09 DIAGNOSIS — I5020 Unspecified systolic (congestive) heart failure: Secondary | ICD-10-CM | POA: Diagnosis not present

## 2015-11-09 DIAGNOSIS — I110 Hypertensive heart disease with heart failure: Principal | ICD-10-CM | POA: Diagnosis present

## 2015-11-09 DIAGNOSIS — I509 Heart failure, unspecified: Secondary | ICD-10-CM | POA: Diagnosis not present

## 2015-11-09 DIAGNOSIS — E785 Hyperlipidemia, unspecified: Secondary | ICD-10-CM | POA: Diagnosis present

## 2015-11-09 DIAGNOSIS — I2510 Atherosclerotic heart disease of native coronary artery without angina pectoris: Secondary | ICD-10-CM | POA: Diagnosis present

## 2015-11-09 DIAGNOSIS — I25709 Atherosclerosis of coronary artery bypass graft(s), unspecified, with unspecified angina pectoris: Secondary | ICD-10-CM | POA: Diagnosis not present

## 2015-11-09 DIAGNOSIS — R0789 Other chest pain: Secondary | ICD-10-CM

## 2015-11-09 DIAGNOSIS — Z515 Encounter for palliative care: Secondary | ICD-10-CM | POA: Insufficient documentation

## 2015-11-09 DIAGNOSIS — Z955 Presence of coronary angioplasty implant and graft: Secondary | ICD-10-CM

## 2015-11-09 DIAGNOSIS — R05 Cough: Secondary | ICD-10-CM | POA: Diagnosis not present

## 2015-11-09 DIAGNOSIS — Z7982 Long term (current) use of aspirin: Secondary | ICD-10-CM

## 2015-11-09 DIAGNOSIS — I259 Chronic ischemic heart disease, unspecified: Secondary | ICD-10-CM | POA: Diagnosis not present

## 2015-11-09 DIAGNOSIS — I5021 Acute systolic (congestive) heart failure: Secondary | ICD-10-CM | POA: Diagnosis present

## 2015-11-09 DIAGNOSIS — Z951 Presence of aortocoronary bypass graft: Secondary | ICD-10-CM

## 2015-11-09 DIAGNOSIS — E7800 Pure hypercholesterolemia, unspecified: Secondary | ICD-10-CM | POA: Diagnosis present

## 2015-11-09 DIAGNOSIS — K580 Irritable bowel syndrome with diarrhea: Secondary | ICD-10-CM | POA: Diagnosis present

## 2015-11-09 DIAGNOSIS — M4854XA Collapsed vertebra, not elsewhere classified, thoracic region, initial encounter for fracture: Secondary | ICD-10-CM | POA: Diagnosis present

## 2015-11-09 DIAGNOSIS — G8921 Chronic pain due to trauma: Secondary | ICD-10-CM | POA: Diagnosis present

## 2015-11-09 DIAGNOSIS — R0602 Shortness of breath: Secondary | ICD-10-CM

## 2015-11-09 DIAGNOSIS — Z91013 Allergy to seafood: Secondary | ICD-10-CM

## 2015-11-09 DIAGNOSIS — Z8249 Family history of ischemic heart disease and other diseases of the circulatory system: Secondary | ICD-10-CM

## 2015-11-09 DIAGNOSIS — I248 Other forms of acute ischemic heart disease: Secondary | ICD-10-CM | POA: Diagnosis present

## 2015-11-09 DIAGNOSIS — R54 Age-related physical debility: Secondary | ICD-10-CM | POA: Diagnosis present

## 2015-11-09 DIAGNOSIS — Z881 Allergy status to other antibiotic agents status: Secondary | ICD-10-CM

## 2015-11-09 DIAGNOSIS — E222 Syndrome of inappropriate secretion of antidiuretic hormone: Secondary | ICD-10-CM

## 2015-11-09 DIAGNOSIS — Z8041 Family history of malignant neoplasm of ovary: Secondary | ICD-10-CM

## 2015-11-09 DIAGNOSIS — Z803 Family history of malignant neoplasm of breast: Secondary | ICD-10-CM

## 2015-11-09 DIAGNOSIS — I959 Hypotension, unspecified: Secondary | ICD-10-CM | POA: Clinically undetermined

## 2015-11-09 DIAGNOSIS — Z888 Allergy status to other drugs, medicaments and biological substances status: Secondary | ICD-10-CM

## 2015-11-09 DIAGNOSIS — Z885 Allergy status to narcotic agent status: Secondary | ICD-10-CM

## 2015-11-09 DIAGNOSIS — E871 Hypo-osmolality and hyponatremia: Secondary | ICD-10-CM | POA: Diagnosis present

## 2015-11-09 DIAGNOSIS — Z66 Do not resuscitate: Secondary | ICD-10-CM | POA: Diagnosis not present

## 2015-11-09 DIAGNOSIS — R059 Cough, unspecified: Secondary | ICD-10-CM | POA: Insufficient documentation

## 2015-11-09 DIAGNOSIS — E878 Other disorders of electrolyte and fluid balance, not elsewhere classified: Secondary | ICD-10-CM | POA: Diagnosis present

## 2015-11-09 HISTORY — DX: Atherosclerotic heart disease of native coronary artery without angina pectoris: I25.10

## 2015-11-09 LAB — CBC WITH DIFFERENTIAL/PLATELET
Basophils Absolute: 0 10*3/uL (ref 0.0–0.1)
Basophils Relative: 0 %
EOS PCT: 2 %
Eosinophils Absolute: 0.2 10*3/uL (ref 0.0–0.7)
HEMATOCRIT: 38.9 % (ref 36.0–46.0)
Hemoglobin: 13.1 g/dL (ref 12.0–15.0)
LYMPHS PCT: 15 %
Lymphs Abs: 1.4 10*3/uL (ref 0.7–4.0)
MCH: 31.7 pg (ref 26.0–34.0)
MCHC: 33.7 g/dL (ref 30.0–36.0)
MCV: 94.2 fL (ref 78.0–100.0)
MONO ABS: 1 10*3/uL (ref 0.1–1.0)
MONOS PCT: 10 %
NEUTROS ABS: 6.8 10*3/uL (ref 1.7–7.7)
Neutrophils Relative %: 73 %
Platelets: 217 10*3/uL (ref 150–400)
RBC: 4.13 MIL/uL (ref 3.87–5.11)
RDW: 13 % (ref 11.5–15.5)
WBC: 9.4 10*3/uL (ref 4.0–10.5)

## 2015-11-09 LAB — BASIC METABOLIC PANEL
Anion gap: 10 (ref 5–15)
BUN: 18 mg/dL (ref 6–20)
CALCIUM: 8.8 mg/dL — AB (ref 8.9–10.3)
CHLORIDE: 100 mmol/L — AB (ref 101–111)
CO2: 22 mmol/L (ref 22–32)
CREATININE: 0.56 mg/dL (ref 0.44–1.00)
GFR calc non Af Amer: >60 mL/min (ref >60)
Glucose, Bld: 154 mg/dL — ABNORMAL HIGH (ref 65–99)
Potassium: 4.4 mmol/L (ref 3.5–5.1)
SODIUM: 132 mmol/L — AB (ref 135–145)

## 2015-11-09 LAB — URINALYSIS, ROUTINE W REFLEX MICROSCOPIC
BILIRUBIN URINE: NEGATIVE
GLUCOSE, UA: NEGATIVE mg/dL
HGB URINE DIPSTICK: NEGATIVE
KETONES UR: NEGATIVE mg/dL
Leukocytes, UA: NEGATIVE
Nitrite: NEGATIVE
PH: 5.5 (ref 5.0–8.0)
Protein, ur: NEGATIVE mg/dL
Specific Gravity, Urine: 1.018 (ref 1.005–1.030)

## 2015-11-09 LAB — TROPONIN I: Troponin I: 0.04 ng/mL — ABNORMAL HIGH (ref <0.031)

## 2015-11-09 LAB — BRAIN NATRIURETIC PEPTIDE: B Natriuretic Peptide: 1231.2 pg/mL — ABNORMAL HIGH (ref 0.0–100.0)

## 2015-11-09 MED ORDER — FERROUS SULFATE 325 (65 FE) MG PO TABS
325.0000 mg | ORAL_TABLET | Freq: Every day | ORAL | Status: DC
  Administered 2015-11-10 – 2015-11-15 (×6): 325 mg via ORAL
  Filled 2015-11-09 (×6): qty 1

## 2015-11-09 MED ORDER — SODIUM CHLORIDE 0.9% FLUSH
3.0000 mL | Freq: Two times a day (BID) | INTRAVENOUS | Status: DC
  Administered 2015-11-10 – 2015-11-14 (×9): 3 mL via INTRAVENOUS

## 2015-11-09 MED ORDER — CLOPIDOGREL BISULFATE 75 MG PO TABS
75.0000 mg | ORAL_TABLET | Freq: Every day | ORAL | Status: DC
  Administered 2015-11-10 – 2015-11-15 (×6): 75 mg via ORAL
  Filled 2015-11-09 (×6): qty 1

## 2015-11-09 MED ORDER — SODIUM CHLORIDE 0.9% FLUSH: 3.0000 mL | INTRAVENOUS | Status: DC | PRN

## 2015-11-09 MED ORDER — BISMUTH SUBSALICYLATE 262 MG PO CHEW: 524.0000 mg | CHEWABLE_TABLET | ORAL | Status: DC | PRN

## 2015-11-09 MED ORDER — MULTIVITAMINS PO CAPS: 1.0000 | ORAL_CAPSULE | Freq: Every day | ORAL | Status: DC

## 2015-11-09 MED ORDER — BENZONATATE 100 MG PO CAPS
200.0000 mg | ORAL_CAPSULE | Freq: Three times a day (TID) | ORAL | Status: DC | PRN
  Administered 2015-11-10 (×3): 200 mg via ORAL
  Filled 2015-11-09 (×3): qty 2

## 2015-11-09 MED ORDER — PANTOPRAZOLE SODIUM 40 MG PO TBEC
40.0000 mg | DELAYED_RELEASE_TABLET | Freq: Every day | ORAL | Status: DC
  Administered 2015-11-10 – 2015-11-15 (×6): 40 mg via ORAL
  Filled 2015-11-09 (×7): qty 1

## 2015-11-09 MED ORDER — ADULT MULTIVITAMIN W/MINERALS CH
1.0000 | ORAL_TABLET | Freq: Every day | ORAL | Status: DC
  Administered 2015-11-10 – 2015-11-15 (×6): 1 via ORAL
  Filled 2015-11-09 (×6): qty 1

## 2015-11-09 MED ORDER — FUROSEMIDE 10 MG/ML IJ SOLN
20.0000 mg | Freq: Once | INTRAMUSCULAR | Status: AC
  Administered 2015-11-09: 20 mg via INTRAVENOUS
  Filled 2015-11-09: qty 2

## 2015-11-09 MED ORDER — ACETAMINOPHEN 325 MG PO TABS
650.0000 mg | ORAL_TABLET | ORAL | Status: DC | PRN
  Administered 2015-11-10: 650 mg via ORAL
  Filled 2015-11-09: qty 2

## 2015-11-09 MED ORDER — ACETAMINOPHEN 500 MG PO TABS: 500.0000 mg | ORAL_TABLET | Freq: Four times a day (QID) | ORAL | Status: DC | PRN

## 2015-11-09 MED ORDER — ONDANSETRON HCL 4 MG/2ML IJ SOLN: 4.0000 mg | Freq: Four times a day (QID) | INTRAMUSCULAR | Status: DC | PRN

## 2015-11-09 MED ORDER — DIPHENHYDRAMINE HCL 12.5 MG/5ML PO ELIX: 12.5000 mg | ORAL_SOLUTION | Freq: Four times a day (QID) | ORAL | Status: DC | PRN

## 2015-11-09 MED ORDER — CHOLECALCIFEROL 10 MCG (400 UNIT) PO TABS
400.0000 [IU] | ORAL_TABLET | Freq: Two times a day (BID) | ORAL | Status: DC
  Administered 2015-11-10 – 2015-11-15 (×11): 400 [IU] via ORAL
  Filled 2015-11-09 (×14): qty 1

## 2015-11-09 MED ORDER — ALPRAZOLAM 0.25 MG PO TABS
0.1250 mg | ORAL_TABLET | Freq: Three times a day (TID) | ORAL | Status: DC | PRN
  Administered 2015-11-10 – 2015-11-15 (×9): 0.125 mg via ORAL
  Filled 2015-11-09 (×9): qty 1

## 2015-11-09 MED ORDER — DOCUSATE SODIUM 100 MG PO CAPS
100.0000 mg | ORAL_CAPSULE | Freq: Three times a day (TID) | ORAL | Status: DC | PRN
  Administered 2015-11-13 – 2015-11-14 (×3): 100 mg via ORAL
  Filled 2015-11-09 (×3): qty 1

## 2015-11-09 MED ORDER — ENOXAPARIN SODIUM 40 MG/0.4ML ~~LOC~~ SOLN
40.0000 mg | SUBCUTANEOUS | Status: DC
  Administered 2015-11-10 – 2015-11-14 (×5): 40 mg via SUBCUTANEOUS
  Filled 2015-11-09 (×5): qty 0.4

## 2015-11-09 MED ORDER — SODIUM CHLORIDE 0.9 % IV SOLN: 250.0000 mL | INTRAVENOUS | Status: DC | PRN

## 2015-11-09 MED ORDER — ASPIRIN 81 MG PO TABS: 81.0000 mg | ORAL_TABLET | Freq: Every day | ORAL | Status: DC

## 2015-11-09 MED ORDER — ASPIRIN EC 81 MG PO TBEC
81.0000 mg | DELAYED_RELEASE_TABLET | Freq: Every day | ORAL | Status: DC
  Administered 2015-11-10 – 2015-11-15 (×6): 81 mg via ORAL
  Filled 2015-11-09 (×6): qty 1

## 2015-11-09 MED ORDER — BOOST PLUS PO LIQD
237.0000 mL | Freq: Every day | ORAL | Status: DC
  Filled 2015-11-09 (×7): qty 237

## 2015-11-09 MED ORDER — MIRABEGRON ER 50 MG PO TB24
50.0000 mg | ORAL_TABLET | Freq: Every day | ORAL | Status: DC
  Administered 2015-11-10 – 2015-11-15 (×6): 50 mg via ORAL
  Filled 2015-11-09 (×7): qty 1

## 2015-11-09 NOTE — Progress Notes (Signed)
Cardiology Office Note   Date:  11/09/2015   ID:  [name], DOB Aug 19, 1927, MRN [id number]  PCP:  Pearla Dubonnet, MD  Cardiologist: Cassell Clement MD  Chief Complaint  Patient presents with  . Chest Pain    Patient c/o shortness of breath, shoulder/chest pain, pain across back and right arm, Denies le edema or claudication      History of Present Illness: Sabrina Moore is a 82 y.o. female who presents for  Work in office visit  This patient presents because of a severe persistent intractable cough. He saw her primary care physician 3 days ago. Chest x-ray on 1/23/ 17 was Sharma Covert did not show any evidence of congestive heart failure or pneumonia. The patient has been taking Tessalon Perles 200 mg 3 times a day with no improvement.  She has been on amoxicillin since 3 days with no improvement.  She is unable to lie flat. She is unable to get a deep breath because of a catch in her chest.  She has also been taking a lot of sublingual nitroglycerin because of chest pain and left arm pain.  She does not know whether this is secondary to coughing are normal. She has not been eating much for the past several days but her weight is up several pounds since being seen elsewhere earlier this week  She has a past history of ischemic heart disease with prior coronary artery bypass graft surgery in 1996. In April 2011 she had an abnormal Cardiolite stress test followed by subsequent successful PCI with stenting of the saphenous vein graft to the diagonal and successful stenting of the saphenous vein graft to the intermediate. . She has been a widow since last January 2014. Since we last saw her she has moved to [place]. She has a history of hyponatremia. She saw Dr. Briant Cedar who limited her total fluid intake and had her continue with salt tablets and with that change in her regimen her serum sodium is back to normal and she feels a lot better. She has had for the past 6 or 8 months  occasional bilateral arm aching when she walks back to her apartment after the supper meal. This is the only time she has these symptoms. She has not tried taking any sublingual nitroglycerin. Her EKG today shows a left bundle branch block which was not present in 2014. It was present on her last scheduled office visit 08/30/15. t.  Past Medical History  Diagnosis Date  . IHD (ischemic heart disease)     Stents placed in 2012?0-on plavix and asa   . Chest discomfort   . Abnormal stress ECG with treadmill 02/01/10  . Dyslipidemia     Intolerant to statins  . [facility]  . IDA (iron deficiency anemia)     NO scopes recently? was controverial  . IBS (irritable bowel syndrome)     Seen by Dr. [name] Jan 2013-advised not to do this  . Fatigue   . Anxiety   . Hypertension   . Hypercholesterolemia   . Pneumonia   . Angina   . Shingles     End of august 2012-post-herpeticv Neuralgia    Past Surgical History  Procedure Laterality Date  . Coronary artery bypass graft  1996  . Cardiac catheterization  02/09/10  . Appendectomy  1967  . Tonsillectomy  1933  . Oophorectomy  1967  . Eye surgery  2009  catarac  . Squamous cell carcinoma excision  2012    from nose      Current Outpatient Prescriptions  Medication Sig Dispense Refill  . acetaminophen (TYLENOL) 500 MG tablet Take 1 tablet (500 mg total) by mouth every 6 (six) hours as needed. Mild pain.    Marland Kitchen ALPRAZolam (XANAX) 0.25 MG tablet Take 0.125 mg by mouth. Takes 0.5 tablet by mouth three times a day and a whole tablet by mouth at night    . amoxicillin (AMOXIL) 500 MG capsule Take 500 mg by mouth 2 (two) times daily.    Marland Kitchen aspirin 81 MG tablet Take 81 mg by mouth daily.      . benzonatate (TESSALON) 200 MG capsule Take 200 mg by mouth 3 (three) times daily as needed for cough.    . bismuth subsalicylate (PEPTO BISMOL) 262 MG chewable tablet Chew 524 mg by mouth as needed (indigestion).     .  carisoprodol (SOMA) 250 MG tablet Take 225 mg by mouth as needed (muscle pain).     . Cholecalciferol (VITAMIN D PO) Take 400 Units by mouth 2 (two) times daily.    . clopidogrel (PLAVIX) 75 MG tablet TAKE 1 TABLET BY MOUTH EVERY DAY 30 tablet 0  . diphenhydrAMINE (BENADRYL) 12.5 MG/5ML elixir Take 12.5 mg by mouth 4 (four) times daily as needed. itching    . docusate sodium (COLACE) 100 MG capsule Take 100 mg by mouth 3 (three) times daily as needed (constipation).     . ferrous sulfate 325 (65 FE) MG tablet Take 325 mg by mouth daily.    . furosemide (LASIX) 20 MG tablet Take 10 mg by mouth daily.  6  . lactose free nutrition (BOOST PLUS) LIQD Take 237 mLs by mouth daily after lunch daily after lunch.    . Multiple Vitamin (MULTIVITAMIN) capsule Take 1 capsule by mouth daily.    Marland Kitchen MYRBETRIQ 50 MG TB24 tablet Take 50 mg by mouth daily.  11  . NITROSTAT 0.4 MG SL tablet PLACE 1 TABLET UNDER THE TONGUE EVERY 5 MINUTES AS NEEDED FOR CHEST PAIN 25 tablet 1  . OVER THE COUNTER MEDICATION Take 1 Dose by mouth daily. Vit b-50    . OVER THE COUNTER MEDICATION Take 1 Dose by mouth daily. D-mannose    . pantoprazole (PROTONIX) 40 MG tablet Take 40 mg by mouth daily.    . polyethylene glycol (MIRALAX / GLYCOLAX) packet Take 17 g by mouth daily as needed. constipation    . pregabalin (LYRICA) 50 MG capsule Take 50 mg by mouth daily as needed (shingles pain).     . sodium chloride 1 G tablet Take 2 g by mouth 3 (three) times daily.     No current facility-administered medications for this visit.    Allergies:   Amitriptyline; Avelox; Carisoprodol; Citalopram hydrobromide; Crestor; Flexeril; Hydrocodone-acetaminophen; Lescol; Levaquin; Lipitor; Lovastatin; Macrobid; Novocain; Oxycodone; Pravachol; Procaine hcl; Promethazine hcl; Shellfish allergy; Wellbutrin; Zocor; and Zofran    Social History:  The patient  reports that she has never smoked. She does not have any smokeless tobacco history on file. She  reports that she does not drink alcohol or use illicit drugs.   Family History:  The patient's family history includes Arthritis in her father; Breast cancer in her mother; Cancer (age of onset: 62) in her mother; Heart disease in her father; Ovarian cancer in her mother.    ROS:  Please see the history of present illness.   Otherwise, review of  systems are positive for none.   All other systems are reviewed and negative.    PHYSICAL EXAM: VS:  BP 110/58 mmHg  Pulse 96  Ht  (1.549 m)  Wt 111 lb 6.4 oz (50.531 kg)  BMI 21.06 kg/m2  SpO2 94% , BMI Body mass index is 21.06 kg/(m^2). GEN: Well nourished, well developed,  Patient has an intractable dry nonproductive cough. No fever. No sputum production to speak of.  No hemoptysis. HEENT: normal Neck: no JVD, carotid bruits, or masses Cardiac: RRR; no murmurs, rubs, or gallops,no edema  Respiratory:   She has inspiratory rales bilaterally. GI: soft, nontender, nondistended, + BS MS: no deformity or atrophy Skin: warm and dry, no rash Neuro:  Strength and sensation are intact Psych: euthymic mood, full affect   EKG:  EKG is ordered today. The ekg ordered today demonstrates  Normal sinus rhythm at 95 bpm. Left bundle-branch block with left axis deviation.   Recent Labs: 11/09/2015: BUN 18; Creatinine, Ser 0.56; Hemoglobin 13.1; Platelets 217; Potassium 4.4; Sodium 132*    Lipid Panel    Component Value Date/Time   CHOL 180 05/09/2011 1001   TRIG 82.0 05/09/2011 1001   HDL 51.30 05/09/2011 1001   CHOLHDL 4 05/09/2011 1001   VLDL 16.4 05/09/2011 1001   LDLCALC 112* 05/09/2011 1001      Wt Readings from Last 3 Encounters:  11/09/15 104 lb (47.174 kg)  11/09/15 111 lb 6.4 oz (50.531 kg)  08/30/15 105 lb 1.9 oz (47.682 kg)       ASSESSMENT AND PLAN:  1.  Severe intractable unrelenting dry cough. Severe shortness of breath uncertain etiology.  Chest x-ray 3 days ago was normal. 2. ischemic heart disease status post  CABG in 1996 3. Past history of trigeminal neuralgia postherpetic 4. History of hypercholesterolemia with intolerance to statins 5. History of hyponatremia   Current medicines are reviewed at length with the patient today.  The patient does not have concerns regarding medicines.  The following changes have been made:  no change  Labs/ tests ordered today include:   Orders Placed This Encounter  Procedures  . EKG 12-Lead     Disposition:    The patient's shortness of breath and cough have worsened since she was seen by her PCP 3 days ago. Examination reveals bibasilar rales. Differential diagnosis would include pneumonia or worsening CHF. We will need further workup.  We are advising her daughter to take her to the emergency room. He is also having chest discomfort unknown etiology may be secondary to coughing or may be secondary to her known ischemic heart disease.  Karie Schwalbe MD 11/09/2015 6:30 PM    [facility] [address] [place], [place], [place]  [address] Phone: [phone]; Fax: [phone]

## 2015-11-09 NOTE — ED Notes (Addendum)
Report given to Chris,RN at this time, by Jackie Plum

## 2015-11-09 NOTE — ED Notes (Signed)
Chest pain for almost a week. She has been taking Nitro SL. She saw Dr Achilles Dunk office. She had a negative CXR on Monday. She is more sob since the CXR. Unable to lay down to sleep per daughter.

## 2015-11-09 NOTE — ED Notes (Signed)
Patient returns from radiology department.

## 2015-11-09 NOTE — Progress Notes (Signed)
Patient admitted from [facility] via [facility]. Placed patient on bedside monitor and obtained vital signs while completing intake assessment with patient and family. Stable vital signs on transfer with mild DOE, borderline tachycardia and increased respiratory rate, otherwise WDL. Crackles heard in both lung bases on auscultation. No adventitious heart sounds; WDL.  Vital signs on admission: Filed Vitals:   11/09/15 1833 11/09/15 1958 11/09/15 2137 11/09/15 2254  BP: 115/78 115/71 112/73 123/71  Pulse: 94 108 95 100  Temp:    98.4 F (36.9 C)  TempSrc:    Oral  Resp: Height:     (1.575 m)  Weight:    50.395 kg (111 lb 1.6 oz)  SpO2: 92% 96% 94% 93%   Patient states her weight is normally around 100 lbs, based on this alone, her weight is up around 10%. She has been recently treated by [name] for a URI with amoxicillin; 3 days of 5-day course completed. Since seeing [name], her DOE has gotten progressively worse and has most recently been associated with chest discomfort. She has been sporadically taking SL NTG for this with some relief. Family stated that she follows her daily fluid restrictions and takes her medications as prescribed. She has not taken a flu shot this year, related to previous adverse reaction. She is current on her pneumonia vaccination.  Notified Dr. Tenny Craw of patient's arrival to floor.  Continuing to monitor closely.

## 2015-11-09 NOTE — Patient Instructions (Signed)
YOU HAVE BEEN REFFERED TO [facility] [place]

## 2015-11-09 NOTE — ED Notes (Signed)
Patient taken to restroom for urine sample, patient unable to void at this time, will recheck shortly.

## 2015-11-09 NOTE — H&P (Signed)
Primary Physician:  Jerelyn Scott   Primary Cardiologist:  [name] REnal:   Pt presents to [facility] ER for CP/SOB    HPI: Pt is a 82 yo who was seen in clinic today . Hx of CAD (s/p CABG in 1996  In 2011 had PCI/stent to SVG to diag; PCI / stent to SVG to intermedius)   Hx of severe, persistent cough for several days    Seen by primary MD 3 days ago   Taking tessalon perles  No improvement  Rx with amoxil on Monday No improvement.  Pt can't lie flat.  Taking SL NTG because of R shoulder/arm pain, bilateral chest pain.  Pain has been across chest   PO intake down. Today seen in Cardiology Clinic by T Brackbill  Pt had bibasilar rales Told to go to ER to evaluate / Rx further.   IN [facility] ER chest tight but not in pain.  SOB  Orthopnea. Did had diarrhea since 3 AM  Took immodium  None since   Labs showed Troponin 0.04  BNP 1231  CXR with bilateral interstitial edema and bilateral effusion  Lasix 20 given IV in ER Tx to [facility]   Pt still with cough  No CP    Note patient followed in IM and Renal clinc as well (not clear who sees)  FOr past 1 to 1 1/2 years she hs been takig 6 g NA per day  Without it she feels bad  BP down  NA down.       Past Medical History  Diagnosis Date  . IHD (ischemic heart disease)     Stents placed in 2012?0-on plavix and asa   . Chest discomfort   . Abnormal stress ECG with treadmill 02/01/10  . Dyslipidemia     Intolerant to statins  . [facility]agal [facility]ope     [facility]  . IDA (iron deficiency anemia)     NO scopes recently? was controverial  . IBS (irritable bowel syndrome)     Seen by Dr. [name] Jan 2013-advised not to do this  . Fatigue   . Anxiety   . Hypertension   . Hypercholesterolemia   . Pneumonia   . Angina   . Shingles     End of august 2012-post-herpeticv Neuralgia    Medications Prior to Admission  Medication Sig Dispense Refill  . acetaminophen (TYLENOL) 500 MG tablet Take 1 tablet (500 mg total) by mouth every 6 (six) hours as  needed. Mild pain.    Marland Kitchen ALPRAZolam (XANAX) 0.25 MG tablet Take 0.125 mg by mouth. Takes 0.5 tablet by mouth three times a day and a whole tablet by mouth at night    . amoxicillin (AMOXIL) 500 MG capsule Take 500 mg by mouth 2 (two) times daily.    Marland Kitchen aspirin 81 MG tablet Take 81 mg by mouth daily.      . benzonatate (TESSALON) 200 MG capsule Take 200 mg by mouth 3 (three) times daily as needed for cough.    . bismuth subsalicylate (PEPTO BISMOL) 262 MG chewable tablet Chew 524 mg by mouth as needed (indigestion).     . carisoprodol (SOMA) 250 MG tablet Take 225 mg by mouth as needed (muscle pain).     . Cholecalciferol (VITAMIN D PO) Take 400 Units by mouth 2 (two) times daily.    . clopidogrel (PLAVIX) 75 MG tablet TAKE 1 TABLET BY MOUTH EVERY DAY 30 tablet 0  . diphenhydrAMINE (BENADRYL) 12.5 MG/5ML  elixir Take 12.5 mg by mouth 4 (four) times daily as needed. itching    . docusate sodium (COLACE) 100 MG capsule Take 100 mg by mouth 3 (three) times daily as needed (constipation).     . ferrous sulfate 325 (65 FE) MG tablet Take 325 mg by mouth daily.    . furosemide (LASIX) 20 MG tablet Take 10 mg by mouth daily.  6  . lactose free nutrition (BOOST PLUS) LIQD Take 237 mLs by mouth daily after lunch daily after lunch.    . Multiple Vitamin (MULTIVITAMIN) capsule Take 1 capsule by mouth daily.    Marland Kitchen MYRBETRIQ 50 MG TB24 tablet Take 50 mg by mouth daily.  11  . NITROSTAT 0.4 MG SL tablet PLACE 1 TABLET UNDER THE TONGUE EVERY 5 MINUTES AS NEEDED FOR CHEST PAIN 25 tablet 1  . OVER THE COUNTER MEDICATION Take 1 Dose by mouth daily. Vit b-50    . OVER THE COUNTER MEDICATION Take 1 Dose by mouth daily. D-mannose    . pantoprazole (PROTONIX) 40 MG tablet Take 40 mg by mouth daily.    . polyethylene glycol (MIRALAX / GLYCOLAX) packet Take 17 g by mouth daily as needed. constipation    . sodium chloride 1 G tablet Take 2 g by mouth 3 (three) times daily.         Infusions:   Allergies  Allergen  Reactions  . Amitriptyline Other (See Comments)    delusions  . Avelox [Moxifloxacin Hcl In Nacl] Nausea And Vomiting  . Carisoprodol Other (See Comments)    Hallucinations. Can take a small dose   . Citalopram Hydrobromide Other (See Comments)    Increased sodium levels  . Crestor [Rosuvastatin Calcium] Other (See Comments)    Myalgias   . Flexeril [Cyclobenzaprine] Other (See Comments)    Extreme drowsiness   . Hydrocodone-Acetaminophen Nausea And Vomiting  . Lescol     Gi symptoms  . Levaquin [Levofloxacin] Diarrhea  . Lipitor [Atorvastatin Calcium]     Myalgias   . Lovastatin     Myalgias   . Macrobid [Nitrofurantoin Monohyd Macro]     Diarrhea   . Novocain [Procaine]     syncope  . Oxycodone Nausea And Vomiting  . Pravachol     Myalgias   . Procaine Hcl     Syncope   . Promethazine Hcl     No good  . Shellfish Allergy Hives  . Wellbutrin [Bupropion]     paranoia   . Zocor [Simvastatin]     Myalgias   . Zofran [Ondansetron]     abdominal cramps    Social History   Social History  . Marital Status: Widowed    Spouse Name: N/A  . Number of Children: N/A  . Years of Education: N/A   Occupational History  . Not on file.   Social History Main Topics  . Smoking status: Never Smoker   . Smokeless tobacco: Not on file  . Alcohol Use: No  . Drug Use: No  . Sexual Activity: Not on file   Other Topics Concern  . Not on file   Social History Narrative   [employer]ks for a bit.   Is from [place]- came [place] [address] to this [place]      [place] Mam (c) [phone]    Family History  Problem Relation Age of Onset  . Breast cancer Mother   . Ovarian cancer Mother   . Cancer Mother 6    ovarian  .  Heart disease Father     44 at age of death  . Arthritis Father     REVIEW OF SYSTEMS:  All systems reviewed  Negative to the above problem except as noted above.    PHYSICAL EXAM: Filed Vitals:   11/09/15 2137 11/09/15 2254  BP: 112/73 123/71    Pulse: 95 100  Temp:  98.4 F (36.9 C)  Resp: 20 27    No intake or output data in the 24 hours ending 11/09/15 2328  General:  Pt in mild distress coughing  HEENT: normal Neck: supple. JVP is increased   Carotids 2+ bilat; no bruits. No lymphadenopathy or thryomegaly appreciated. Cor: PMI nondisplaced. Regular rate & rhythm. No rubs, gallops or murmurs. Lungs: Rales at bases   Abdomen: soft, nontender, nondistended. No hepatosplenomegaly. No bruits or masses. Good bowel sounds. Extremities: no cyanosis, clubbing, rash, edema Neuro: alert & oriented x 3, cranial nerves grossly intact. moves all 4 extremities w/o difficulty.    ECG:  ST 101 bpm  LBBB  Results for orders placed or performed during the hospital encounter of 11/09/15 (from the past 24 hour(s))  Basic metabolic panel     Status: Abnormal   Collection Time: 11/09/15  4:09 PM  Result Value Ref Range   Sodium 132 (L) 135 - 145 mmol/L   Potassium 4.4 3.5 - 5.1 mmol/L   Chloride 100 (L) 101 - 111 mmol/L   CO2 22 22 - 32 mmol/L   Glucose, Bld 154 (H) 65 - 99 mg/dL   BUN 18 6 - 20 mg/dL   Creatinine, Ser 8.52 0.44 - 1.00 mg/dL   Calcium 8.8 (L) 8.9 - 10.3 mg/dL   GFR calc non Af Amer >60 >60 mL/min   GFR calc Af Amer >60 >60 mL/min   Anion gap 10 5 - 15  CBC with Differential     Status: None   Collection Time: 11/09/15  4:09 PM  Result Value Ref Range   WBC 9.4 4.0 - 10.5 K/uL   RBC 4.13 3.87 - 5.11 MIL/uL   Hemoglobin 13.1 12.0 - 15.0 g/dL   HCT 77.8 24.2 - 35.3 %   MCV 94.2 78.0 - 100.0 fL   MCH 31.7 26.0 - 34.0 pg   MCHC 33.7 30.0 - 36.0 g/dL   RDW 61.4 43.1 - 54.0 %   Platelets 217 150 - 400 K/uL   Neutrophils Relative % 73 %   Neutro Abs 6.8 1.7 - 7.7 K/uL   Lymphocytes Relative 15 %   Lymphs Abs 1.4 0.7 - 4.0 K/uL   Monocytes Relative 10 %   Monocytes Absolute 1.0 0.1 - 1.0 K/uL   Eosinophils Relative 2 %   Eosinophils Absolute 0.2 0.0 - 0.7 K/uL   Basophils Relative 0 %   Basophils Absolute 0.0  0.0 - 0.1 K/uL  Troponin I     Status: Abnormal   Collection Time: 11/09/15  4:09 PM  Result Value Ref Range   Troponin I 0.04 (H) <0.031 ng/mL  Brain natriuretic peptide     Status: Abnormal   Collection Time: 11/09/15  4:09 PM  Result Value Ref Range   B Natriuretic Peptide 1231.2 (H) 0.0 - 100.0 pg/mL  Urinalysis, Routine w reflex microscopic     Status: None   Collection Time: 11/09/15  5:45 PM  Result Value Ref Range   Color, Urine YELLOW YELLOW   APPearance CLEAR CLEAR   Specific Gravity, Urine 1.018 1.005 - 1.030   pH 5.5  5.0 - 8.0   Glucose, UA NEGATIVE NEGATIVE mg/dL   Hgb urine dipstick NEGATIVE NEGATIVE   Bilirubin Urine NEGATIVE NEGATIVE   Ketones, ur NEGATIVE NEGATIVE mg/dL   Protein, ur NEGATIVE NEGATIVE mg/dL   Nitrite NEGATIVE NEGATIVE   Leukocytes, UA NEGATIVE NEGATIVE   Dg Chest 2 View  11/09/2015  CLINICAL DATA:  82 year old presenting with 1 week history of chest pain, cough, and chest congestion. Current history of hypertension and ischemic heart disease. Prior CABG. EXAM: CHEST  2 VIEW COMPARISON:  11/06/2015 and earlier. FINDINGS: Sternotomy for CABG. Cardiac silhouette moderately enlarged. Interval development of interstitial pulmonary edema since the examination 3 days ago. Biapical pleuroparenchymal scarring, right greater than left. New small bilateral pleural effusions. Mild compression fracture of the upper endplate of T10 with slight kyphous deformity, unchanged. IMPRESSION: Mild CHF, with moderate cardiomegaly and mild diffuse interstitial pulmonary edema associated with small bilateral pleural effusions. Electronically Signed   By: Hulan Saas M.D.   On: 11/09/2015 16:44     ASSESSMENT: 82 yo who was seen earlier today with chest pain, SOB and cough Found to be in CHF  Went to [facility] ER and Tx to [facility]  Currently still with cough  Rales on exam  1  CHF Plan:  Admit.  Give additional lasix 40 IV  STrict I/O Echo in AM to eval LV function Na restrict   Need to get records from renal service re Na supplements.  2  CP  Concerning  Currently more comfortable  SOme may be related to persistent cough  SOme to CHF  Follow  Check Troponin  Check echo  3.  HTN  Follow   4.  ID  I am not convinced of active infection  With diarrhea earlier today wlll hold ABX in AM  Diurese  Follow exam , labs.

## 2015-11-09 NOTE — ED Provider Notes (Signed)
CSN: [id number]     Arrival date & time 11/09/15  1604 History   First MD Initiated Contact with Patient 11/09/15 1614     Chief Complaint  Patient presents with  . Chest Pain    HPI   Sabrina Moore is an 82 y.o. female with history of CAD/ischemic heart disease, HLD, chronic hyponatremia, who presents to the ED from [name] (cardiology) office for evaluation of cough, bibasilar crackles, and chest pain. Pt reports she developed URI symptoms last week. She saw her PCP three days ago with negative chest x-ray, but was prescribed amoxicillin and tessalon perles. She states her cough has persisted. She describes the pain as persistent, hacking cough that is mostly dry but at times productive of clear sputum. She states she has had chest pain for the past several days that she is unsure if is associated with her cough or her heart. She states the pain spreads from her right chest to her left chest and radiates down her right arm. She states she has been taking "a lot" of nitro as it helps with the pain. Last dose of nitro 0530 AM this morning. Denies pain in the ED currently but states her chest is tight like she cannot catch her breath. She does endorse SOB and orthopnea which is not baseline for her. Denies new swelling. Denies diaphoresis. Endorses intermittent nausea but denies emesis. Reports several episodes of watery diarrhea today that started at 3 AM.   Past Medical History  Diagnosis Date  . IHD (ischemic heart disease)     Stents placed in 2012?0-on plavix and asa   . Chest discomfort   . Abnormal stress ECG with treadmill 02/01/10  . Dyslipidemia     Intolerant to statins  . [facility]     [facility]  . IDA (iron deficiency anemia)     NO scopes recently? was controverial  . IBS (irritable bowel syndrome)     Seen by Dr. [name] Jan 2013-advised not to do this  . Fatigue   . Anxiety   . Hypertension   . Hypercholesterolemia   . Pneumonia   . Angina   . Shingles      End of august 2012-post-herpeticv Neuralgia   Past Surgical History  Procedure Laterality Date  . Coronary artery bypass graft  1996  . Cardiac catheterization  02/09/10  . Appendectomy  1967  . Tonsillectomy  1933  . Oophorectomy  1967  . Eye surgery  2009    catarac  . Squamous cell carcinoma excision  2012    from nose    Family History  Problem Relation Age of Onset  . Breast cancer Mother   . Ovarian cancer Mother   . Cancer Mother 15    ovarian  . Heart disease Father     57 at age of death  . Arthritis Father    Social History  Substance Use Topics  . Smoking status: Never Smoker   . Smokeless tobacco: None  . Alcohol Use: No   OB History    No data available     Review of Systems  All other systems reviewed and are negative.     Allergies  Amitriptyline; Avelox; Carisoprodol; Citalopram hydrobromide; Crestor; Flexeril; Hydrocodone-acetaminophen; Lescol; Levaquin; Lipitor; Lovastatin; Macrobid; Novocain; Oxycodone; Pravachol; Procaine hcl; Promethazine hcl; Shellfish allergy; Wellbutrin; Zocor; and Zofran  Home Medications   Prior to Admission medications   Medication Sig Start Date End Date Taking? Authorizing Provider  acetaminophen (  TYLENOL) 500 MG tablet Take 1 tablet (500 mg total) by mouth every 6 (six) hours as needed. Mild pain. 03/02/12   Elease Etienne, MD  ALPRAZolam Prudy Feeler) 0.25 MG tablet Take 0.125 mg by mouth. Takes 0.5 tablet by mouth three times a day and a whole tablet by mouth at night    Historical Provider, MD  amoxicillin (AMOXIL) 500 MG capsule Take 500 mg by mouth 2 (two) times daily. 11/06/15   Historical Provider, MD  aspirin 81 MG tablet Take 81 mg by mouth daily.      Historical Provider, MD  benzonatate (TESSALON) 200 MG capsule Take 200 mg by mouth 3 (three) times daily as needed for cough.    Historical Provider, MD  bismuth subsalicylate (PEPTO BISMOL) 262 MG chewable tablet Chew 524 mg by mouth as needed (indigestion).      Historical Provider, MD  carisoprodol (SOMA) 250 MG tablet Take 225 mg by mouth as needed (muscle pain).     Historical Provider, MD  Cholecalciferol (VITAMIN D PO) Take 400 Units by mouth 2 (two) times daily.    Historical Provider, MD  clopidogrel (PLAVIX) 75 MG tablet TAKE 1 TABLET BY MOUTH EVERY DAY 06/29/15   Cassell Clement, MD  diphenhydrAMINE (BENADRYL) 12.5 MG/5ML elixir Take 12.5 mg by mouth 4 (four) times daily as needed. itching    Historical Provider, MD  docusate sodium (COLACE) 100 MG capsule Take 100 mg by mouth 3 (three) times daily as needed (constipation).     Historical Provider, MD  ferrous sulfate 325 (65 FE) MG tablet Take 325 mg by mouth daily.    Historical Provider, MD  furosemide (LASIX) 20 MG tablet Take 10 mg by mouth daily. 10/27/15   Historical Provider, MD  lactose free nutrition (BOOST PLUS) LIQD Take 237 mLs by mouth daily after lunch daily after lunch. 03/02/12   Elease Etienne, MD  Multiple Vitamin (MULTIVITAMIN) capsule Take 1 capsule by mouth daily.    Historical Provider, MD  MYRBETRIQ 50 MG TB24 tablet Take 50 mg by mouth daily. 08/17/15   Historical Provider, MD  NITROSTAT 0.4 MG SL tablet PLACE 1 TABLET UNDER THE TONGUE EVERY 5 MINUTES AS NEEDED FOR CHEST PAIN 06/08/15   Cassell Clement, MD  OVER THE COUNTER MEDICATION Take 1 Dose by mouth daily. Vit b-50    Historical Provider, MD  OVER THE COUNTER MEDICATION Take 1 Dose by mouth daily. D-mannose    Historical Provider, MD  pantoprazole (PROTONIX) 40 MG tablet Take 40 mg by mouth daily.    Historical Provider, MD  polyethylene glycol (MIRALAX / GLYCOLAX) packet Take 17 g by mouth daily as needed. constipation    Historical Provider, MD  pregabalin (LYRICA) 50 MG capsule Take 50 mg by mouth daily as needed (shingles pain).     Historical Provider, MD  sodium chloride 1 G tablet Take 2 g by mouth 3 (three) times daily. 03/02/12   Elease Etienne, MD   BP 130/73 mmHg  Pulse 100  Temp(Src) 98.7 F (37.1 C)  (Oral)  Resp 18  Ht  (1.549 m)  Wt 47.174 kg  BMI 19.66 kg/m2  SpO2 96% Physical Exam  Constitutional: She is oriented to person, place, and time.  HENT:  Right Ear: External ear normal.  Left Ear: External ear normal.  Nose: Nose normal.  Mouth/Throat: Oropharynx is clear and moist. No oropharyngeal exudate.  Eyes: Conjunctivae and EOM are normal. Pupils are equal, round, and reactive to light.  Neck: Normal range of motion. Neck supple.  Cardiovascular: Normal rate, regular rhythm, normal heart sounds and intact distal pulses.   Pulmonary/Chest: Effort normal and breath sounds normal. No respiratory distress. She exhibits no tenderness.  Able to speak in full sentences. No tachypnea. +bilateral basilar crackles  Abdominal: Soft. Bowel sounds are normal. She exhibits no distension. There is no tenderness. There is no rebound and no guarding.  Musculoskeletal: She exhibits no edema.  No pitting edema in bilateral LE  Neurological: She is alert and oriented to person, place, and time. No cranial nerve deficit.  Skin: Skin is warm and dry.  Psychiatric: She has a normal mood and affect.  Nursing note and vitals reviewed.   ED Course  Procedures (including critical care time) Labs Review Labs Reviewed  BASIC METABOLIC PANEL - Abnormal; Notable for the following:    Sodium 132 (*)    Chloride 100 (*)    Glucose, Bld 154 (*)    Calcium 8.8 (*)    All other components within normal limits  TROPONIN I - Abnormal; Notable for the following:    Troponin I 0.04 (*)    All other components within normal limits  BRAIN NATRIURETIC PEPTIDE - Abnormal; Notable for the following:    B Natriuretic Peptide 1231.2 (*)    All other components within normal limits  CBC WITH DIFFERENTIAL/PLATELET  URINALYSIS, ROUTINE W REFLEX MICROSCOPIC (NOT AT [facility])  TROPONIN I    Imaging Review Dg Chest 2 View  11/09/2015  CLINICAL DATA:  82 year old presenting with 1 week history of chest pain,  cough, and chest congestion. Current history of hypertension and ischemic heart disease. Prior CABG. EXAM: CHEST  2 VIEW COMPARISON:  11/06/2015 and earlier. FINDINGS: Sternotomy for CABG. Cardiac silhouette moderately enlarged. Interval development of interstitial pulmonary edema since the examination 3 days ago. Biapical pleuroparenchymal scarring, right greater than left. New small bilateral pleural effusions. Mild compression fracture of the upper endplate of T10 with slight kyphous deformity, unchanged. IMPRESSION: Mild CHF, with moderate cardiomegaly and mild diffuse interstitial pulmonary edema associated with small bilateral pleural effusions. Electronically Signed   By: Hulan Saas M.D.   On: 11/09/2015 16:44   I have personally reviewed and evaluated these images and lab results as part of my medical decision-making.   EKG Interpretation   Date/Time:  Thursday November 09 2015 16:13:39 EST Ventricular Rate:  101 PR Interval:  158 QRS Duration: 126 QT Interval:  368 QTC Calculation: 477 R Axis:   93 Text Interpretation:  Sinus tachycardia Left bundle branch block Cannot  rule out Septal infarct , age undetermined T wave abnormality, consider  inferolateral ischemia Abnormal ECG Left bundle branch block is new in  comparison to prior ECG 2012 Confirmed by [facility] MD, ERIN ([id number]) on  11/09/2015 5:00:20 PM      MDM   Final diagnoses:  Acute congestive heart failure, unspecified congestive heart failure type (HCC)    Troponin 0.04. BNP 1231. CXR shows bilateral interstitial pulmonary edema and bilateral pleural effusions. EKG with LBBB and LAD. Given home dose of IV lasix . Pt remains pain free in the ED though continues to endorse some chest tightness and orthopnea. Spoke to Dr. Purvis Sheffield of cardiology, admitting patient to cardiology, inpatient tele at [facility] for new heart failure.     Carlene Coria, PA-C 11/09/15 1940  Alvira Monday, MD 11/10/15 [phone]

## 2015-11-09 NOTE — ED Notes (Signed)
Patient transported to radiology department via stretcher. 

## 2015-11-10 ENCOUNTER — Observation Stay (HOSPITAL_BASED_OUTPATIENT_CLINIC_OR_DEPARTMENT_OTHER): Payer: Medicare Other

## 2015-11-10 DIAGNOSIS — I5021 Acute systolic (congestive) heart failure: Secondary | ICD-10-CM | POA: Diagnosis not present

## 2015-11-10 DIAGNOSIS — R0600 Dyspnea, unspecified: Secondary | ICD-10-CM | POA: Diagnosis not present

## 2015-11-10 DIAGNOSIS — E871 Hypo-osmolality and hyponatremia: Secondary | ICD-10-CM

## 2015-11-10 LAB — BASIC METABOLIC PANEL
ANION GAP: 11 (ref 5–15)
ANION GAP: 10 (ref 5–15)
BUN: 12 mg/dL (ref 6–20)
BUN: 15 mg/dL (ref 6–20)
CALCIUM: 8.5 mg/dL — AB (ref 8.9–10.3)
CALCIUM: 8.6 mg/dL — AB (ref 8.9–10.3)
CO2: 25 mmol/L (ref 22–32)
CO2: 27 mmol/L (ref 22–32)
Chloride: 93 mmol/L — ABNORMAL LOW (ref 101–111)
Chloride: 93 mmol/L — ABNORMAL LOW (ref 101–111)
Creatinine, Ser: 0.65 mg/dL (ref 0.44–1.00)
Creatinine, Ser: 0.67 mg/dL (ref 0.44–1.00)
GFR calc non Af Amer: >60 mL/min (ref >60)
Glucose, Bld: 117 mg/dL — ABNORMAL HIGH (ref 65–99)
Glucose, Bld: 110 mg/dL — ABNORMAL HIGH (ref 65–99)
Potassium: 3.4 mmol/L — ABNORMAL LOW (ref 3.5–5.1)
Potassium: 3.9 mmol/L (ref 3.5–5.1)
SODIUM: 129 mmol/L — AB (ref 135–145)
SODIUM: 130 mmol/L — AB (ref 135–145)

## 2015-11-10 LAB — COMPREHENSIVE METABOLIC PANEL
ALT: 31 U/L (ref 14–54)
ANION GAP: 10 (ref 5–15)
AST: 31 U/L (ref 15–41)
Albumin: 3.1 g/dL — ABNORMAL LOW (ref 3.5–5.0)
Alkaline Phosphatase: 66 U/L (ref 38–126)
BILIRUBIN TOTAL: 0.5 mg/dL (ref 0.3–1.2)
BUN: 15 mg/dL (ref 6–20)
CHLORIDE: 94 mmol/L — AB (ref 101–111)
CO2: 28 mmol/L (ref 22–32)
Calcium: 8.9 mg/dL (ref 8.9–10.3)
Creatinine, Ser: 0.69 mg/dL (ref 0.44–1.00)
Glucose, Bld: 118 mg/dL — ABNORMAL HIGH (ref 65–99)
POTASSIUM: 4.3 mmol/L (ref 3.5–5.1)
Sodium: 132 mmol/L — ABNORMAL LOW (ref 135–145)
TOTAL PROTEIN: 5.6 g/dL — AB (ref 6.5–8.1)

## 2015-11-10 LAB — CBC
HEMATOCRIT: 36.2 % (ref 36.0–46.0)
Hemoglobin: 12.7 g/dL (ref 12.0–15.0)
MCH: 32.9 pg (ref 26.0–34.0)
MCHC: 35.1 g/dL (ref 30.0–36.0)
MCV: 93.8 fL (ref 78.0–100.0)
Platelets: 198 10*3/uL (ref 150–400)
RBC: 3.86 MIL/uL — ABNORMAL LOW (ref 3.87–5.11)
RDW: 13.3 % (ref 11.5–15.5)
WBC: 8.4 10*3/uL (ref 4.0–10.5)

## 2015-11-10 LAB — TSH
TSH: 1.158 u[IU]/mL (ref 0.350–4.500)
TSH: 2.197 u[IU]/mL (ref 0.350–4.500)

## 2015-11-10 LAB — MRSA PCR SCREENING: MRSA BY PCR: NEGATIVE

## 2015-11-10 LAB — TROPONIN I
TROPONIN I: 0.05 ng/mL — AB (ref <0.031)
TROPONIN I: 0.06 ng/mL — AB (ref <0.031)
Troponin I: 0.06 ng/mL — ABNORMAL HIGH (ref <0.031)

## 2015-11-10 LAB — OSMOLALITY: OSMOLALITY: 274 mosm/kg — AB (ref 275–295)

## 2015-11-10 LAB — CORTISOL: CORTISOL PLASMA: 5 ug/dL

## 2015-11-10 MED ORDER — FUROSEMIDE 10 MG/ML IJ SOLN
40.0000 mg | Freq: Once | INTRAMUSCULAR | Status: AC
  Administered 2015-11-10: 40 mg via INTRAVENOUS
  Filled 2015-11-10: qty 4

## 2015-11-10 MED ORDER — POTASSIUM CHLORIDE CRYS ER 20 MEQ PO TBCR
20.0000 meq | EXTENDED_RELEASE_TABLET | Freq: Once | ORAL | Status: AC
  Administered 2015-11-10: 20 meq via ORAL
  Filled 2015-11-10: qty 1

## 2015-11-10 MED ORDER — LIVING BETTER WITH HEART FAILURE BOOK
Freq: Once | Status: AC
  Administered 2015-11-10: 22:00:00

## 2015-11-10 MED ORDER — ALPRAZOLAM 0.25 MG PO TABS
0.2500 mg | ORAL_TABLET | Freq: Every evening | ORAL | Status: DC | PRN
  Administered 2015-11-10 – 2015-11-14 (×5): 0.25 mg via ORAL
  Filled 2015-11-10 (×5): qty 1

## 2015-11-10 NOTE — Progress Notes (Signed)
Came by to find name of renal physician  She shes Dr Briant Cedar  Need to get records re salt intake Pt feeling some better Still with cough but phlegm looser.    Per nursing pt had 1.5 L out from 40 mg lasix Exam still with volume increase   Will give additional 40 mg IV x 1 and 20 KCL BMET ordered

## 2015-11-10 NOTE — Consult Note (Signed)
Reason for Consult:  Hyponatremia Referring Physician:   Dietrich Pates, MD  Sabrina Moore is an 82 y.o. female.  HPI: Sabrina Moore is an 82 year old white female with a PMH sig for CAD (s/p CABG in 1996 and PCI/stent to SVG to diag; PCI / stent to SVG to intermedius in 2001)who presented to [facility] clinic c/o persistent cough, SOB, and SSCP requiring NTG daily.  She was sent to [facility] and she was noted to have pulmonary edema as well as hyponatremia with serum sodium of 132.  She was started on IV lasix with improvement of her volume status but her serum Na level did drop to 129 and is up to 130.  She has been taking 6 grams of sodium chloride tabs daily and adheres to a high sodium diet due to the fact that she was instructed to take salt tabs after she was hospitalized in 2013 with a serum sodium as low as 123 that was thought to be related to SIADH possibly related to Celexa.  Cardiology is concerned that her high sodium intake may have contributed to her episode of decompensated CHF, however she was very reluctant to stop these medications due to dizziness.  We were consulted to help further evaluate and manage her hyponatremia.  She was initially referred to our practice by Dr. Robley Fries for hyponatremia in October of 2015. The patient's history of hyponatremia dates back to at least 2011, and work up included normal cortisol and TSH levels and UOsm was 317at that time. She was initially placed on Lasix, but apparently her serum sodium decreased, although the exact I's and O's at that time are unknown. She subsequently was switched to salt tablets and discharged on those and has been on salt tablets ever since. She says when her serum sodium level is less than 131 is when she feels "lousy." Ultrasound of the abdomen done in September 2015 because of abdominal pains showed a right kidney of 9.3 cm, left kidney of 10.3 cm, with a small cyst in the left kidney. There are no masses or  hydronephrosis.  She was seen by Dr. Briant Cedar on 07/14/14 and felt that her hyponatremia was likely due to a combination of an ADH effect along with a reset osmostat and stressed the importance of fluid restriction (specifically water) and to start furosemide and taper her sodium chloride tabs down, however she felt "great" at 5 grams per day and refused to lower any further and cancelled any follow up appointments with our practice. She now presents with decompensated CHF and volume overload, and we were consulted due to Cardiology's concern that this is contributing to her decompensated CHF.  Trends in Sodium:  SODIUM  Date/Time Value Ref Range Status  11/10/2015 11:59 AM 130* 135 - 145 mmol/L Final  11/10/2015 05:21 AM 129* 135 - 145 mmol/L Final  11/09/2015 04:09 PM 132* 135 - 145 mmol/L Final  05/27/2014 02:42 PM 129* 135 - 145 mEq/L Final  10/01/2013 02:41 PM 135 135 - 145 mEq/L Final  05/15/2012 12:25 PM 129* 135 - 145 mEq/L Final  03/02/2012 05:41 AM 128* 135 - 145 mEq/L Final  03/01/2012 05:29 AM 128* 135 - 145 mEq/L Final  02/29/2012 05:20 AM 123* 135 - 145 mEq/L Final  02/28/2012 04:59 AM 128* 135 - 145 mEq/L Final  02/27/2012 05:15 AM 128* 135 - 145 mEq/L Final  02/26/2012 07:40 PM 126* 135 - 145 mEq/L Final  07/22/2011 05:34 AM 130* 135 - 145 mEq/L Final  07/21/2011 05:40 AM 127* 135 - 145 mEq/L Final  07/20/2011 05:15 AM 128* 135 - 145 mEq/L Final  07/19/2011 05:00 AM 131* 135 - 145 mEq/L Final  07/15/2011 12:25 PM 133* 135 - 145 mEq/L Final  06/25/2011 12:32 PM 131* 135 - 145 mEq/L Final  06/21/2011 02:59 PM 131* 135 - 145 mEq/L Final  06/15/2011 06:00 AM 136 135 - 145 mEq/L Final  06/14/2011 05:40 AM 134* 135 - 145 mEq/L Final  06/13/2011 10:03 AM 130* 135 - 145 mEq/L Final  06/13/2011 01:31 AM 127* 135 - 145 mEq/L Final  06/12/2011 08:01 PM 128* 135 - 145 mEq/L Final  06/12/2011 02:32 PM 128* 135 - 145 mEq/L Final  06/12/2011 07:51 AM 114* 135 - 145 mEq/L Final   06/12/2011 07:05 AM 114* 135 - 145 mEq/L Final  05/09/2011 10:01 AM 133* 135 - 145 mEq/L Final  03/12/2011 11:06 AM 132* 135 - 145 mEq/L Final  06/14/2010 05:19 AM 139 135 - 145 mEq/L Final  06/13/2010 08:13 PM 139 135 - 145 mEq/L Final  06/13/2010 12:25 PM 132* 135 - 145 mEq/L Final  06/13/2010 11:35 AM 132* 135 - 145 mEq/L Final  03/28/2010 01:15 PM 135 135 - 145 mEq/L Final  03/28/2010 09:07 AM 133* 135 - 145 mEq/L Final  03/27/2010 10:53 PM 130* 135 - 145 mEq/L Final  02/13/2010 05:07 AM 129* 135 - 145 mEq/L Final  02/10/2010 05:00 AM 138 135 - 145 mEq/L Final   PMH:   Past Medical History  Diagnosis Date  . IHD (ischemic heart disease)     Stents placed in 2012?0-on plavix and asa   . Chest discomfort   . Abnormal stress ECG with treadmill 02/01/10  . Dyslipidemia     Intolerant to statins  . [facility]     [facility]  . IDA (iron deficiency anemia)     NO scopes recently? was controverial  . IBS (irritable bowel syndrome)     Seen by Dr. [name] Jan 2013-advised not to do this  . Fatigue   . Anxiety   . Hypertension   . Hypercholesterolemia   . Pneumonia   . Angina   . Shingles     End of august 2012-post-herpeticv Neuralgia    PSH:   Past Surgical History  Procedure Laterality Date  . Coronary artery bypass graft  1996  . Cardiac catheterization  02/09/10  . Appendectomy  1967  . Tonsillectomy  1933  . Oophorectomy  1967  . Eye surgery  2009    catarac  . Squamous cell carcinoma excision  2012    from nose     Allergies:  Allergies  Allergen Reactions  . Amitriptyline Other (See Comments)    delusions  . Avelox [Moxifloxacin Hcl In Nacl] Nausea And Vomiting  . Carisoprodol Other (See Comments)    Hallucinations. Can take a small dose. 1/4 tablet ok   . Citalopram Hydrobromide Other (See Comments)    Increased sodium levels  . Crestor [Rosuvastatin Calcium] Other (See Comments)    Myalgias   . Erythromycin Other (See Comments)     Stomach problems  . Flexeril [Cyclobenzaprine] Other (See Comments)    Extreme drowsiness   . Hydrocodone-Acetaminophen Nausea And Vomiting  . Lescol     Gi symptoms  . Lescol [Fluvastatin]     ?  Marland Kitchen Levaquin [Levofloxacin] Diarrhea  . Lidocaine Other (See Comments)    Body rash  . Lipitor [Atorvastatin Calcium]     Myalgias   .  Lovastatin     Myalgias   . Macrobid [Nitrofurantoin Monohyd Macro]     Diarrhea   . Novocain [Procaine]     syncope  . Oxycodone Nausea And Vomiting  . Pravachol     Myalgias   . Procaine Hcl     Syncope   . Promethazine Hcl     No good  . Shellfish Allergy Hives  . Wellbutrin [Bupropion]     paranoia   . Zocor [Simvastatin]     Myalgias   . Zofran [Ondansetron]     abdominal cramps    Medications:   Prior to Admission medications   Medication Sig Start Date End Date Taking? Authorizing Provider  acetaminophen (TYLENOL) 500 MG tablet Take 1 tablet (500 mg total) by mouth every 6 (six) hours as needed. Mild pain. Patient taking differently: Take 500 mg by mouth every 6 (six) hours as needed for mild pain or headache. Mild pain. 03/02/12  Yes Elease Etienne, MD  ALPRAZolam Prudy Feeler) 0.25 MG tablet Take 0.125-0.25 mg by mouth 4 (four) times daily - after meals and at bedtime. Takes 0.5 tablet by mouth three times a day and a whole tablet by mouth at bedtime   Yes Historical Provider, MD  aspirin 81 MG tablet Take 81 mg by mouth daily.     Yes Historical Provider, MD  carisoprodol (SOMA) 250 MG tablet Take 62.5 mg by mouth as needed (1/4 tab, muscle pain).    Yes Historical Provider, MD  Cholecalciferol (VITAMIN D3) 5000 units TABS Take 5,000 Units by mouth daily.   Yes Historical Provider, MD  clopidogrel (PLAVIX) 75 MG tablet TAKE 1 TABLET BY MOUTH EVERY DAY 06/29/15  Yes Cassell Clement, MD  diphenhydrAMINE (BENADRYL) 12.5 MG/5ML elixir Take 12.5 mg by mouth 4 (four) times daily as needed. itching   Yes Historical Provider, MD  docusate  sodium (COLACE) 100 MG capsule Take 100 mg by mouth 3 (three) times daily as needed (constipation).    Yes Historical Provider, MD  ferrous sulfate 325 (65 FE) MG tablet Take 325 mg by mouth daily.   Yes Historical Provider, MD  furosemide (LASIX) 20 MG tablet Take 10 mg by mouth daily. 10/27/15  Yes Historical Provider, MD  Multiple Vitamin (MULTIVITAMIN) capsule Take 1 capsule by mouth daily.   Yes Historical Provider, MD  MYRBETRIQ 50 MG TB24 tablet Take 50 mg by mouth daily. 08/17/15  Yes Historical Provider, MD  saccharomyces boulardii (FLORASTOR) 250 MG capsule Take 250 mg by mouth daily.   Yes Historical Provider, MD  sodium chloride 1 G tablet Take 2 g by mouth 3 (three) times daily. 03/02/12  Yes Elease Etienne, MD  lactose free nutrition (BOOST PLUS) LIQD Take 237 mLs by mouth daily after lunch daily after lunch. Patient not taking: Reported on 11/10/2015 03/02/12   Elease Etienne, MD  NITROSTAT 0.4 MG SL tablet PLACE 1 TABLET UNDER THE TONGUE EVERY 5 MINUTES AS NEEDED FOR CHEST PAIN 06/08/15   Cassell Clement, MD  pantoprazole (PROTONIX) 40 MG tablet Take 40 mg by mouth daily.    Historical Provider, MD    Discontinued Meds:   Medications Discontinued During This Encounter  Medication Reason  . pregabalin (LYRICA) 50 MG capsule Patient has not taken in last 30 days  . acetaminophen (TYLENOL) tablet 500 mg Duplicate - PRN Policy  . aspirin tablet 81 mg Duplicate  . bismuth subsalicylate (PEPTO BISMOL) chewable tablet 524 mg Not available  . multivitamin capsule 1 capsule   .  OVER THE COUNTER MEDICATION Patient Preference  . OVER THE COUNTER MEDICATION Patient Preference  . polyethylene glycol (MIRALAX / GLYCOLAX) packet Patient Preference  . Cholecalciferol (VITAMIN D PO) Dose change  . bismuth subsalicylate (PEPTO BISMOL) 262 MG chewable tablet Patient Preference  . benzonatate (TESSALON) 200 MG capsule Patient Preference  . amoxicillin (AMOXIL) 500 MG capsule Completed Course     Social History:  reports that she has never smoked. She does not have any smokeless tobacco history on file. She reports that she does not drink alcohol or use illicit drugs.  Family History:   Family History  Problem Relation Age of Onset  . Breast cancer Mother   . Ovarian cancer Mother   . Cancer Mother 90    ovarian  . Heart disease Father     54 at age of death  . Arthritis Father     Pertinent items are noted in HPI.  Blood pressure 100/61, pulse 87, temperature 98.8 F (37.1 C), temperature source Oral, resp. rate 24, height  (1.575 m), weight 48.762 kg (107 lb 8 oz), SpO2 93 %. General appearance: alert, cooperative and no distress Head: Normocephalic, without obvious abnormality, atraumatic Eyes: negative findings: lids and lashes normal, conjunctivae and sclerae normal and corneas clear Neck: no adenopathy, no carotid bruit, no JVD, supple, symmetrical, trachea midline and thyroid not enlarged, symmetric, no tenderness/mass/nodules Resp: rales bilaterally Cardio: regular rate and rhythm and no rub GI: soft, non-tender; bowel sounds normal; no masses,  no organomegaly Extremities: extremities normal, atraumatic, no cyanosis or edema  Labs: Basic Metabolic Panel:  Recent Labs Lab 11/09/15 1609 11/10/15 0521 11/10/15 1159  NA 132* 129* 130*  K 4.4 3.4* 3.9  CL 100* 93* 93*  CO2 GLUCOSE 154* 117* 110*  BUN CREATININE 0.56 0.65 0.67  CALCIUM 8.8* 8.5* 8.6*   Liver Function Tests: No results for input(s): AST, ALT, ALKPHOS, BILITOT, PROT, ALBUMIN in the last 168 hours. No results for input(s): LIPASE, AMYLASE in the last 168 hours. No results for input(s): AMMONIA in the last 168 hours. CBC:  Recent Labs Lab 11/09/15 1609 11/10/15 0521  WBC 9.4 8.4  NEUTROABS 6.8  --   HGB 13.1 12.7  HCT 38.9 36.2  MCV 94.2 93.8  PLT 217 198   PT/INR: (inr:5) Cardiac Enzymes:  Recent Labs Lab 11/09/15 1609 11/10/15 0044  11/10/15 0521 11/10/15 1159  TROPONINI 0.04* 0.06* 0.06* 0.05*   CBG: No results for input(s): GLUCAP in the last 168 hours.  Iron Studies: No results for input(s): IRON, TIBC, TRANSFERRIN, FERRITIN in the last 168 hours.  Xrays/Other Studies: Dg Chest 2 View  11/09/2015  CLINICAL DATA:  82 year old presenting with 1 week history of chest pain, cough, and chest congestion. Current history of hypertension and ischemic heart disease. Prior CABG. EXAM: CHEST  2 VIEW COMPARISON:  11/06/2015 and earlier. FINDINGS: Sternotomy for CABG. Cardiac silhouette moderately enlarged. Interval development of interstitial pulmonary edema since the examination 3 days ago. Biapical pleuroparenchymal scarring, right greater than left. New small bilateral pleural effusions. Mild compression fracture of the upper endplate of T10 with slight kyphous deformity, unchanged. IMPRESSION: Mild CHF, with moderate cardiomegaly and mild diffuse interstitial pulmonary edema associated with small bilateral pleural effusions. Electronically Signed   By: Hulan Saas M.D.   On: 11/09/2015 16:44     Assessment/Plan: 1. Hyponatremia- has been chronic issue since 2011 and initially thought to be related to Celexa or SIADH, but  re-evaluated in 2015 by [facility] and more consistent with ADH effect and reset osmostat.  Currently her hyponatremia is most likely related to CHF/volume overload and pulmonary issues.   1. Will recheck UNa, Uosm, Sosm, TSH, and Cortisol 2. Continue to hold sodium tabs for now and follow with diuretics alone and water restriction of daily 3. She may require some sodium chloride due to reset osmostat but will require careful titration. 4. Also unsure if she has protein malnutrition which could contribute to inability to maximally dilute urine. Will check albumin tomorrow. 5. Will also check orthostatic BP's to r/o autonomic dysfunction as a possible cause of her dizziness 2. CHF- diuresed well 1.7L over  the last 24 hours but still has rales.  Will continue with diuresis per Cardiology and discussed with patient and daughter that we will continue to hold sodium tabs until she is re-evaluated with urine studies.  Agree with current management and await ECHO results 3. Back pain- due to compression fx of T10 4. Dispo- not stable for discharge at this time.  Pt is currently in independent living and would like to continue to do so.  She may require PT for deconditioning.   [name] Shakura Cowing 11/10/2015, 3:06 PM

## 2015-11-10 NOTE — Progress Notes (Signed)
Heart Failure Navigator Consult Note  Presentation: Sabrina Moore is a 82 yo who was seen in clinic today . Hx of CAD (s/p CABG in 1996 In 2011 had PCI/stent to SVG to diag; PCI / stent to SVG to intermedius)  Hx of severe, persistent cough for several days Seen by primary MD 3 days ago Taking tessalon perles No improvement Rx with amoxil on Monday No improvement. Pt can'Sabrina lie flat. Taking SL NTG because of R shoulder/arm pain, bilateral chest pain. Pain has been across chest  PO intake down. Today seen in Cardiology Clinic by Sabrina Moore Pt had bibasilar rales Told to go to ER to evaluate / Rx further.  IN [facility] chest tight but not in pain. SOB Orthopnea. Did had diarrhea since 3 AM Took immodium None since   Past Medical History  Diagnosis Date  . IHD (ischemic heart disease)     Stents placed in 2012?0-on plavix and asa   . Chest discomfort   . Abnormal stress ECG with treadmill 02/01/10  . Dyslipidemia     Intolerant to statins  . [facility]     [facility]  . IDA (iron deficiency anemia)     NO scopes recently? was controverial  . IBS (irritable bowel syndrome)     Seen by Dr. Greggory Moore Jan 2013-advised not to do this  . Fatigue   . Anxiety   . Hypertension   . Hypercholesterolemia   . Pneumonia   . Angina   . Shingles     End of august 2012-post-herpeticv Neuralgia    Social History   Social History  . Marital Status: Widowed    Spouse Name: N/A  . Number of Children: N/A  . Years of Education: N/A   Social History Main Topics  . Smoking status: Never Smoker   . Smokeless tobacco: None  . Alcohol Use: No  . Drug Use: No  . Sexual Activity: Not Asked   Other Topics Concern  . None   Social History Narrative   Publishing rights manager for a bit.   Is from [place]- came [place] [address] to this [place]      [place] [facility] ([facility]) [phone]    ECHO:  pending  BNP    Component Value Date/Time   BNP 1231.2* 11/09/2015 1609    ProBNP    Component  Value Date/Time   PROBNP 121.0* 03/28/2010 0907     Education Assessment and Provision:  Detailed education and instructions provided on heart failure disease management including the following:  Signs and symptoms of Heart Failure When to call the physician Importance of daily weights Low sodium diet Fluid restriction Medication management Anticipated future follow-up appointments  Patient education given on each of the above topics.  Patient acknowledges understanding and acceptance of all instructions.  I spoke with Ms Sabrina Moore regarding her HF.  She tells me that she has had "heart problems"  (CABG and stents)  before yet never had any issues with "heart failure".  We discussed the diagnosis and HF recommendations for home.  She does not have a scale and has not been weighing at home.  We discussed the importance of daily weights and how weight gains relate to signs and symptoms of HF.  She says that she will have no issue getting a scale for home use.  She seems quite concerned about the sodium restriction recommendation due to her history of hyponatremia and wanted to wait until she is seen by nephrologist to  discuss that as well as fluid restriction.  I encouraged her that I would return to discuss those recommendations with she and her daughter.  She admits that the "Lasix" was making her dizzy at times at home and she would skip doses.  She currently lives in an [facility] and would like to return there.    Education Materials:  "Living Better With Heart Failure" Booklet, Daily Weight Tracker Tool    High Risk Criteria for Readmission and/or Poor Patient Outcomes:   EF <30%- pending  2 or more admissions in 6 months- No  Difficult social situation- Lives in independent Living facility--has a daughter that is local.  Demonstrates medication noncompliance- denies   Barriers of Care:  Knowledge and compliance   Discharge Planning:     She currently plans to  return to her independent living facility.  She will benefit from [facility] for ongoing education, compliance reinforcement and symptom recognition.

## 2015-11-10 NOTE — Plan of Care (Signed)
Problem: Activity: Goal: Risk for activity intolerance will decrease Outcome: Progressing Pt ambulated to bathroom with front wheel walker, tolerated fair. Complained of legs cramping, says happens when K is low. Urine output daughter at bedside

## 2015-11-10 NOTE — Care Management Obs Status (Signed)
MEDICARE OBSERVATION STATUS NOTIFICATION   Patient Details  Name: Sabrina Moore MRN: [id number] Date of Birth: Aug 25, 1927   Medicare Observation Status Notification Given:  Yes    Gala Lewandowsky, RN 11/10/2015, 4:35 PM

## 2015-11-10 NOTE — Plan of Care (Signed)
Problem: Cardiac: Goal: Ability to achieve and maintain adequate cardiopulmonary perfusion will improve Outcome: Progressing Patient and family educated at bedside re: Initial plan of care (heart failure) Tests/procedures/labs Medications Pain Need to call RN for symptoms Unit orientation/unit routines  Family currently have concerns about patient's home medication regimen. She takes salt tablets several times daily. Her son states that it has been a "balancing act" to get her sodium within tolerance; that if she falls outside the normal range, she typically has problems with confusion, weakness and falls. Noted here for MD review and follow-up on admission. Will also communicate to oncoming shift and to charge RN to ensure continuity of care.  See intake note for additional information.  Continuing to monitor.

## 2015-11-10 NOTE — Progress Notes (Signed)
  Echocardiogram 2D Echocardiogram has been performed.  Cathie Beams 11/10/2015, 4:41 PM

## 2015-11-10 NOTE — Progress Notes (Signed)
Cardiologist: [name]  Subjective:  Feels better  Objective:  Vital Signs in the last 24 hours: Temp:  [97.9 F (36.6 C)-98.8 F (37.1 C)] 98.8 F (37.1 C) (01/27 1317) Pulse Rate:  [82-108] 87 (01/27 1317) Resp:  [18-27] 24 (01/27 1317) BP: (96-130)/(56-83) 100/61 mmHg (01/27 1317) SpO2:  [91 %-96 %] 93 % (01/27 1317) Weight:  [104 lb (47.174 kg)-111 lb 6.4 oz (50.531 kg)] 107 lb 8 oz (48.762 kg) (01/27 0335)  Intake/Output from previous day: 01/26 0701 - 01/27 0700 In: -  Out: 1550 [Urine:1550]   Physical Exam: General: Thin in no acute distress. Head:  Normocephalic and atraumatic. Lungs: Clear to auscultation and percussion. No significant crackles Heart: Normal S1 and S2.  No murmur, rubs or gallops.  Abdomen: soft, non-tender, positive bowel sounds. Extremities: No clubbing or cyanosis. No edema. Neurologic: Alert and oriented x 3.    Lab Results:  Recent Labs  11/09/15 1609 11/10/15 0521  WBC 9.4 8.4  HGB 13.1 12.7  PLT 217 198    Recent Labs  11/10/15 0521 11/10/15 1159  NA 129* 130*  K 3.4* 3.9  CL 93* 93*  CO2 25 27  GLUCOSE 117* 110*  BUN 12 15  CREATININE 0.65 0.67    Recent Labs  11/10/15 0521 11/10/15 1159  TROPONINI 0.06* 0.05*    Imaging: Dg Chest 2 View  11/09/2015  CLINICAL DATA:  82 year old presenting with 1 week history of chest pain, cough, and chest congestion. Current history of hypertension and ischemic heart disease. Prior CABG. EXAM: CHEST  2 VIEW COMPARISON:  11/06/2015 and earlier. FINDINGS: Sternotomy for CABG. Cardiac silhouette moderately enlarged. Interval development of interstitial pulmonary edema since the examination 3 days ago. Biapical pleuroparenchymal scarring, right greater than left. New small bilateral pleural effusions. Mild compression fracture of the upper endplate of T10 with slight kyphous deformity, unchanged. IMPRESSION: Mild CHF, with moderate cardiomegaly and mild diffuse interstitial  pulmonary edema associated with small bilateral pleural effusions. Electronically Signed   By: Hulan Saas M.D.   On: 11/09/2015 16:44   Personally viewed.     Meds: Scheduled Meds: . aspirin EC  81 mg Oral Daily  . cholecalciferol  400 Units Oral BID  . clopidogrel  75 mg Oral Daily  . enoxaparin (LOVENOX) injection  40 mg Subcutaneous Q24H  . ferrous sulfate  325 mg Oral Q breakfast  . lactose free nutrition  237 mL Oral QPC lunch  . Living Better with Heart Failure Book   Does not apply Once  . mirabegron ER  50 mg Oral Daily  . multivitamin with minerals  1 tablet Oral Daily  . pantoprazole  40 mg Oral Daily  . sodium chloride flush  3 mL Intravenous Q12H   Continuous Infusions:  PRN Meds:.sodium chloride, acetaminophen, ALPRAZolam, benzonatate, diphenhydrAMINE, docusate sodium, ondansetron (ZOFRAN) IV, sodium chloride flush  Assessment/Plan:  Active Problems:   CHF (congestive heart failure) (HCC)  Hyponatremia  - water restriction  - She took 4-6g of sodium at home  - Daughter worried if Na <130, feels poor and needs assisted living  - takes  Lasix at home but sometimes stops because feels dizzy  - Continue with IV lasix. Will give  IV again with 20 KCL  - Have to balance Na tabs with congestion and lasix  - Would be nice for her to revisit with Dr. Briant Cedar as outpt.   Base weight 104 pounds. 111 on admit.   Mildly elevated troponin consistent with demand  ischemia. No CP.   Sabrina Moore 11/10/2015, 1:48 PM

## 2015-11-10 NOTE — Progress Notes (Signed)
    Ejection fraction on echocardiogram 20-25%, markedly reduced from 2013 echocardiogram of 60%. Associated mitral regurgitation noted.  Conveyed this news to her and her daughter.   Presented her potential options such as medical management versus cardiac catheterization.  Also in obtaining further history, she did have rhinorrhea last week then cough. I wonder if this potentially could be viral mediated cardiomyopathy. However with her prior bypass surgery, this certainly could be ischemic.  She also relayed to me that Dr. Deborah Chalk described her last cardiac catheterization as quite challenging, he placed 3 stents she remembers.  She does state that her father died of heart failure. She remembers [name] helping him near the end, morphine for instance.  She did state to me that she understands that this increases her risk of dying and that "she has lived a good life" and is ready when it is time.  They will think about potential options. She understands potential risks of invasive management, bleeding risk given her thin body habitus, risk of stroke, risk of renal impairment.  For now, she is feeling much better after decongestion with Lasix.  Her daughter was concerned about potential drug side effects from Ace inhibitors or beta blockers. I do not see them listed in her drug allergies.  Her pressures now are quite soft. Tomorrow, we may wish to try lisinopril 2.5 mg once a day.  Appreciate nephrology assistance as well. I think now we should try to avoid any sodium supplement.  Donato Schultz, MD

## 2015-11-11 DIAGNOSIS — I447 Left bundle-branch block, unspecified: Secondary | ICD-10-CM | POA: Diagnosis present

## 2015-11-11 DIAGNOSIS — Z951 Presence of aortocoronary bypass graft: Secondary | ICD-10-CM

## 2015-11-11 DIAGNOSIS — E878 Other disorders of electrolyte and fluid balance, not elsewhere classified: Secondary | ICD-10-CM | POA: Diagnosis present

## 2015-11-11 DIAGNOSIS — F419 Anxiety disorder, unspecified: Secondary | ICD-10-CM | POA: Diagnosis present

## 2015-11-11 DIAGNOSIS — I959 Hypotension, unspecified: Secondary | ICD-10-CM | POA: Clinically undetermined

## 2015-11-11 DIAGNOSIS — Z881 Allergy status to other antibiotic agents status: Secondary | ICD-10-CM | POA: Diagnosis not present

## 2015-11-11 DIAGNOSIS — Z885 Allergy status to narcotic agent status: Secondary | ICD-10-CM | POA: Diagnosis not present

## 2015-11-11 DIAGNOSIS — I5021 Acute systolic (congestive) heart failure: Secondary | ICD-10-CM | POA: Diagnosis not present

## 2015-11-11 DIAGNOSIS — I952 Hypotension due to drugs: Secondary | ICD-10-CM | POA: Diagnosis not present

## 2015-11-11 DIAGNOSIS — R54 Age-related physical debility: Secondary | ICD-10-CM | POA: Diagnosis present

## 2015-11-11 DIAGNOSIS — Z66 Do not resuscitate: Secondary | ICD-10-CM | POA: Diagnosis not present

## 2015-11-11 DIAGNOSIS — I9589 Other hypotension: Secondary | ICD-10-CM

## 2015-11-11 DIAGNOSIS — Z803 Family history of malignant neoplasm of breast: Secondary | ICD-10-CM | POA: Diagnosis not present

## 2015-11-11 DIAGNOSIS — Z886 Allergy status to analgesic agent status: Secondary | ICD-10-CM | POA: Diagnosis not present

## 2015-11-11 DIAGNOSIS — Z8041 Family history of malignant neoplasm of ovary: Secondary | ICD-10-CM | POA: Diagnosis not present

## 2015-11-11 DIAGNOSIS — Z888 Allergy status to other drugs, medicaments and biological substances status: Secondary | ICD-10-CM | POA: Diagnosis not present

## 2015-11-11 DIAGNOSIS — I255 Ischemic cardiomyopathy: Secondary | ICD-10-CM | POA: Diagnosis not present

## 2015-11-11 DIAGNOSIS — M4854XA Collapsed vertebra, not elsewhere classified, thoracic region, initial encounter for fracture: Secondary | ICD-10-CM | POA: Diagnosis present

## 2015-11-11 DIAGNOSIS — E7800 Pure hypercholesterolemia, unspecified: Secondary | ICD-10-CM | POA: Diagnosis present

## 2015-11-11 DIAGNOSIS — G8921 Chronic pain due to trauma: Secondary | ICD-10-CM | POA: Diagnosis present

## 2015-11-11 DIAGNOSIS — E785 Hyperlipidemia, unspecified: Secondary | ICD-10-CM | POA: Diagnosis present

## 2015-11-11 DIAGNOSIS — I248 Other forms of acute ischemic heart disease: Secondary | ICD-10-CM | POA: Diagnosis present

## 2015-11-11 DIAGNOSIS — Z91013 Allergy to seafood: Secondary | ICD-10-CM | POA: Diagnosis not present

## 2015-11-11 DIAGNOSIS — Z7902 Long term (current) use of antithrombotics/antiplatelets: Secondary | ICD-10-CM | POA: Diagnosis not present

## 2015-11-11 DIAGNOSIS — E871 Hypo-osmolality and hyponatremia: Secondary | ICD-10-CM | POA: Diagnosis not present

## 2015-11-11 DIAGNOSIS — Z515 Encounter for palliative care: Secondary | ICD-10-CM | POA: Diagnosis not present

## 2015-11-11 DIAGNOSIS — Z8249 Family history of ischemic heart disease and other diseases of the circulatory system: Secondary | ICD-10-CM | POA: Diagnosis not present

## 2015-11-11 DIAGNOSIS — I509 Heart failure, unspecified: Secondary | ICD-10-CM | POA: Diagnosis not present

## 2015-11-11 DIAGNOSIS — I110 Hypertensive heart disease with heart failure: Secondary | ICD-10-CM | POA: Diagnosis present

## 2015-11-11 DIAGNOSIS — I2510 Atherosclerotic heart disease of native coronary artery without angina pectoris: Secondary | ICD-10-CM | POA: Diagnosis present

## 2015-11-11 DIAGNOSIS — Z7982 Long term (current) use of aspirin: Secondary | ICD-10-CM | POA: Diagnosis not present

## 2015-11-11 DIAGNOSIS — K580 Irritable bowel syndrome with diarrhea: Secondary | ICD-10-CM | POA: Diagnosis present

## 2015-11-11 DIAGNOSIS — Z955 Presence of coronary angioplasty implant and graft: Secondary | ICD-10-CM | POA: Diagnosis not present

## 2015-11-11 LAB — RENAL FUNCTION PANEL
ANION GAP: 8 (ref 5–15)
Albumin: 3.1 g/dL — ABNORMAL LOW (ref 3.5–5.0)
BUN: 16 mg/dL (ref 6–20)
CHLORIDE: 96 mmol/L — AB (ref 101–111)
CO2: 29 mmol/L (ref 22–32)
Calcium: 8.8 mg/dL — ABNORMAL LOW (ref 8.9–10.3)
Creatinine, Ser: 0.72 mg/dL (ref 0.44–1.00)
GFR calc Af Amer: >60 mL/min (ref >60)
GFR calc non Af Amer: >60 mL/min (ref >60)
GLUCOSE: 113 mg/dL — AB (ref 65–99)
PHOSPHORUS: 3.8 mg/dL (ref 2.5–4.6)
POTASSIUM: 4.4 mmol/L (ref 3.5–5.1)
Sodium: 133 mmol/L — ABNORMAL LOW (ref 135–145)

## 2015-11-11 LAB — OSMOLALITY, URINE: OSMOLALITY UR: 466 mosm/kg (ref 300–900)

## 2015-11-11 LAB — CREATININE, URINE, RANDOM: Creatinine, Urine: 75.74 mg/dL

## 2015-11-11 LAB — SODIUM, URINE, RANDOM: SODIUM UR: 68 mmol/L

## 2015-11-11 MED ORDER — FUROSEMIDE 10 MG/ML IJ SOLN
40.0000 mg | Freq: Every day | INTRAMUSCULAR | Status: DC
  Administered 2015-11-11: 40 mg via INTRAVENOUS
  Filled 2015-11-11 (×2): qty 4

## 2015-11-11 MED ORDER — COSYNTROPIN 0.25 MG IJ SOLR
0.2500 mg | Freq: Once | INTRAMUSCULAR | Status: AC
  Administered 2015-11-12: 0.25 mg via INTRAVENOUS
  Filled 2015-11-11 (×2): qty 0.25

## 2015-11-11 NOTE — Evaluation (Signed)
Physical Therapy Evaluation Patient Details Name: Sabrina Moore MRN: [id number] DOB: June 15, 1927 Today's Date: 11/11/2015   History of Present Illness  Pt is a 82 y/o F admitted for chest pain, DOB and cough. Dx: CHF.  Pt's PMH includes ischemic heart disease w/ stent placement, vasovagal syncope, fatigue, anxiety, iron deficiency anemia.  Clinical Impression  Pt admitted with above diagnosis. Pt currently with functional limitations due to the deficits listed below (see PT Problem List). Sabrina Moore lives at an Ind living facility where she was at a mod I level of mobility using her rollator.  She currently requires min guard assist for safe mobility and is quick to fatigue.  Pt will benefit from skilled PT to increase their independence and safety with mobility to allow discharge to the venue listed below.     Follow Up Recommendations Home health PT;Other (comment);Supervision for mobility/OOB (Cardiopulmonary rehab)    Equipment Recommendations  None recommended by PT    Recommendations for Other Services OT consult     Precautions / Restrictions Precautions Precautions: Fall Restrictions Weight Bearing Restrictions: No      Mobility  Bed Mobility Overal bed mobility: Modified Independent             General bed mobility comments: Increased time  Transfers Overall transfer level: Needs assistance Equipment used: Rolling walker (2 wheeled) Transfers: Sit to/from Stand Sit to Stand: Min guard         General transfer comment: Cues for safe hand placement using RW  Ambulation/Gait Ambulation/Gait assistance: Min guard Ambulation Distance (Feet): 70 Feet Assistive device: Rolling walker (2 wheeled) Gait Pattern/deviations: Step-through pattern;Decreased stride length;Trunk flexed;Antalgic   Gait velocity interpretation: Below normal speed for age/gender General Gait Details: SpO2 remains at or above 91% ambulating on RA, HR fluctuates between 100-106.  Becomes SOB  after ambualting ~40 ft but improves w/o communication.  C/o Bil LE fatigue at end of ambulation which is not her baseline.  Stairs            Wheelchair Mobility    Modified Rankin (Stroke Patients Only)       Balance Overall balance assessment: Needs assistance Sitting-balance support: Feet supported;No upper extremity supported Sitting balance-Leahy Scale: Good     Standing balance support: During functional activity;Single extremity supported Standing balance-Leahy Scale: Fair Standing balance comment: Pt able to wipe after toileting w/ 1 UE supported on RW                             Pertinent Vitals/Pain Pain Assessment: No/denies pain    Home Living Family/patient expects to be discharged to:: Private residence Living Arrangements: Alone Available Help at Discharge: Family;Available 24 hours/day Type of Home: Independent living facility Home Access: Level entry     Home Layout: One level Home Equipment: Walker - 4 wheels;Cane - single point (rollator) Additional Comments: Family is planning to provide 24/7 assist for first few days after pt is d/c    Prior Function Level of Independence: Independent with assistive device(s)         Comments: Uses rollator for all ambulation.  Has a sitter who stay w/ her while she bathes, but does not need assist.  Has someone who comes to clean and do her laundry 2x/wk.     Hand Dominance        Extremity/Trunk Assessment   Upper Extremity Assessment: Overall WFL for tasks assessed  Lower Extremity Assessment: Generalized weakness      Cervical / Trunk Assessment: Kyphotic  Communication   Communication: No difficulties  Cognition Arousal/Alertness: Awake/alert Behavior During Therapy: WFL for tasks assessed/performed Overall Cognitive Status: Within Functional Limits for tasks assessed                      General Comments      Exercises General Exercises - Lower  Extremity Ankle Circles/Pumps: AROM;Both;10 reps;Seated Long Arc Quad: AROM;Both;10 reps;Seated Hip Flexion/Marching: AROM;Both;10 reps;Seated      Assessment/Plan    PT Assessment Patient needs continued PT services  PT Diagnosis Difficulty walking;Generalized weakness   PT Problem List Decreased strength;Decreased activity tolerance;Decreased balance;Cardiopulmonary status limiting activity  PT Treatment Interventions DME instruction;Gait training;Functional mobility training;Therapeutic activities;Therapeutic exercise;Balance training;Neuromuscular re-education;Patient/family education   PT Goals (Current goals can be found in the Care Plan section) Acute Rehab PT Goals Patient Stated Goal: to go home when able and to receive rehab PT Goal Formulation: With patient Time For Goal Achievement: 11/25/15 Potential to Achieve Goals: Good    Frequency Min 3X/week   Barriers to discharge Decreased caregiver support lives alone    Co-evaluation               End of Session Equipment Utilized During Treatment: Gait belt Activity Tolerance: Patient tolerated treatment well;Patient limited by fatigue Patient left: in chair;with call bell/phone within reach Nurse Communication: Mobility status;Other (comment) (SpO2)         Time: 1639-1700 PT Time Calculation (min) (ACUTE ONLY): 21 min   Charges:   PT Evaluation $PT Eval Low Complexity: 1 Procedure     PT G Codes:       Michail Jewels PT, DPT [phone] Pager: [phone] 11/11/2015, 5:18 PM

## 2015-11-11 NOTE — Progress Notes (Signed)
Patient Profile: Hx of CAD (s/p CABG in 1996 In 2011 had PCI/stent to SVG to diag; PCI / stent to SVG to intermedius) normal EF in 2013. Admitted 11/09/15 with acute CHF, treated with Lasix. 2D echo 11/10/15 demonstrates marked reduction in LVEF, now at 20-25% with mild to moderate MR noted + Grade 2DD.   Subjective: She feels better today. No resting dyspnea. No CP. She has not ambulated much since admission.   Objective: Vital signs in last 24 hours: Temp:  [98 F (36.7 C)-98.8 F (37.1 C)] 98 F (36.7 C) (01/28 0402) Pulse Rate:  [82-95] 88 (01/28 0402) Resp:  [18-27] 18 (01/28 0402) BP: (80-111)/(44-64) 80/61 mmHg (01/28 0402) SpO2:  [92 %-95 %] 95 % (01/28 0402) Weight:  [105 lb 6.4 oz (47.809 kg)] 105 lb 6.4 oz (47.809 kg) (01/28 0622) Last BM Date: 11/10/15  Intake/Output from previous day: 01/27 0701 - 01/28 0700 In: 963 [P.O.:960; I.V.:3] Out: 1250 [Urine:1250] Intake/Output this shift:    Medications Current Facility-Administered Medications  Medication Dose Route Frequency Provider Last Rate Last Dose  . 0.9 %  sodium chloride infusion  250 mL Intravenous PRN Pricilla Riffle, MD      . acetaminophen (TYLENOL) tablet 650 mg  650 mg Oral Q4H PRN Pricilla Riffle, MD   650 mg at 11/10/15 0957  . ALPRAZolam Prudy Feeler) tablet 0.125 mg  0.125 mg Oral TID PRN Pricilla Riffle, MD   0.125 mg at 11/10/15 1448  . ALPRAZolam Prudy Feeler) tablet 0.25 mg  0.25 mg Oral QHS PRN Thurmon Fair, MD   0.25 mg at 11/10/15 2151  . aspirin EC tablet 81 mg  81 mg Oral Daily Pricilla Riffle, MD   81 mg at 11/10/15 0948  . benzonatate (TESSALON) capsule 200 mg  200 mg Oral TID PRN Pricilla Riffle, MD   200 mg at 11/10/15 1800  . cholecalciferol (VITAMIN D) tablet 400 Units  400 Units Oral BID Pricilla Riffle, MD   400 Units at 11/10/15 2255  . clopidogrel (PLAVIX) tablet 75 mg  75 mg Oral Daily Pricilla Riffle, MD   75 mg at 11/10/15 0949  . diphenhydrAMINE (BENADRYL) 12.5 MG/5ML elixir 12.5 mg  12.5 mg Oral Q6H PRN  Pricilla Riffle, MD      . docusate sodium (COLACE) capsule 100 mg  100 mg Oral TID PRN Pricilla Riffle, MD      . enoxaparin (LOVENOX) injection 40 mg  40 mg Subcutaneous Q24H Pricilla Riffle, MD   40 mg at 11/10/15 0948  . ferrous sulfate tablet 325 mg  325 mg Oral Q breakfast Pricilla Riffle, MD   325 mg at 11/11/15 0751  . lactose free nutrition (BOOST PLUS) liquid 237 mL  237 mL Oral QPC lunch Dietrich Pates V, MD   237 mL at 11/10/15 1300  . mirabegron ER (MYRBETRIQ) tablet 50 mg  50 mg Oral Daily Pricilla Riffle, MD   50 mg at 11/10/15 0950  . multivitamin with minerals tablet 1 tablet  1 tablet Oral Daily Pricilla Riffle, MD   1 tablet at 11/10/15 0949  . ondansetron (ZOFRAN) injection 4 mg  4 mg Intravenous Q6H PRN Pricilla Riffle, MD      . pantoprazole (PROTONIX) EC tablet 40 mg  40 mg Oral Daily Pricilla Riffle, MD   40 mg at 11/10/15 2151  . sodium chloride flush (NS) 0.9 % injection 3 mL  3 mL Intravenous  Q12H Pricilla Riffle, MD   3 mL at 11/10/15 2152  . sodium chloride flush (NS) 0.9 % injection 3 mL  3 mL Intravenous PRN Pricilla Riffle, MD        PE: General appearance: alert, cooperative and no distress Neck: no carotid bruit and no JVD Lungs: mild left sided basilar rales Heart: regular rate and rhythm, S1, S2 normal, no murmur, click, rub or gallop Extremities: no LEE Pulses: 2+ and symmetric Skin: warm and dry Neurologic: Grossly normal  Lab Results:   Recent Labs  11/09/15 1609 11/10/15 0521  WBC 9.4 8.4  HGB 13.1 12.7  HCT 38.9 36.2  PLT 217 198   BMET  Recent Labs  11/10/15 1159 11/10/15 1730 11/11/15 0400  NA 130* 132* 133*  K 3.9 4.3 4.4  CL 93* 94* 96*  CO2 GLUCOSE 110* 118* 113*  BUN CREATININE 0.67 0.69 0.72  CALCIUM 8.6* 8.9 8.8*   Cardiac Panel (last 3 results)  Recent Labs  11/10/15 0044 11/10/15 0521 11/10/15 1159  TROPONINI 0.06* 0.06* 0.05*    Studies/Results: 2D Echo 11/10/15  Study Conclusions  - Left ventricle: The cavity size  was mildly dilated. Systolic function was severely reduced. The estimated ejection fraction was in the range of 20% to 25%. Severe diffuse hypokinesis with distinct regional wall motion abnormalities. Akinesis of the basal-midlateral and inferolateral myocardium. Features are consistent with a pseudonormal left ventricular filling pattern, with concomitant abnormal relaxation and increased filling pressure (grade 2 diastolic dysfunction). - Mitral valve: There was moderate regurgitation directed eccentrically and posteriorly. The acceleration rate of the regurgitant jet was reduced, consistent with a low dP/dt. - Left atrium: The atrium was mildly dilated. - Right ventricle: Systolic function was moderately reduced. - Tricuspid valve: There was mild-moderate regurgitation directed centrally. The acceleration rate of the regurgitant jet was reduced, consistent with a low dP/dt. - Pulmonary arteries: Systolic pressure was moderately increased. PA peak pressure: 49 mm Hg (S). - Pericardium, extracardiac: There was a left pleural effusion.  Impressions:  - Compared to 2013, there is severe deterioration in left ventricular function.   Assessment/Plan  Active Problems:   CHF (congestive heart failure) (HCC)   1. Acute Combined Systolic + Diastolic CHF: EF markedly reduced, compared to 2013, now at 20-25% with Grade 2 DD. EF previously 60%. Mild- moderate MR also noted. LEE improved but still with left sided rales at the base (mild). No dyspnea at rest. No supplemental O2 requirements. Her weight is back down to her baseline of 105 lb (111 lb on admit). Will have patient ambulate today with PT to assess functional status as well as exertional symptoms. BP is to soft for ACE-I currently.   2. Hypotension: BP at 0400 was 80/61. Will ask RN to recheck. No ACE or BB at this time. Will hold lasix for now.   3. CAD: s/p CABG in 1996 In 2011 had PCI/stent to SVG to  diag; PCI / stent to SVG to intermedius. She noted chest tightness on arrival but no CP currently. Troponin low level, flat trend, likely secondary to demand ischemia from acute CHF. Patient declines cath and wishes for medical management only.      LOS: 2 days    Treven Holtman M. Delmer Islam 11/11/2015 8:17 AM

## 2015-11-11 NOTE — Progress Notes (Signed)
Patient ID: Sabrina Moore, female   DOB: [birth year], 82 y.o.   MRN: [id number] S:feels a little better today O:BP 80/61 mmHg  Pulse 88  Temp(Src) 98 F (36.7 C) (Oral)  Resp 18  Ht  (1.575 m)  Wt 47.809 kg (105 lb 6.4 oz)  BMI 19.27 kg/m2  SpO2 95%  Intake/Output Summary (Last 24 hours) at 11/11/15 0901 Last data filed at 11/10/15 2355  Gross per 24 hour  Intake    723 ml  Output    950 ml  Net   -227 ml   Intake/Output: I/O last 3 completed shifts: In: 963 [P.O.:960; I.V.:3] Out: 2800 [Urine:2800]  Intake/Output this shift:    Weight change: 0.635 kg (1 lb 6.4 oz) Gen:WD elderly WF in NAD CVS:no rub Resp:bilateral rales WUJ:[id number] Ext:no edema   Recent Labs Lab 11/09/15 1609 11/10/15 0521 11/10/15 1159 11/10/15 1730 11/11/15 0400  NA 132* 129* 130* 132* 133*  K 4.4 3.4* 3.9 4.3 4.4  CL 100* 93* 93* 94* 96*  CO2 GLUCOSE 154* 117* 110* 118* 113*  BUN CREATININE 0.56 0.65 0.67 0.69 0.72  ALBUMIN  --   --   --  3.1* 3.1*  CALCIUM 8.8* 8.5* 8.6* 8.9 8.8*  PHOS  --   --   --   --  3.8  AST  --   --   --  31  --   ALT  --   --   --  31  --    Liver Function Tests:  Recent Labs Lab 11/10/15 1730 11/11/15 0400  AST 31  --   ALT 31  --   ALKPHOS 66  --   BILITOT 0.5  --   PROT 5.6*  --   ALBUMIN 3.1* 3.1*   No results for input(s): LIPASE, AMYLASE in the last 168 hours. No results for input(s): AMMONIA in the last 168 hours. CBC:  Recent Labs Lab 11/09/15 1609 11/10/15 0521  WBC 9.4 8.4  NEUTROABS 6.8  --   HGB 13.1 12.7  HCT 38.9 36.2  MCV 94.2 93.8  PLT 217 198   Cardiac Enzymes:  Recent Labs Lab 11/09/15 1609 11/10/15 0044 11/10/15 0521 11/10/15 1159  TROPONINI 0.04* 0.06* 0.06* 0.05*   CBG: No results for input(s): GLUCAP in the last 168 hours.  Iron Studies: No results for input(s): IRON, TIBC, TRANSFERRIN, FERRITIN in the last 72 hours. Studies/Results: Dg Chest 2 View  11/09/2015   CLINICAL DATA:  82 year old presenting with 1 week history of chest pain, cough, and chest congestion. Current history of hypertension and ischemic heart disease. Prior CABG. EXAM: CHEST  2 VIEW COMPARISON:  11/06/2015 and earlier. FINDINGS: Sternotomy for CABG. Cardiac silhouette moderately enlarged. Interval development of interstitial pulmonary edema since the examination 3 days ago. Biapical pleuroparenchymal scarring, right greater than left. New small bilateral pleural effusions. Mild compression fracture of the upper endplate of T10 with slight kyphous deformity, unchanged. IMPRESSION: Mild CHF, with moderate cardiomegaly and mild diffuse interstitial pulmonary edema associated with small bilateral pleural effusions. Electronically Signed   By: Hulan Saas M.D.   On: 11/09/2015 16:44   . aspirin EC  81 mg Oral Daily  . cholecalciferol  400 Units Oral BID  . clopidogrel  75 mg Oral Daily  . enoxaparin (LOVENOX) injection  40 mg Subcutaneous Q24H  . ferrous sulfate  325 mg Oral Q breakfast  . lactose free nutrition  237 mL Oral QPC lunch  . mirabegron ER  50 mg Oral Daily  . multivitamin with minerals  1 tablet Oral Daily  . pantoprazole  40 mg Oral Daily  . sodium chloride flush  3 mL Intravenous Q12H    BMET    Component Value Date/Time   NA 133* 11/11/2015 0400   K 4.4 11/11/2015 0400   CL 96* 11/11/2015 0400   CO2 29 11/11/2015 0400   GLUCOSE 113* 11/11/2015 0400   BUN 16 11/11/2015 0400   CREATININE 0.72 11/11/2015 0400   CALCIUM 8.8* 11/11/2015 0400   GFRNONAA >60 11/11/2015 0400   GFRAA >60 11/11/2015 0400   CBC    Component Value Date/Time   WBC 8.4 11/10/2015 0521   RBC 3.86* 11/10/2015 0521   RBC 3.94 06/13/2010 2013   HGB 12.7 11/10/2015 0521   HCT 36.2 11/10/2015 0521   PLT 198 11/10/2015 0521   MCV 93.8 11/10/2015 0521   MCH 32.9 11/10/2015 0521   MCHC 35.1 11/10/2015 0521   RDW 13.3 11/10/2015 0521   LYMPHSABS 1.4 11/09/2015 1609   MONOABS 1.0  11/09/2015 1609   EOSABS 0.2 11/09/2015 1609   BASOSABS 0.0 11/09/2015 1609     Assessment/Plan: 1. Hyponatremia- has been chronic issue since 2011 and initially thought to be related to Celexa or SIADH, but re-evaluated in 2015 by [facility] and more consistent with ADH effect and reset osmostat. Currently her hyponatremia is most likely related to CHF/volume overload and pulmonary issues.  1. Continues to slowly improve 2. Remains hypochloremic and may require IV NS and IV Lasix to help increase Na and Cl levels 3. UNa high at 68, Uosm inappropriately high at 477, Sosm 274, TSH WNL, and Cortisol was low at 5.  Is not SIADH due to volume overload, however she is not able to maximally dilute urine possibly related to ADH effect and reset osmostat.  Adrenal insufficiency is also a consideration and would recommend cosyntropin stim test before starting hydrocortisone. 4. Continue to hold sodium tabs for now and follow with diuretics alone and water restriction of daily 5. She may require some sodium chloride IV with lasix due to inability to maximally dilute urine but will require careful titration. 6. Also unsure if she has protein malnutrition which could contribute to inability to maximally dilute urine. Will check albumin tomorrow. 7. Orthostatic BP's were normal so not the likely cause of her dizziness 2. CHF- diuresed well 1.7L over the last 24 hours but still has rales. Will continue with diuresis per Cardiology and discussed with patient and daughter that we will continue to hold sodium tabs until she is re-evaluated with urine studies. Agree with current management and await ECHO results 3. Back pain- due to compression fx of T10 4. Dispo- not stable for discharge at this time. Pt is currently in independent living and would like to continue to do so. She may require PT for deconditioning.  Sabrina Moore

## 2015-11-11 NOTE — Progress Notes (Signed)
Pharmacist Heart Failure Core Measure Documentation  Assessment: Sabrina Moore has an EF documented as 20-25% on 1/27 by echo.  Rationale: Heart failure patients with left ventricular systolic dysfunction (LVSD) and an EF < 40% should be prescribed an angiotensin converting enzyme inhibitor (ACEI) or angiotensin receptor blocker (ARB) at discharge unless a contraindication is documented in the medical record.  This patient is not currently on an ACEI or ARB for HF.  This note is being placed in the record in order to provide documentation that a contraindication to the use of these agents is present for this encounter.  ACE Inhibitor or Angiotensin Receptor Blocker is contraindicated (specify all that apply)    ACEI allergy AND ARB allergy   Angioedema   Moderate or severe aortic stenosis   Hyperkalemia   Hypotension   Renal artery stenosis   Worsening renal function, preexisting renal disease or dysfunction   [name] 11/11/2015 2:57 PM

## 2015-11-12 LAB — ACTH STIMULATION, 3 TIME POINTS
CORTISOL 30 MIN: 24.2 ug/dL
Cortisol, 60 Min: 31.1 ug/dL
Cortisol, Base: 10.4 ug/dL

## 2015-11-12 LAB — OSMOLALITY, URINE: Osmolality, Ur: 473 mOsm/kg (ref 300–900)

## 2015-11-12 LAB — RENAL FUNCTION PANEL
ALBUMIN: 3.3 g/dL — AB (ref 3.5–5.0)
Albumin: 3.1 g/dL — ABNORMAL LOW (ref 3.5–5.0)
Anion gap: 11 (ref 5–15)
Anion gap: 8 (ref 5–15)
BUN: 13 mg/dL (ref 6–20)
BUN: 14 mg/dL (ref 6–20)
CALCIUM: 9 mg/dL (ref 8.9–10.3)
CHLORIDE: 92 mmol/L — AB (ref 101–111)
CO2: 30 mmol/L (ref 22–32)
CO2: 27 mmol/L (ref 22–32)
CREATININE: 0.63 mg/dL (ref 0.44–1.00)
CREATININE: 0.61 mg/dL (ref 0.44–1.00)
Calcium: 8.9 mg/dL (ref 8.9–10.3)
Chloride: 96 mmol/L — ABNORMAL LOW (ref 101–111)
GFR calc Af Amer: >60 mL/min (ref >60)
GFR calc non Af Amer: >60 mL/min (ref >60)
Glucose, Bld: 119 mg/dL — ABNORMAL HIGH (ref 65–99)
Glucose, Bld: 115 mg/dL — ABNORMAL HIGH (ref 65–99)
PHOSPHORUS: 3.7 mg/dL (ref 2.5–4.6)
POTASSIUM: 4.4 mmol/L (ref 3.5–5.1)
Phosphorus: 4.1 mg/dL (ref 2.5–4.6)
Potassium: 4.2 mmol/L (ref 3.5–5.1)
SODIUM: 131 mmol/L — AB (ref 135–145)
Sodium: 133 mmol/L — ABNORMAL LOW (ref 135–145)

## 2015-11-12 LAB — URIC ACID: Uric Acid, Serum: 4.2 mg/dL (ref 2.3–6.6)

## 2015-11-12 LAB — OSMOLALITY: OSMOLALITY: 273 mosm/kg — AB (ref 275–295)

## 2015-11-12 LAB — SODIUM, URINE, RANDOM: SODIUM UR: 20 mmol/L

## 2015-11-12 MED ORDER — POLYETHYLENE GLYCOL 3350 17 G PO PACK
17.0000 g | PACK | Freq: Every day | ORAL | Status: DC
  Administered 2015-11-12 – 2015-11-15 (×4): 17 g via ORAL
  Filled 2015-11-12 (×4): qty 1

## 2015-11-12 MED ORDER — SPIRONOLACTONE 25 MG PO TABS
12.5000 mg | ORAL_TABLET | Freq: Every day | ORAL | Status: DC
  Administered 2015-11-12 – 2015-11-15 (×4): 12.5 mg via ORAL
  Filled 2015-11-12 (×4): qty 1

## 2015-11-12 MED ORDER — FUROSEMIDE 40 MG PO TABS
40.0000 mg | ORAL_TABLET | Freq: Every day | ORAL | Status: DC
  Administered 2015-11-12: 40 mg via ORAL
  Filled 2015-11-12: qty 1

## 2015-11-12 NOTE — Progress Notes (Signed)
       Patient Name: Sabrina Moore Date of Encounter: 11/12/2015    SUBJECTIVE: Elderly and somewhat frail 82 year old who actually looks better than her descriptions in the chart. She denies chest discomfort. Cough is still present. Dyspnea is decreasing.  There is concern about hyponatremia. Has been discussion about palliative care. LV function is significantly decreased by echo. Etiology is uncertain.  TELEMETRY:  Normal sinus rhythm. Filed Vitals:   11/11/15 1408 11/11/15 1500 11/11/15 2146 11/12/15 0507  BP: 103/56 102/48 119/57 96/43  Pulse: 92  96 84  Temp: 98.1 F (36.7 C)  98.1 F (36.7 C) 98.8 F (37.1 C)  TempSrc: Oral  Oral Oral  Resp: 18   17  Height:      Weight:    103 lb 13.4 oz (47.1 kg)  SpO2: 95%  95% 97%    Intake/Output Summary (Last 24 hours) at 11/12/15 1230 Last data filed at 11/12/15 1009  Gross per 24 hour  Intake    960 ml  Output   1800 ml  Net   -840 ml   LABS: Basic Metabolic Panel:  Recent Labs  16/10/96 0715 11/12/15 0950  NA 133* 131*  K 4.2 4.4  CL 92* 96*  CO2 30 27  GLUCOSE 119* 115*  BUN 13 14  CREATININE 0.63 0.61  CALCIUM 8.9 9.0  PHOS 4.1 3.7   CBC:  Recent Labs  11/09/15 1609 11/10/15 0521  WBC 9.4 8.4  NEUTROABS 6.8  --   HGB 13.1 12.7  HCT 38.9 36.2  MCV 94.2 93.8  PLT 217 198   Cardiac Enzymes:  Recent Labs  11/10/15 0044 11/10/15 0521 11/10/15 1159  TROPONINI 0.06* 0.06* 0.05*   BNP    Component Value Date/Time   BNP 1231.2* 11/09/2015 1609    ProBNP    Component Value Date/Time   PROBNP 121.0* 03/28/2010 0907      Radiology/Studies:  11/09/15 chest x-ray IMPRESSION: Mild CHF, with moderate cardiomegaly and mild diffuse interstitial pulmonary edema associated with small bilateral pleural effusions.  Physical Exam: Blood pressure 96/43, pulse 84, temperature 98.8 F (37.1 C), temperature source Oral, resp. rate 17, height  (1.575 m), weight 103 lb 13.4 oz (47.1 kg), SpO2  97 %. Weight change: -1 lb 9 oz (-0.709 kg)  Wt Readings from Last 3 Encounters:  11/12/15 103 lb 13.4 oz (47.1 kg)  11/09/15 111 lb 6.4 oz (50.531 kg)  08/30/15 105 lb 1.9 oz (47.682 kg)   Mild elevation and external jugular veins Persistent faint rales at the bases bilaterally Cardiac exam reveals a soft S3 Extremities reveal no edema  ASSESSMENT: 1. New systolic heart failure in a patient with known history of coronary artery disease and previous bypass surgery 2. Hyponatremia 3. Hypotension, persistent  Plan:  1. Difficult to establish a good heart failure regimen with relatively low blood pressures 2. Add low-dose Aldactone as a component of a heart failure strategy. Angiotensin receptor blocker/ACE therapy and beta blocker therapy are not currently appropriate due to blood pressure concerns. 3. Adjust heart failure regimen  4. At her age, I do not believe an aggressive evaluation for coronary disease progression is indicated in the absence of ischemic symptoms. ` Long-term prognosis is relatively poor considering her age and comorbidities.  Sabrina Moore 11/12/2015, 12:30 PM

## 2015-11-12 NOTE — Progress Notes (Signed)
Patient ID: Sabrina Moore, female   DOB: March 19, 1927, 82 y.o.   MRN: [id number] S:feels better today O:BP 96/43 mmHg  Pulse 84  Temp(Src) 98.8 F (37.1 C) (Oral)  Resp 17  Ht  (1.575 m)  Wt 47.1 kg (103 lb 13.4 oz)  BMI 18.99 kg/m2  SpO2 97%  Intake/Output Summary (Last 24 hours) at 11/12/15 0905 Last data filed at 11/12/15 0841  Gross per 24 hour  Intake   1200 ml  Output   1850 ml  Net   -650 ml   Intake/Output: I/O last 3 completed shifts: In: 963 [P.O.:960; I.V.:3] Out: 2400 [Urine:2400]  Intake/Output this shift:  Total I/O In: 240 [P.O.:240] Out: -  Weight change: -0.709 kg (-1 lb 9 oz) QVZ:DGLOV elderly WF in NAd CVS:no rub Resp:crackles L lung field > Right FIE:PPIRJJ Ext:no edema   Recent Labs Lab 11/09/15 1609 11/10/15 0521 11/10/15 1159 11/10/15 1730 11/11/15 0400 11/12/15 0715 11/12/15 0950  NA 132* 129* 130* 132* 133* 133* 131*  K 4.4 3.4* 3.9 4.3 4.4 4.2 4.4  CL 100* 93* 93* 94* 96* 92* 96*  CO2 GLUCOSE 154* 117* 110* 118* 113* 119* 115*  BUN CREATININE 0.56 0.65 0.67 0.69 0.72 0.63 0.61  ALBUMIN  --   --   --  3.1* 3.1* 3.1* 3.3*  CALCIUM 8.8* 8.5* 8.6* 8.9 8.8* 8.9 9.0  PHOS  --   --   --   --  3.8 4.1 3.7  AST  --   --   --  31  --   --   --   ALT  --   --   --  31  --   --   --    Liver Function Tests:  Recent Labs Lab 11/10/15 1730 11/11/15 0400 11/12/15 0715  AST 31  --   --   ALT 31  --   --   ALKPHOS 66  --   --   BILITOT 0.5  --   --   PROT 5.6*  --   --   ALBUMIN 3.1* 3.1* 3.1*   No results for input(s): LIPASE, AMYLASE in the last 168 hours. No results for input(s): AMMONIA in the last 168 hours. CBC:  Recent Labs Lab 11/09/15 1609 11/10/15 0521  WBC 9.4 8.4  NEUTROABS 6.8  --   HGB 13.1 12.7  HCT 38.9 36.2  MCV 94.2 93.8  PLT 217 198   Cardiac Enzymes:  Recent Labs Lab 11/09/15 1609 11/10/15 0044 11/10/15 0521 11/10/15 1159  TROPONINI 0.04* 0.06* 0.06*  0.05*   CBG: No results for input(s): GLUCAP in the last 168 hours.  Iron Studies: No results for input(s): IRON, TIBC, TRANSFERRIN, FERRITIN in the last 72 hours. Studies/Results: No results found. Marland Kitchen aspirin EC  81 mg Oral Daily  . cholecalciferol  400 Units Oral BID  . clopidogrel  75 mg Oral Daily  . enoxaparin (LOVENOX) injection  40 mg Subcutaneous Q24H  . ferrous sulfate  325 mg Oral Q breakfast  . furosemide  40 mg Intravenous Daily  . lactose free nutrition  237 mL Oral QPC lunch  . mirabegron ER  50 mg Oral Daily  . multivitamin with minerals  1 tablet Oral Daily  . pantoprazole  40 mg Oral Daily  . sodium chloride flush  3 mL Intravenous Q12H    BMET    Component Value Date/Time  NA 133* 11/12/2015 0715   K 4.2 11/12/2015 0715   CL 92* 11/12/2015 0715   CO2 30 11/12/2015 0715   GLUCOSE 119* 11/12/2015 0715   BUN 13 11/12/2015 0715   CREATININE 0.63 11/12/2015 0715   CALCIUM 8.9 11/12/2015 0715   GFRNONAA >60 11/12/2015 0715   GFRAA >60 11/12/2015 0715   CBC    Component Value Date/Time   WBC 8.4 11/10/2015 0521   RBC 3.86* 11/10/2015 0521   RBC 3.94 06/13/2010 2013   HGB 12.7 11/10/2015 0521   HCT 36.2 11/10/2015 0521   PLT 198 11/10/2015 0521   MCV 93.8 11/10/2015 0521   MCH 32.9 11/10/2015 0521   MCHC 35.1 11/10/2015 0521   RDW 13.3 11/10/2015 0521   LYMPHSABS 1.4 11/09/2015 1609   MONOABS 1.0 11/09/2015 1609   EOSABS 0.2 11/09/2015 1609   BASOSABS 0.0 11/09/2015 1609     Assessment/Plan: 1. Hyponatremia- has been chronic issue since 2011 and initially thought to be related to Celexa or SIADH, but re-evaluated in 2015 by [facility] and more consistent with ADH effect and reset osmostat. Currently her hyponatremia is most likely related to CHF/volume overload and pulmonary issues.  1. Continues to slowly improve 2. Remains hypochloremic and may require IV NS and IV Lasix to help increase Na and Cl levels 3. UNa high at 68, Uosm inappropriately high  at 477, Sosm 274, TSH WNL, and Cortisol was low at 5. Is not SIADH due to volume overload, however she is not able to maximally dilute urine possibly related to ADH effect and reset osmostat. Adrenal insufficiency is also a consideration and would recommend cosyntropin stim test before starting hydrocortisone. 4. Continue to hold sodium tabs for now and follow with diuretics alone and water restriction of daily 5. She may require some sodium chloride IV with lasix due to inability to maximally dilute urine but will require careful titration. 6. Also unsure if she may have a pulmonary process contributing and thus causing SIADH due to persistent crackles in left lung.  Discussed high resolution CT scan without contrast with Dr. Rennis Golden who will discuss with patient. 7. Has severe CMP with EF of only 20-25%.  Persistent hyponatremia is a poor prognostic indicator and she is amenable to Palliative care consult. 8. Lasix held but given drop in Na would restart today and see if she tolerates po. 9. Orthostatic BP's were normal so not the likely cause of her dizziness 10. Will recheck Urine studies today and re-evaluate management (she should stay off of NaCl tabs given poor EF and decompensated CHF) 2. CHF- diuresed well over the last 24 hours but still has rales. Will continue with diuresis per Cardiology and discussed with patient and daughter that we will continue to hold sodium tabs until she is re-evaluated with urine studies. Agree with current management and Palliative care consult due to EF of only 20-25% 3. Back pain- due to compression fx of T10 4. Dispo- not stable for discharge at this time. Pt is currently in independent living and would like to continue to do so. She may require PT for deconditioning and transition to assisted living or hospice.  [name] [name]

## 2015-11-13 ENCOUNTER — Encounter (HOSPITAL_COMMUNITY): Payer: Self-pay | Admitting: Physician Assistant

## 2015-11-13 DIAGNOSIS — I952 Hypotension due to drugs: Secondary | ICD-10-CM

## 2015-11-13 DIAGNOSIS — I509 Heart failure, unspecified: Secondary | ICD-10-CM

## 2015-11-13 LAB — RENAL FUNCTION PANEL
ALBUMIN: 3.2 g/dL — AB (ref 3.5–5.0)
Anion gap: 10 (ref 5–15)
BUN: 13 mg/dL (ref 6–20)
CO2: 30 mmol/L (ref 22–32)
Calcium: 9 mg/dL (ref 8.9–10.3)
Chloride: 94 mmol/L — ABNORMAL LOW (ref 101–111)
Creatinine, Ser: 0.76 mg/dL (ref 0.44–1.00)
GFR calc Af Amer: >60 mL/min (ref >60)
GFR calc non Af Amer: >60 mL/min (ref >60)
GLUCOSE: 117 mg/dL — AB (ref 65–99)
PHOSPHORUS: 3.8 mg/dL (ref 2.5–4.6)
POTASSIUM: 4.1 mmol/L (ref 3.5–5.1)
SODIUM: 134 mmol/L — AB (ref 135–145)

## 2015-11-13 MED ORDER — FUROSEMIDE 40 MG PO TABS
40.0000 mg | ORAL_TABLET | Freq: Two times a day (BID) | ORAL | Status: DC
  Administered 2015-11-13 – 2015-11-15 (×4): 40 mg via ORAL
  Filled 2015-11-13 (×4): qty 1

## 2015-11-13 MED ORDER — FUROSEMIDE 10 MG/ML IJ SOLN
40.0000 mg | Freq: Once | INTRAMUSCULAR | Status: AC
  Administered 2015-11-13: 40 mg via INTRAVENOUS
  Filled 2015-11-13: qty 4

## 2015-11-13 NOTE — Progress Notes (Signed)
Patient Name: Sabrina Moore Date of Encounter: 11/13/2015     Principal Problem:   Acute systolic (congestive) heart failure (HCC) Active Problems:   Hyponatremia   S/P CABG (coronary artery bypass graft)   Hypotension   CHF (congestive heart failure) (HCC)    SUBJECTIVE  Slept elevated last night. Still with dry cough. No Cp  CURRENT MEDS . aspirin EC  81 mg Oral Daily  . cholecalciferol  400 Units Oral BID  . clopidogrel  75 mg Oral Daily  . enoxaparin (LOVENOX) injection  40 mg Subcutaneous Q24H  . ferrous sulfate  325 mg Oral Q breakfast  . furosemide  40 mg Oral Daily  . lactose free nutrition  237 mL Oral QPC lunch  . mirabegron ER  50 mg Oral Daily  . multivitamin with minerals  1 tablet Oral Daily  . pantoprazole  40 mg Oral Daily  . polyethylene glycol  17 g Oral Daily  . sodium chloride flush  3 mL Intravenous Q12H  . spironolactone  12.5 mg Oral Daily    OBJECTIVE  Filed Vitals:   11/12/15 0507 11/12/15 1405 11/12/15 2115 11/13/15 0500  BP: 96/43 108/62 116/67 103/59  Pulse: 84 80 88 84  Temp: 98.8 F (37.1 C) 98.5 F (36.9 C) 98.5 F (36.9 C) 98.3 F (36.8 C)  TempSrc: Oral Oral Oral Oral  Resp: 17 20    Height:      Weight: 103 lb 13.4 oz (47.1 kg)   103 lb 12.8 oz (47.083 kg)  SpO2: 97% 95% 95% 98%    Intake/Output Summary (Last 24 hours) at 11/13/15 0720 Last data filed at 11/13/15 0646  Gross per 24 hour  Intake    480 ml  Output   1425 ml  Net   -945 ml   Filed Weights   11/11/15 0622 11/12/15 0507 11/13/15 0500  Weight: 105 lb 6.4 oz (47.809 kg) 103 lb 13.4 oz (47.1 kg) 103 lb 12.8 oz (47.083 kg)    PHYSICAL EXAM  General: Pleasant, NAD. Neuro: Alert and oriented X 3. Moves all extremities spontaneously. Psych: Normal affect. HEENT:  Normal  Neck: Supple without bruits or JVD. Lungs:  Resp regular and unlabored, bibasilar rales Heart: RRR no s3, s4, or murmurs. Abdomen: Soft, non-tender, non-distended, BS + x 4.    Extremities: No clubbing, cyanosis or edema. DP/PT/Radials 2+ and equal bilaterally.  Accessory Clinical Findings  CBC No results for input(s): WBC, NEUTROABS, HGB, HCT, MCV, PLT in the last 72 hours. Basic Metabolic Panel  Recent Labs  11/12/15 0950 11/13/15 0526  NA 131* 134*  K 4.4 4.1  CL 96* 94*  CO2 27 30  GLUCOSE 115* 117*  BUN 14 13  CREATININE 0.61 0.76  CALCIUM 9.0 9.0  PHOS 3.7 3.8   Liver Function Tests  Recent Labs  11/10/15 1730  11/12/15 0950 11/13/15 0526  AST 31  --   --   --   ALT 31  --   --   --   ALKPHOS 66  --   --   --   BILITOT 0.5  --   --   --   PROT 5.6*  --   --   --   ALBUMIN 3.1*  < > 3.3* 3.2*  < > = values in this interval not displayed. No results for input(s): LIPASE, AMYLASE in the last 72 hours. Cardiac Enzymes  Recent Labs  11/10/15 1159  TROPONINI 0.05*    Thyroid Function  Tests  Recent Labs  11/10/15 1730  TSH 1.158    TELE  NSR with LBBB and freq PVCS  Radiology/Studies  Dg Chest 2 View  11/09/2015  CLINICAL DATA:  82 year old presenting with 1 week history of chest pain, cough, and chest congestion. Current history of hypertension and ischemic heart disease. Prior CABG. EXAM: CHEST  2 VIEW COMPARISON:  11/06/2015 and earlier. FINDINGS: Sternotomy for CABG. Cardiac silhouette moderately enlarged. Interval development of interstitial pulmonary edema since the examination 3 days ago. Biapical pleuroparenchymal scarring, right greater than left. New small bilateral pleural effusions. Mild compression fracture of the upper endplate of T10 with slight kyphous deformity, unchanged. IMPRESSION: Mild CHF, with moderate cardiomegaly and mild diffuse interstitial pulmonary edema associated with small bilateral pleural effusions. Electronically Signed   By: Hulan Saas M.D.   On: 11/09/2015 16:44   Dg Chest 2 View  11/06/2015  CLINICAL DATA:  Shortness of breath with cough and congestion EXAM: CHEST  2 VIEW COMPARISON:   April 20, 2014 FINDINGS: There is no edema or consolidation. The heart size and pulmonary vascularity are normal. No adenopathy. Patient is status post coronary artery bypass grafting. No adenopathy. There is degenerative change in the thoracic spine. Mild anterior wedging of the T10 vertebral body is stable. IMPRESSION: No edema or consolidation. Electronically Signed   By: Bretta Bang III M.D.   On: 11/06/2015 10:48    ASSESSMENT AND PLAN Sabrina Moore is a 82 y.o. female with a history of CAD (s/p CABG in 1996; PCI/stent to SVG to diag; PCI / stent to SVG to intermedius in 2011), HTN, and HLD who was admitted 11/09/15 with acute CHF, treated with Lasix. 2D echo 11/10/15 demonstrates marked reduction in LVEF, now at 20-25% with mild to moderate MR noted + Grade 2DD (previously normal LVEF).   New acute systolic/diastolic CHF: EF now 20-25%, was normal in 2013. G2DD. Mid-mod MR. -- Diuresis and medical management limited by hypotension. Started on spiro 12.5mg  daily. No ACE/ARB or BB due to hypotension. Continue lasix  daily. Net net 3.5L. She has persistant crackles and orthopnea. I am going to give her one more dose of IV  lasix now.   Hyponatremia: has been chronic issue since 2011 and initially thought to be related to Celexa or SIADH, but re-evaluated in 2015 by [facility] and more consistent with ADH effect and reset osmostat. Currently her hyponatremia is most likely related to CHF/volume overload and pulmonary issues. Neprology recommended possible saline with diuresis to aid in hyponatremia. Adrenal insufficiency is also a consideration and would recommend cosyntropin stim test before starting hydrocortisone. ACTH levels do not appear to be abnormal. Per Dr. Malachi Bonds "Also unsure if she may have a pulmonary process contributing and thus causing SIADH due to persistent crackles in left lung. Discussed high resolution CT scan without contrast." Patient was asking about this this AM. Will  discuss with Dr. Duke Salvia  Hypotension: BP improving but still soft at 103/59. Continue sprio 12.5mg  daily. Will give one dose IV lasix as above. Watch BP carefully.   CAD: s/p CABG in 1996 In 2011 had PCI/stent to SVG to diag; PCI / stent to SVG to intermedius. She noted chest tightness on arrival but no CP currently. Troponin low level, flat trend, likely secondary to demand ischemia from acute CHF. Patient declines cath and wishes for medical management only. -- Continue medical management with ASA/plavix. Intolerant to statins. No BB due to hypotension.  Dispo: per Dr. Rennis Golden- calling a palliative  care consult. I don;t see that this was done. I have called their consult line and left a message. Patient and daughter have many questions about what she can do at home and her medications and whether or not she will need a hospital bed. She currently lives at an independant living facility   Signed, Allena Katz  Pager [phone]

## 2015-11-13 NOTE — Progress Notes (Signed)
Patient ID: Sabrina Moore, female   DOB: [birth year], 82 y.o.   MRN: [id number]  [facility] Progress Note   Assessment/ Plan:   1. Hyponatremia: Appears to be multifactorial from SIADH/reset Osmostat and exacerbated by CHF decompensation. Fortunately, sodium chloride supplement stopped after she was admitted with CHF decompensation and furosemide dose titrated upwards. No evidence of adrenal insufficiency based on cosyntropin stim test. Would recommend continuing to titrate furosemide as is currently being done and I would recommend avoid ACE inhibitors due to propensity for worsening hyponatremia (through interference of the Renin/aldosterone axis particularly while she is on spironolactone). If her blood pressure started trending down-would decrease spironolactone dose.  2. CHF exacerbation: appears to be clinically improving with good urine output overnight of 1.4 L- but net negative weight unchanged overnight. Would recommend increasing furosemide 40 mg twice a day for 48-72 hours given persistent crackles on lung exam. 3. Chronic back pain: due to compression fracture of T10-ongoing pain control 4. Disposition: The plan is for discharge back to the assisted living facility where she currently resides with escalation of assistance if needed.   Subjective:   Reports to be feeling better-denies any chest pain, nausea or vomiting. Still having some "congestion in her throat"   Objective:   BP 103/59 mmHg  Pulse 84  Temp(Src) 98.3 F (36.8 C) (Oral)  Resp 20  Ht  (1.575 m)  Wt 47.083 kg (103 lb 12.8 oz)  BMI 18.98 kg/m2  SpO2 98%  Intake/Output Summary (Last 24 hours) at 11/13/15 0840 Last data filed at 11/13/15 0836  Gross per 24 hour  Intake    700 ml  Output   1425 ml  Net   -725 ml   Weight change: -0.017 kg (-0.6 oz)  Physical Exam: Gen: comfortably resting in bed, daughter at bedside CVS: pulse regular in rhythm, normal rate, S1 and S2 normal Resp: fine  rales bibasally Abd: soft, flat, nontender Ext: no lower extremity edema  Imaging: No results found.  Labs: BMET  Recent Labs Lab 11/10/15 0521 11/10/15 1159 11/10/15 1730 11/11/15 0400 11/12/15 0715 11/12/15 0950 11/13/15 0526  NA 129* 130* 132* 133* 133* 131* 134*  K 3.4* 3.9 4.3 4.4 4.2 4.4 4.1  CL 93* 93* 94* 96* 92* 96* 94*  CO2 GLUCOSE 117* 110* 118* 113* 119* 115* 117*  BUN CREATININE 0.65 0.67 0.69 0.72 0.63 0.61 0.76  CALCIUM 8.5* 8.6* 8.9 8.8* 8.9 9.0 9.0  PHOS  --   --   --  3.8 4.1 3.7 3.8   CBC  Recent Labs Lab 11/09/15 1609 11/10/15 0521  WBC 9.4 8.4  NEUTROABS 6.8  --   HGB 13.1 12.7  HCT 38.9 36.2  MCV 94.2 93.8  PLT 217 198   Medications:    . aspirin EC  81 mg Oral Daily  . cholecalciferol  400 Units Oral BID  . clopidogrel  75 mg Oral Daily  . enoxaparin (LOVENOX) injection  40 mg Subcutaneous Q24H  . ferrous sulfate  325 mg Oral Q breakfast  . furosemide  40 mg Oral Daily  . lactose free nutrition  237 mL Oral QPC lunch  . mirabegron ER  50 mg Oral Daily  . multivitamin with minerals  1 tablet Oral Daily  . pantoprazole  40 mg Oral Daily  . polyethylene glycol  17 g Oral Daily  . sodium chloride flush  3 mL Intravenous Q12H  . spironolactone  12.5 mg Oral Daily   Zetta Bills, MD 11/13/2015, 8:40 AM

## 2015-11-13 NOTE — Consult Note (Signed)
Consultation Note Date: 11/13/2015   Patient Name: Sabrina Moore  DOB: June 23, 1927  MRN: [id number]  Age / Sex: 82 y.o., female  PCP: Josetta Huddle, MD Referring Physician: Fay Records, MD  Reason for Consultation: Establishing goals of care    Clinical Assessment/Narrative: I met today with Sabrina Moore along with her 2 daughters, Sabrina Moore and Sabrina Moore. We discussed palliative care vs hospice and their goals. They explain the distress they felt going from doctor office to doctor office and then Sabrina Moore driving her mother to the ED while she was gasping for breath - they are distressed by the events that brought her to the hospital. What they are looking for is a way to avoid this distress. Unfortunately palliative care as an outpatient is fairly limited at this time - there is a Scientist, physiological via [facility] providing outpatient palliative services that I will get them connected with but I do not know at this time the extent of this program or the specifics of what they can and cannot do for them in the home (i.e. lab draws, frequency of visits, etc). This would be in addition to any home health services. They agree to find out more about this program.  We did spend time discussing the trajectory of heart failure especially with her other medical issues (chronic hyponatremia) and that the path is quite complicated and unpredictable at times. We discussed more comfort options and when that time comes what hospice can and cannot do in the home. She is not hospice appropriate at this time but I did explain that her health could easily swing to where she was appropriate for hospice if they desire. They definitely want to focus on QOL and she does not want heroics and to avoid hospitalizations but they also want to treat the treatable at this time understandably. All questions/concerns addressed to the best of my ability.   Contacts/Participants  in Discussion: Primary Decision Maker: Sabrina Moore   Relationship to Patient daughter HCPOA: yes   SUMMARY OF RECOMMENDATIONS - Focus on QOL and treating the treatable - Outpatient palliative care referral via [place] with home health (independent living) when stable  Code Status/Advance Care Planning: DNR - pt/family confirm  Other Directives:Advanced Directive - completed but we do not have a copy  Symptom Management:   Currently her only complaint is frequent urination related to her Lasix. Denies shortness of breath even with activity.   Palliative Prophylaxis:   Bowel Regimen, Delirium Protocol and Frequent Pain Assessment  Additional Recommendations (Limitations, Scope, Preferences):  Avoid Hospitalization  Psycho-social/Spiritual:  Support System: Strong Desire for further Chaplaincy support:no Additional Recommendations: Caregiving  Support/Resources and Education on Hospice  Prognosis: Unable to determine  Discharge Planning: Home with Palliative Services   Chief Complaint/ Primary Diagnoses: Present on Admission:  . Acute systolic (congestive) heart failure ([place]) . Hyponatremia  I have reviewed the medical record, interviewed the patient and family, and examined the patient. The following aspects are pertinent.  Past Medical History  Diagnosis Date  . CAD (coronary artery disease)     Stents placed in 2012?0-on plavix and asa   . Dyslipidemia     Intolerant to statins  . [facility]ovagal syncope     [facility]  . IDA (iron deficiency anemia)     NO scopes recently? was controverial  . IBS (irritable bowel syndrome)     Seen by Dr. Samara Deist Jan 2013-advised not to do this  . Anxiety   .  Hypertension   . Hypercholesterolemia   . Shingles     End of august 2012-post-herpeticv Neuralgia   Social History   Social History  . Marital Status: Widowed    Spouse Name: N/A  . Number of Children: N/A  . Years of Education: N/A   Social  History Main Topics  . Smoking status: Never Smoker   . Smokeless tobacco: None  . Alcohol Use: No  . Drug Use: No  . Sexual Activity: Not Asked   Other Topics Concern  . None   Social History Narrative   Catering manager for a bit.   Is from [place]- came in [phone] to this at[place]      Sabrina Moore (c) [phone]   Family History  Problem Relation Age of Onset  . Breast cancer Mother   . Ovarian cancer Mother   . Cancer Mother 53    ovarian  . Heart disease Father     66 at age of death  . Arthritis Father    Scheduled Meds: . aspirin EC  81 mg Oral Daily  . cholecalciferol  400 Units Oral BID  . clopidogrel  75 mg Oral Daily  . enoxaparin (LOVENOX) injection  40 mg Subcutaneous Q24H  . ferrous sulfate  325 mg Oral Q breakfast  . furosemide  40 mg Oral BID  . lactose free nutrition  237 mL Oral QPC lunch  . mirabegron ER  50 mg Oral Daily  . multivitamin with minerals  1 tablet Oral Daily  . pantoprazole  40 mg Oral Daily  . polyethylene glycol  17 g Oral Daily  . sodium chloride flush  3 mL Intravenous Q12H  . spironolactone  12.5 mg Oral Daily   Continuous Infusions:  PRN Meds:.sodium chloride, acetaminophen, ALPRAZolam, ALPRAZolam, benzonatate, diphenhydrAMINE, docusate sodium, ondansetron (ZOFRAN) IV, sodium chloride flush Medications Prior to Admission:  Prior to Admission medications   Medication Sig Start Date End Date Taking? Authorizing Provider  acetaminophen (TYLENOL) 500 MG tablet Take 1 tablet (500 mg total) by mouth every 6 (six) hours as needed. Mild pain. Patient taking differently: Take 500 mg by mouth every 6 (six) hours as needed for mild pain or headache. Mild pain. 03/02/12  Yes Modena Jansky, MD  ALPRAZolam Duanne Moron) 0.25 MG tablet Take 0.125-0.25 mg by mouth 4 (four) times daily - after meals and at bedtime. Takes 0.5 tablet by mouth three times a day and a whole tablet by mouth at bedtime   Yes Historical Provider, MD  aspirin 81 MG tablet Take 81 mg  by mouth daily.     Yes Historical Provider, MD  carisoprodol (SOMA) 250 MG tablet Take 62.5 mg by mouth as needed (1/4 tab, muscle pain).    Yes Historical Provider, MD  Cholecalciferol (VITAMIN D3) 5000 units TABS Take 5,000 Units by mouth daily.   Yes Historical Provider, MD  clopidogrel (PLAVIX) 75 MG tablet TAKE 1 TABLET BY MOUTH EVERY DAY 06/29/15  Yes Darlin Coco, MD  diphenhydrAMINE (BENADRYL) 12.5 MG/5ML elixir Take 12.5 mg by mouth 4 (four) times daily as needed. itching   Yes Historical Provider, MD  docusate sodium (COLACE) 100 MG capsule Take 100 mg by mouth 3 (three) times daily as needed (constipation).    Yes Historical Provider, MD  ferrous sulfate 325 (65 FE) MG tablet Take 325 mg by mouth daily.   Yes Historical Provider, MD  furosemide (LASIX) 20 MG tablet Take 10 mg by mouth daily. 10/27/15  Yes Historical Provider, MD  Multiple Vitamin (MULTIVITAMIN) capsule Take 1 capsule by mouth daily.   Yes Historical Provider, MD  MYRBETRIQ 50 MG TB24 tablet Take 50 mg by mouth daily. 08/17/15  Yes Historical Provider, MD  saccharomyces boulardii (FLORASTOR) 250 MG capsule Take 250 mg by mouth daily.   Yes Historical Provider, MD  sodium chloride 1 G tablet Take 2 g by mouth 3 (three) times daily. 03/02/12  Yes Modena Jansky, MD  lactose free nutrition (BOOST PLUS) LIQD Take 237 mLs by mouth daily after lunch daily after lunch. Patient not taking: Reported on 11/10/2015 03/02/12   Modena Jansky, MD  NITROSTAT 0.4 MG SL tablet PLACE 1 TABLET UNDER THE TONGUE EVERY 5 MINUTES AS NEEDED FOR CHEST PAIN 06/08/15   Darlin Coco, MD  pantoprazole (PROTONIX) 40 MG tablet Take 40 mg by mouth daily.    Historical Provider, MD   Allergies  Allergen Reactions  . Amitriptyline Other (See Comments)    delusions  . Avelox [Moxifloxacin Hcl In Nacl] Nausea And Vomiting  . Carisoprodol Other (See Comments)    Hallucinations. Can take a small dose. 1/4 tablet ok   . Citalopram Hydrobromide  Other (See Comments)    Increased sodium levels  . Crestor [Rosuvastatin Calcium] Other (See Comments)    Myalgias   . Erythromycin Other (See Comments)    Stomach problems  . Flexeril [Cyclobenzaprine] Other (See Comments)    Extreme drowsiness   . Hydrocodone-Acetaminophen Nausea And Vomiting  . Lescol     Gi symptoms  . Lescol [Fluvastatin]     ?  Marland Kitchen Levaquin [Levofloxacin] Diarrhea  . Lidocaine Other (See Comments)    Body rash  . Lipitor [Atorvastatin Calcium]     Myalgias   . Lovastatin     Myalgias   . Macrobid [Nitrofurantoin Monohyd Macro]     Diarrhea   . Novocain [Procaine]     syncope  . Oxycodone Nausea And Vomiting  . Pravachol     Myalgias   . Procaine Hcl     Syncope   . Promethazine Hcl     No good  . Shellfish Allergy Hives  . Wellbutrin [Bupropion]     paranoia   . Zocor [Simvastatin]     Myalgias   . Zofran [Ondansetron]     abdominal cramps    Review of Systems  Constitutional: Positive for activity change and fatigue. Negative for appetite change.  Respiratory: Positive for cough and shortness of breath.   Neurological: Positive for weakness.    Physical Exam  Constitutional: She is oriented to person, place, and time. She appears well-developed.  HENT:  Head: Normocephalic and atraumatic.  Cardiovascular: Normal rate.   Respiratory: Effort normal. No accessory muscle usage. No tachypnea. No respiratory distress.  GI: Soft. Normal appearance.  Neurological: She is alert and oriented to person, place, and time.  Psychiatric: She has a normal mood and affect.    Vital Signs: BP 100/46 mmHg  Pulse 86  Temp(Src) 97.6 F (36.4 C) (Oral)  Resp 18  Ht _0  (1.575 m)  Wt 47.083 kg (103 lb 12.8 oz)  BMI 18.98 kg/m2  SpO2 99%  SpO2: SpO2: 99 % O2 Device:SpO2: 99 % O2 Flow Rate: .O2 Flow Rate (L/min): 2 L/min  IO: Intake/output summary:  Intake/Output Summary (Last 24 hours) at 11/13/15 1649 Last data filed at 11/13/15 1255   Gross per 24 hour  Intake    680 ml  Output   1850 ml  Net  -  1170 ml    LBM: Last BM Date: 11/08/15 Baseline Weight: Weight: 47.174 kg (104 lb) Most recent weight: Weight: 47.083 kg (103 lb 12.8 oz)      Palliative Assessment/Data:  Flowsheet Rows        Most Recent Value   Intake Tab    Referral Department  Cardiology   Unit at Time of Referral  Cardiac/Telemetry Unit   Palliative Care Primary Diagnosis  Cardiac   Date Notified  11/13/15   Palliative Care Type  New Palliative care   Reason for referral  Clarify Goals of Care   Date of Admission  11/09/15   # of days IP prior to Palliative referral  4   Clinical Assessment    Psychosocial & Spiritual Assessment    Palliative Care Outcomes       Additional Data Reviewed:  CBC:    Component Value Date/Time   WBC 8.4 11/10/2015 0521   HGB 12.7 11/10/2015 0521   HCT 36.2 11/10/2015 0521   PLT 198 11/10/2015 0521   MCV 93.8 11/10/2015 0521   NEUTROABS 6.8 11/09/2015 1609   LYMPHSABS 1.4 11/09/2015 1609   MONOABS 1.0 11/09/2015 1609   EOSABS 0.2 11/09/2015 1609   BASOSABS 0.0 11/09/2015 1609   Comprehensive Metabolic Panel:    Component Value Date/Time   NA 134* 11/13/2015 0526   K 4.1 11/13/2015 0526   CL 94* 11/13/2015 0526   CO2 30 11/13/2015 0526   BUN 13 11/13/2015 0526   CREATININE 0.76 11/13/2015 0526   GLUCOSE 117* 11/13/2015 0526   CALCIUM 9.0 11/13/2015 0526   AST 31 11/10/2015 1730   ALT 31 11/10/2015 1730   ALKPHOS 66 11/10/2015 1730   BILITOT 0.5 11/10/2015 1730   PROT 5.6* 11/10/2015 1730   ALBUMIN 3.2* 11/13/2015 0526     Time In: 1530 Time Out: 1700 Time Total: 75mn Greater than 50%  of this time was spent counseling and coordinating care related to the above assessment and plan.  Signed by: PPershing Proud NP  APershing Proud NP  11/61/0960 4:49 PM  Please contact Palliative Medicine Team phone at [phone] questions and concerns.

## 2015-11-13 NOTE — Consult Note (Signed)
   [facility] Inpatient Consult   11/13/2015  Sabrina Moore 12-10-1926 [id number] Patient was screened for HF.  Admitted 11/09/15. Explained that [place] Management is a covered benefit of Medicare insurance.   Met with the patient regarding the benefits of [facility] [place] [facility] services. Patient states her daughter helps her make decisions.  Patient lives at [facility] facility.  She and daughter has elected [place] for post hospital Home health nurse.   Review information for [facility] Care Management and a brochure was provided with contact information.  Explained that [facility] does not interfere with or replace any services arranged by the inpatient care management staff.  Patient states she will give her daughter the brochure and contact information to follow up if services will be needed.  For questions, please contact: Natividad Brood, RN BSN [facility] Liaison  [phone] business mobile phone Toll free office [phone]

## 2015-11-13 NOTE — Care Management Important Message (Signed)
Important Message  Patient Details  Name: Sabrina Moore MRN: [id number] Date of Birth: Oct 09, 1927   Medicare Important Message Given:  Yes    Trecia Maring Abena 11/13/2015, 2:12 PM

## 2015-11-13 NOTE — Plan of Care (Signed)
Problem: Education: Goal: Ability to demonstrate managment of disease process will improve Outcome: Progressing Patient still has concern about whether or not high-resolution CT scan will be ordered. On review of notes, it is still unclear whether or not cardiology will order this test. Unable to answer questions to patient's satisfaction at this time.  Vital signs remain stable, though evening BP running a little softer than normal: Filed Vitals:    11/13/15 0500 11/13/15 0900 11/13/15 1401 11/13/15 2045  BP: 103/59 104/56 100/46 99/51  Pulse: 84   86 84  Temp: 98.3 F (36.8 C)   97.6 F (36.4 C) 98 F (36.7 C)  TempSrc: Oral   Oral Oral  Resp:     18 16  Height:          Weight: 47.083 kg (103 lb 12.8 oz)        SpO2: 98%   99% 98%   Patient reports no problems with weakness, dizziness or lightheadedness at this time. Will recheck BP in one hour.  Educated patient at bedside earlier this evening re:  Plan of care (CHF)  Tests/procedure/labs  Possible high-resolution CT scan  Medications:  Furosemide  Spironolactone  Cholecalciferol  Polyethylene glycol  Docusate sodium  Normal saline IV flush  Intake/output monitoring  Daily standing weight monitoring  Safety goals  Activity progression  Pain scale/goal/management strategies  Initial discharge planning   Patient feeling less tired today than yesterday. Actively participating in therapies and has been highly mobile today with walker and supervision. Progressing well; on-target for discharge.  Continuing to monitor.

## 2015-11-13 NOTE — Progress Notes (Signed)
Physical Therapy Treatment Patient Details Name: Sabrina Moore MRN: [id number] DOB: 1927/09/06 Today's Date: 11/13/2015    History of Present Illness Pt is a 82 y/o F admitted for chest pain, DOB and cough. Dx: CHF.  Pt's PMH includes ischemic heart disease w/ stent placement, vasovagal syncope, fatigue, anxiety, iron deficiency anemia.    PT Comments    [name] demonstrated increase in ambulatory endurance, fatiguing after 80 ft.  Encouraged pt to ambulate again later today w/ nursing staff.   Follow Up Recommendations  Home health PT;Other (comment);Supervision for mobility/OOB (Cardiopulmonary rehab)     Equipment Recommendations  None recommended by PT    Recommendations for Other Services OT consult     Precautions / Restrictions Precautions Precautions: Fall Restrictions Weight Bearing Restrictions: No    Mobility  Bed Mobility Overal bed mobility: Modified Independent             General bed mobility comments: Increased time  Transfers Overall transfer level: Needs assistance Equipment used: Rolling walker (2 wheeled) Transfers: Sit to/from [employer] Sit to Stand: Min guard Stand pivot transfers: Min assist       General transfer comment: Pt demonstrates safe technique using RW during transfer. 1 person HHA for stand pivot transfer from chair to bed.    Ambulation/Gait Ambulation/Gait assistance: Min guard Ambulation Distance (Feet): 100 Feet Assistive device: Rolling walker (2 wheeled) Gait Pattern/deviations: Step-through pattern;Decreased stride length;Trunk flexed;Antalgic   Gait velocity interpretation: Below normal speed for age/gender General Gait Details: HR between 90-111 while ambulating.  Cues for pursed lip breathing w/ SpO2 remaining in high 90's on RA.  Pt fatigues after ambulating 80 ft.  Close min guard for safety.   Stairs            Wheelchair Mobility    Modified Rankin (Stroke Patients Only)        Balance Overall balance assessment: Needs assistance Sitting-balance support: Feet supported;Bilateral upper extremity supported Sitting balance-Leahy Scale: Good     Standing balance support: During functional activity;Bilateral upper extremity supported Standing balance-Leahy Scale: Fair Standing balance comment: able to wash hands w/ min guard assist                    Cognition Arousal/Alertness: Awake/alert Behavior During Therapy: WFL for tasks assessed/performed Overall Cognitive Status: Within Functional Limits for tasks assessed                      Exercises General Exercises - Lower Extremity Ankle Circles/Pumps: AROM;Both;10 reps;Seated Long Arc Quad: AROM;Both;10 reps;Seated Hip Flexion/Marching: AROM;Both;10 reps;Seated    General Comments General comments (skin integrity, edema, etc.): Discussed d/c plan w/ pt and daughter who agreed w/ plan to return to Ind living w/ initial 24/7 assist from family for first few days and if pt needs further 24/7 assist/supervision, family will be able to arrange this w/ the facility.      Pertinent Vitals/Pain Pain Assessment: No/denies pain    Home Living                      Prior Function            PT Goals (current goals can now be found in the care plan section) Acute Rehab PT Goals Patient Stated Goal: to go home when able and to receive rehab PT Goal Formulation: With patient Time For Goal Achievement: 11/25/15 Potential to Achieve Goals: Good Progress towards PT goals: Progressing toward  goals    Frequency  Min 3X/week    PT Plan Current plan remains appropriate    Co-evaluation             End of Session Equipment Utilized During Treatment: Gait belt Activity Tolerance: Patient tolerated treatment well;Patient limited by fatigue Patient left: with call bell/phone within reach;in bed;with family/visitor present (pt declined chair as she wanted to sleep)     Time:  1032-1100 PT Time Calculation (min) (ACUTE ONLY): 28 min  Charges:  $Gait Training: 8-22 mins $Therapeutic Exercise: 8-22 mins                    G Codes:      Michail Jewels PT, [place] [phone] Pager: [phone] 11/13/2015, 11:14 AM

## 2015-11-13 NOTE — Plan of Care (Signed)
Problem: Activity: Goal: Risk for activity intolerance will decrease Outcome: Completed/Met Date Met:  11/13/15 Patient states that she is feeling "much better." Able to participate in therapies and mobility out of bed; standing at sink with supervisory assistance for greater than 10 minutes this evening "washing-up" and performing oral hygiene. Still demonstrating some fine crackles at both bases of lung fields, but much less pronounced than on day of admission. BP tolerating new diuretic medications well.  Recent vital signs: Filed Vitals:    11/11/15 2146 11/12/15 0507 11/12/15 1405 11/12/15 2115  BP: 119/57 96/43 108/62 116/67  Pulse: 96 84 80 88  Temp: 98.1 F (36.7 C) 98.8 F (37.1 C) 98.5 F (36.9 C) 98.5 F (36.9 C)  TempSrc: Oral Oral Oral Oral  Resp:   17 20    Height:          Weight:   47.1 kg (103 lb 13.4 oz)      SpO2: 95% 97% 95% 95%    Patient is primarily concerned about whether or not her "scan" has been ordered and whether or not she is "going home tomorrow." Explained to her that this test was discussed by her doctors but has not been ordered yet. I told her that it is possible she could be discharged on Monday, but that the doctor's notes were not very specific regarding this either. I am not sure whether or not the MD and her family have discussed palliative care and hospice with her or not, so I did not broach this subject at the time.  Patient educated re:  Plan of care (heart failure)  Tests/procedures/labs (basic review)  Medications  Vitamin D  Alprazolam  Spironolactone  Furosemide  Pain scale/goal/management strategies  Need to call RN for symptoms  Activity progression  Initial discharge planning  Patient and family will need review of heart failure education to ensure safety and compliance with plan of care and medications post-discharge. Patient does appear to be progressing well; on-target for discharge.  Continuing to  monitor.

## 2015-11-13 NOTE — Care Management (Signed)
Case Management Note  Patient Details  Name: Sabrina Moore MRN: [id number] Date of Birth: [birth year]  Subjective/Objective: CHF, Hyponatremia    Action/Plan: NCM spoke to pt and daughter Sabrina Moore # [phone]. Dtr states she does not HH PT at this time. She is agreeable to [facility] RN. States pt had PT in the past and her back pain got worse. Her PCP states they want pt to mainly walk with RW as needed. (No strenuous exercises). Offered choice for [facility] RN and dtr agreeable to [place] for [facility]. Waiting Palliative Care Consult for discussion with family on home with Hospice. (HH vs Hospice). Contacted [facility] rep for possible dc home with [facility] RN. Pt has RW and bedside commode. Dtr will follow up at the medical supply store for scale, and shower chair. Pt has assistance with housekeeping. Dtr report [place] does have personal care assistant that could come out to help with bathing. States she does visit pt frequently and assist with her care.    Expected Discharge Date: 11/15/2015   Expected Discharge Plan: Home/Self Care  In-House Referral: NA  Discharge planning Services CM Consult  Post Acute Care Choice: Home Health Choice offered to: Adult Children  DME Arranged: N/A DME Agency: NA  HH Arranged: RN HH Agency: [facility]  Status of Service: Completed, signed off  Medicare Important Message Given:   Date Medicare IM Given:   Medicare IM give by:   Date Additional Medicare IM Given:   Additional Medicare Important Message give by:    If discussed at Long Length of Stay Meetings, dates discussed:   Additional Comments:  Elliot Cousin, RN 11/13/2015, 1:34 PM

## 2015-11-13 NOTE — Care Management Note (Deleted)
Case Management Note  Patient Details  Name: Sabrina Moore MRN: [id number] Date of Birth: 08/27/1927  Subjective/Objective: CHF, Hyponatremia    Action/Plan: NCM spoke to pt and daughter Melanie Royer # [phone]. Dtr states she does not HH PT at this time. She is agreeable to HH RN. States pt had PT in the past and her back pain got worse. Her PCP states they want pt to mainly walk with RW as needed. (No strenuous exercises). Offered choice for HH RN and dtr agreeable to [facility] for HH. Waiting Palliative Care Consult for discussion with family on home with Hospice. (HH vs Hospice). Contacted [facility] rep for possible dc home with HH RN. Pt has RW and bedside commode. Dtr will follow up at the medical supply store for scale, and shower chair. Pt has assistance with housekeeping. Dtr report [place] does have personal care assistant that could come out to help with bathing. States she does visit pt frequently and assist with her care.    Expected Discharge Date: 11/15/2015   Expected Discharge Plan: Home/Self Care  In-House Referral: NA  Discharge planning Services CM Consult  Post Acute Care Choice: Home Health Choice offered to: Adult Children  DME Arranged: N/A DME Agency: NA  HH Arranged: RN HH Agency: [facility]  Status of Service: Completed, signed off  Medicare Important Message Given:   Date Medicare IM Given:   Medicare IM give by:   Date Additional Medicare IM Given:   Additional Medicare Important Message give by:    If discussed at Long Length of Stay Meetings, dates discussed:   Additional Comments:  Sharolyn Weber Ellen, RN 11/13/2015, 1:34 PM              

## 2015-11-14 ENCOUNTER — Encounter (HOSPITAL_COMMUNITY): Payer: Self-pay | Admitting: Radiology

## 2015-11-14 ENCOUNTER — Inpatient Hospital Stay (HOSPITAL_COMMUNITY): Payer: Medicare Other

## 2015-11-14 DIAGNOSIS — I509 Heart failure, unspecified: Secondary | ICD-10-CM | POA: Insufficient documentation

## 2015-11-14 DIAGNOSIS — Z515 Encounter for palliative care: Secondary | ICD-10-CM | POA: Insufficient documentation

## 2015-11-14 LAB — RENAL FUNCTION PANEL
Albumin: 3.4 g/dL — ABNORMAL LOW (ref 3.5–5.0)
Anion gap: 11 (ref 5–15)
BUN: 14 mg/dL (ref 6–20)
CHLORIDE: 91 mmol/L — AB (ref 101–111)
CO2: 30 mmol/L (ref 22–32)
CREATININE: 0.64 mg/dL (ref 0.44–1.00)
Calcium: 8.9 mg/dL (ref 8.9–10.3)
GFR calc non Af Amer: >60 mL/min (ref >60)
Glucose, Bld: 117 mg/dL — ABNORMAL HIGH (ref 65–99)
POTASSIUM: 3.9 mmol/L (ref 3.5–5.1)
Phosphorus: 4.1 mg/dL (ref 2.5–4.6)
Sodium: 132 mmol/L — ABNORMAL LOW (ref 135–145)

## 2015-11-14 MED ORDER — IOHEXOL 300 MG/ML  SOLN
75.0000 mL | Freq: Once | INTRAMUSCULAR | Status: AC | PRN
  Administered 2015-11-14: 75 mL via INTRAVENOUS

## 2015-11-14 NOTE — Progress Notes (Addendum)
Patient ID: Sabrina Moore, female   DOB: 31-Mar-1927, 82 y.o.   MRN: [id number]  [facility] Progress Note   Assessment/ Plan:   1. Hyponatremia: Appears to be multifactorial from SIADH/reset osmostat and exacerbated by CHF decompensation. Fortunately, sodium chloride supplement stopped after she was admitted with CHF decompensation and furosemide dose titrated upwards. No evidence of adrenal insufficiency based on cosyntropin stim test. Continue furosemide 40 mg twice a day-transitioned from 40 mg IV daily. Continue fluid restriction of 1.2 L/24 hours and encouraged protein intake. 2. CHF exacerbation: appears to be clinically improving with good urine output overnight of 1.9 L- with concomitant weight change. Continue furosemide/spironolactone at current dose and consider decrease/discontinuing spironolactone if hypotension becomes more prominent over hyponatremia worsens. 3. Chronic back pain: due to compression fracture of T10-ongoing pain control 4. Disposition: The plan is for discharge back to the assisted living facility where she currently resides with escalation of assistance transiently.   Will sign off at this time and see her back as needed. I have set her up for follow up with Dr.Coladonato on 2/20 at 12:30pm with labs 1 week prior to visit.  Subjective:   Complaints of some transient sleepiness after she got her dose of spironolactone-daughter has noticed intermittent asymptomatic low blood pressures (documented systolics in the 90s/diastolic in the 40s). Her daughter asks for advance notice prior to her mother being discharged so that she can plan for a caretaker.    Objective:   BP 109/57 mmHg  Pulse 90  Temp(Src) 97.8 F (36.6 C) (Oral)  Resp 16  Ht  (1.575 m)  Wt 45.904 kg (101 lb 3.2 oz)  BMI 18.51 kg/m2  SpO2 97%  Intake/Output Summary (Last 24 hours) at 11/14/15 7829 Last data filed at 11/14/15 0645  Gross per 24 hour  Intake    580 ml  Output    1975 ml  Net  -1395 ml   Weight change: -1.179 kg (-2 lb 9.6 oz)  Physical Exam: Gen: comfortably resting in bed, daughter at bedside CVS: pulse regular in rhythm, normal rate, S1 and S2 normal Resp: fine rales bibasally Abd: soft, flat, nontender Ext: no lower extremity edema  Imaging: No results found.  Labs: BMET  Recent Labs Lab 11/10/15 1159 11/10/15 1730 11/11/15 0400 11/12/15 0715 11/12/15 0950 11/13/15 0526 11/14/15 0717  NA 130* 132* 133* 133* 131* 134* 132*  K 3.9 4.3 4.4 4.2 4.4 4.1 3.9  CL 93* 94* 96* 92* 96* 94* 91*  CO2 GLUCOSE 110* 118* 113* 119* 115* 117* 117*  BUN CREATININE 0.67 0.69 0.72 0.63 0.61 0.76 0.64  CALCIUM 8.6* 8.9 8.8* 8.9 9.0 9.0 8.9  PHOS  --   --  3.8 4.1 3.7 3.8 4.1   CBC  Recent Labs Lab 11/09/15 1609 11/10/15 0521  WBC 9.4 8.4  NEUTROABS 6.8  --   HGB 13.1 12.7  HCT 38.9 36.2  MCV 94.2 93.8  PLT 217 198   Medications:    . aspirin EC  81 mg Oral Daily  . cholecalciferol  400 Units Oral BID  . clopidogrel  75 mg Oral Daily  . enoxaparin (LOVENOX) injection  40 mg Subcutaneous Q24H  . ferrous sulfate  325 mg Oral Q breakfast  . furosemide  40 mg Oral BID  . lactose free nutrition  237 mL Oral QPC lunch  . mirabegron ER  50 mg  Oral Daily  . multivitamin with minerals  1 tablet Oral Daily  . pantoprazole  40 mg Oral Daily  . polyethylene glycol  17 g Oral Daily  . sodium chloride flush  3 mL Intravenous Q12H  . spironolactone  12.5 mg Oral Daily   Zetta Bills, MD 11/14/2015, 9:22 AM

## 2015-11-14 NOTE — Consult Note (Signed)
   [facility] CM Inpatient Consult   11/14/2015  BAYLEI SIEBELS 1927-04-16 [id number] Referral received and patient was evaluated for [facility] services.  Had met with the patient the day before. Met with the patient and daughter, Threasa Beards at the bedside. [place] services for [employer].  Daughter states she was interested in the palliative care program and states she was getting confused with everything that has been offered.  She wanted a nurse to be available lab work when needed for her doctor.  She wanted the labs to be done at her mother's apartment.  She described the things she had to go through to get her mother some assistance and was trying to avoid her mother being admitted to the hospital.  She states she realizes that her mother's condition is one that will continue to decline but wanted as much support as possible.  She did not feel that she wanted to consent to [facility] services as her mother will likely discharge with home health care. She was interested in the community palliative program through [facility].  Form given to the daughter with contact information as well.  Also, spoke with inpatient RNCM regarding the form and steps for the referral was asked.  Call placed to [facility] at [phone] and spoke with the department.  Gave the contact information for the patient for follow up.  Patient did not consent to [facility] services.  Encourage daughter to call if she has further questions or needs.  She has the brochure and contact information needed for the outreach.  For questions, please ontact: Natividad Brood, RN BSN [facility] Liaison  [phone] business mobile phone Toll free office [phone]

## 2015-11-14 NOTE — Progress Notes (Signed)
UR Completed Cashmere Dingley Graves-Bigelow, RN,BSN [phone]  

## 2015-11-14 NOTE — Progress Notes (Addendum)
1446 11-14-15 CM received referral for Outpatient Palliative Care.  [facility] is a Program of [facility]. CM did make referral with Margie. [facility] to be in contact with the patient and family. CM did make [facility] aware that pt will have HH Services via [facility]. No further needs from CM at this time. Gala Lewandowsky RN, BSN [phone]

## 2015-11-14 NOTE — Progress Notes (Signed)
PATIENT ID: [id number] with CAD s/p CABG and PCI, hypertension, hyperlipidemia, and chronic hyponatremia here with acute systolic heart failure and worsened hyponatremia. Patient examined. I agree with the findings as above. The patient exam reveals:  INTERVAL HISTORY: Negative 1.3L.  Renal function improving.  Hyponatremia slightly worse.  SUBJECTIVE:  Feeling well.  Breathing continues to improve.   PHYSICAL EXAM Filed Vitals:   11/14/15 0500 11/14/15 0810 11/14/15 1122 11/14/15 1227  BP: 108/55 109/57 97/51   Pulse: 80 90 81 91  Temp: 98.6 F (37 C) 97.8 F (36.6 C) 98.1 F (36.7 C)   TempSrc: Oral Oral Oral   Resp: Height:      Weight: 45.904 kg (101 lb 3.2 oz)     SpO2: 96% 97% 94%    General:  Well-appearing, frail, elderly woman in NAD.   Neck: JVD at clavicle at 45. Lungs:  bibasilar crackles Heart:Regular rate and rhythm. No murmur/rub/gallops. Normal S1/S2. Abdomen: Soft. Nontender, nondistended. Active bowel sounds Extremities: Warm and well-perfused. No edema  LABS: Lab Results  Component Value Date   TROPONINI 0.05* 11/10/2015   Results for orders placed or performed during the hospital encounter of 11/09/15 (from the past 24 hour(s))  Renal function panel     Status: Abnormal   Collection Time: 11/14/15  7:17 AM  Result Value Ref Range   Sodium 132 (L) 135 - 145 mmol/L   Potassium 3.9 3.5 - 5.1 mmol/L   Chloride 91 (L) 101 - 111 mmol/L   CO2 30 22 - 32 mmol/L   Glucose, Bld 117 (H) 65 - 99 mg/dL   BUN 14 6 - 20 mg/dL   Creatinine, Ser 6.96 0.44 - 1.00 mg/dL   Calcium 8.9 8.9 - 29.5 mg/dL   Phosphorus 4.1 2.5 - 4.6 mg/dL   Albumin 3.4 (L) 3.5 - 5.0 g/dL   GFR calc non Af Amer >60 >60 mL/min   GFR calc Af Amer >60 >60 mL/min   Anion gap 11 5 - 15    Intake/Output Summary (Last 24 hours) at 11/14/15 1359 Last data filed at 11/14/15 1153  Gross per 24 hour  Intake    730 ml  Output   1175 ml  Net   -445 ml   Telemetry: Sinus rhythm.  Frequent PVCs.    ASSESSMENT AND PLAN:  Principal Problem:   Acute systolic (congestive) heart failure (HCC) Active Problems:   Hyponatremia   S/P CABG (coronary artery bypass graft)   Hypotension   CHF (congestive heart failure) (HCC)   Palliative care encounter   Acute congestive heart failure (HCC)   # Acute systolic heart failure: New this admission.  EF now 20-25% with mild-moderate MR.  She remains mildly volume overloaded with bibasilar crackles.   - Continue lasix  po bid - Hold spironolactone for now.  Restart when lasix is reduced and she is euvolemic.  # Hyponatremia: Improved but persists.  Thought to be due to SIADH +/- reset osmostat and acute systolic heart failure.  Will obtain a CT to rule out malignancy.  # Hypotension: Blood pressure low today in the setting of aggressive diuresis.  Will hold spironolactone for now.   # CAD: No work up this hospitalization.  She denies chest pain but does have a newly-depressed LVEF.  Continue home aspirin and Plavix.  No beta blocker due to hypotension.  She has not tolerated statins.  Given the palliative approach, we will not push this.   #  Dispo: Likely back to assisted living tomorrow.  Renan Danese C. Duke Salvia, MD, [facility] 11/14/2015 1:59 PM

## 2015-11-14 NOTE — Progress Notes (Signed)
Physical Therapy Treatment Patient Details Name: Sabrina Moore MRN: [id number] DOB: 1927/06/10 Today's Date: 11/14/2015    History of Present Illness Pt is a 82 y/o F admitted for chest pain, DOB and cough. Dx: CHF.  Pt's PMH includes ischemic heart disease w/ stent placement, vasovagal syncope, fatigue, anxiety, iron deficiency anemia.    PT Comments    Pt demonstrated increased activity tolerance.  Pt performed increased gt distance and educated on supine LE therapeutic exercise to improve strength and promote functional independence.    Follow Up Recommendations  Home health PT;Other (comment);Supervision for mobility/OOB (cardiopulmonary rehab.  )     Equipment Recommendations  None recommended by PT    Recommendations for Other Services       Precautions / Restrictions Precautions Precautions: Fall Restrictions Weight Bearing Restrictions: No    Mobility  Bed Mobility Overal bed mobility: Modified Independent             General bed mobility comments: Increased time  Transfers Overall transfer level: Needs assistance Equipment used: Rolling walker (2 wheeled) Transfers: Sit to/from Stand Sit to Stand: Supervision Stand pivot transfers: Supervision       General transfer comment: Pt demonstrates safe technique using RW during transfer.  Pt required cues to back completely to seated surface before sitting.  Pt performed transfers from bed and com  Ambulation/Gait Ambulation/Gait assistance: Min guard Ambulation Distance (Feet): 386 Feet Assistive device: Rolling walker (2 wheeled) Gait Pattern/deviations: Step-through pattern;Decreased stride length;Trunk flexed     General Gait Details: No DOE noted demonstrated increased activity tolerance with stable HR.  Pt remains to require cues for negotiation of obstacles and safety during turns and backing.     Stairs            Wheelchair Mobility    Modified Rankin (Stroke Patients Only)        Balance   Sitting-balance support: Bilateral upper extremity supported Sitting balance-Leahy Scale: Normal     Standing balance support: Bilateral upper extremity supported Standing balance-Leahy Scale: Good                      Cognition Arousal/Alertness: Awake/alert Behavior During Therapy: WFL for tasks assessed/performed Overall Cognitive Status: Within Functional Limits for tasks assessed                      Exercises General Exercises - Lower Extremity Ankle Circles/Pumps: AROM;Both;10 reps;Supine Quad Sets: AROM;Both;10 reps;Supine Gluteal Sets: AROM;Both;10 reps;Supine Heel Slides: AROM;Both;10 reps;Supine Hip ABduction/ADduction: AROM;Both;10 reps;Supine Straight Leg Raises: AROM;Both;10 reps;Supine    General Comments        Pertinent Vitals/Pain Pain Assessment: No/denies pain    Home Living                      Prior Function            PT Goals (current goals can now be found in the care plan section) Acute Rehab PT Goals Patient Stated Goal: to go home when able and to receive rehab Potential to Achieve Goals: Good Progress towards PT goals: Progressing toward goals    Frequency  Min 3X/week    PT Plan Current plan remains appropriate    Co-evaluation             End of Session   Activity Tolerance: Patient tolerated treatment well;Patient limited by fatigue Patient left: with call bell/phone within reach;with family/visitor present     Time:  4098-1191 PT Time Calculation (min) (ACUTE ONLY): 30 min  Charges:  $Gait Training: 8-22 mins $Therapeutic Exercise: 8-22 mins                    G Codes:      Florestine Avers Nov 24, 2015, 12:44 PM  Joycelyn Rua, PTA pager [phone]

## 2015-11-15 DIAGNOSIS — Z515 Encounter for palliative care: Secondary | ICD-10-CM

## 2015-11-15 LAB — RENAL FUNCTION PANEL
ALBUMIN: 3.5 g/dL (ref 3.5–5.0)
Anion gap: 13 (ref 5–15)
BUN: 13 mg/dL (ref 6–20)
CO2: 31 mmol/L (ref 22–32)
CREATININE: 0.66 mg/dL (ref 0.44–1.00)
Calcium: 9.1 mg/dL (ref 8.9–10.3)
Chloride: 91 mmol/L — ABNORMAL LOW (ref 101–111)
Glucose, Bld: 104 mg/dL — ABNORMAL HIGH (ref 65–99)
PHOSPHORUS: 4.2 mg/dL (ref 2.5–4.6)
POTASSIUM: 4 mmol/L (ref 3.5–5.1)
Sodium: 135 mmol/L (ref 135–145)

## 2015-11-15 MED ORDER — FUROSEMIDE 40 MG PO TABS: 40.0000 mg | ORAL_TABLET | Freq: Two times a day (BID) | ORAL | Status: DC

## 2015-11-15 MED ORDER — SPIRONOLACTONE 25 MG PO TABS: 12.5000 mg | ORAL_TABLET | Freq: Every day | ORAL | Status: DC

## 2015-11-15 NOTE — Discharge Summary (Signed)
Discharge Summary    Patient ID: Sabrina Moore,  MRN: [id number], DOB/AGE: 80-21-1928 80 y.o.  Admit date: 11/09/2015 Discharge date: 11/15/2015  Primary Care Provider: GATES,ROBERT NEVILL Primary Cardiologist: [name]  Discharge Diagnoses    Principal Problem:   Acute systolic (congestive) heart failure (HCC) Active Problems:   Hyponatremia   S/P CABG (coronary artery bypass graft)   Hypotension   CHF (congestive heart failure) (HCC)   Palliative care encounter   Acute congestive heart failure (HCC)   Allergies Allergies  Allergen Reactions  . Amitriptyline Other (See Comments)    delusions  . Avelox [Moxifloxacin Hcl In Nacl] Nausea And Vomiting  . Carisoprodol Other (See Comments)    Hallucinations. Can take a small dose. 1/4 tablet ok   . Citalopram Hydrobromide Other (See Comments)    Increased sodium levels  . Crestor [Rosuvastatin Calcium] Other (See Comments)    Myalgias   . Erythromycin Other (See Comments)    Stomach problems  . Flexeril [Cyclobenzaprine] Other (See Comments)    Extreme drowsiness   . Hydrocodone-Acetaminophen Nausea And Vomiting  . Lescol     Gi symptoms  . Lescol [Fluvastatin]     ?  Marland Kitchen Levaquin [Levofloxacin] Diarrhea  . Lidocaine Other (See Comments)    Body rash  . Lipitor [Atorvastatin Calcium]     Myalgias   . Lovastatin     Myalgias   . Macrobid [Nitrofurantoin Monohyd Macro]     Diarrhea   . Novocain [Procaine]     syncope  . Oxycodone Nausea And Vomiting  . Pravachol     Myalgias   . Procaine Hcl     Syncope   . Promethazine Hcl     No good  . Shellfish Allergy Hives  . Wellbutrin [Bupropion]     paranoia   . Zocor [Simvastatin]     Myalgias   . Zofran [Ondansetron]     abdominal cramps    Diagnostic Studies/Procedures    2D echo 11/10/15 Study Conclusions  - Left ventricle: The cavity size was mildly dilated. Systolic function was severely reduced. The estimated ejection  fraction was in the range of 20% to 25%. Severe diffuse hypokinesis with distinct regional wall motion abnormalities. Akinesis of the basal-midlateral and inferolateral myocardium. Features are consistent with a pseudonormal left ventricular filling pattern, with concomitant abnormal relaxation and increased filling pressure (grade 2 diastolic dysfunction). - Mitral valve: There was moderate regurgitation directed eccentrically and posteriorly. The acceleration rate of the regurgitant jet was reduced, consistent with a low dP/dt. - Left atrium: The atrium was mildly dilated. - Right ventricle: Systolic function was moderately reduced. - Tricuspid valve: There was mild-moderate regurgitation directed centrally. The acceleration rate of the regurgitant jet was reduced, consistent with a low dP/dt. - Pulmonary arteries: Systolic pressure was moderately increased. PA peak pressure: 49 mm Hg (S). - Pericardium, extracardiac: There was a left pleural effusion.  Impressions:  - Compared to 2013, there is severe deterioration in left ventricular function.   Chest CT 11/14/15 FINDINGS: Mediastinum/Nodes: No axillary or supraclavicular lymphadenopathy. No mediastinal hilar adenopathy. No pericardial fluid. Post CABG anatomy.  Lungs/Pleura: This peripheral interlobular septal thickening and mild consolidation in the upper lobes (image 16, series 3). Mild bronchiectasis the lung bases. Small effusions. Central airways normal.  Upper abdomen: Limited view of the liver, kidneys, pancreas are unremarkable. Normal adrenal glands.  Musculoskeletal: Degenerate changes the spine. Midline sternotomy  IMPRESSION: 1. Mild interstitial edema pattern and pleural effusions.  2. Mild peripheral consolidation the upper lobes likely relates to interstitial edema and mild pulmonary edema. Infection felt less likely. 3. CABG anatomy. _____________   History of Present Illness     82 y/o female, followed by [name], with a Hx of CAD (s/p CABG in 1996. In 2011 had PCI/stent to SVG to diag; PCI / stent to SVG to intermedius, normal EF in 2013, who presented to [facility] on 11/09/15 with a complaint of persistent cough, SOB and orthopnea. She also initially reported chest tightness. Labs showed Troponin of 0.04. BNP 1231. CXR with bilateral interstitial edema and bilateral effusion. She was admitted for presumed acute CHF exacerbation.     Hospital Course     She was admitted to telemetry and given IV Lasix for presumed acute CHF exacerbation. 2D echo was obtained which confirmed systolic CHF, which was a new diagnosis. EF was markedly reduced compared to previous study in 2013. Her EF was estimated at 20-25% with global hypokinesis and regional variation. Cath was recommended to assess for ischemia as a potential etiology, however she voiced that she wanted to avoid invasive procedures and decided she did not want to undergo repeat cardiac cath. A palliative approach was decided and she was treated medically. She was continued on IV Lasix. Treatment for CHF was limited by hypotension. She did not tolerate addition of a BB or ACE-I. She also had issues with hyponatremia (chronic), thought to be due to SIADH +/- reset osmostat and acute systolic heart failure. A chest CT was obtained to rule-out malignancy. This was negative other that mild  interstital  edema. Her Na improved day of discharge back to normal range at 135. She diuresed well, -5.6 L total. She was diuresed down to a new dry weight of 101 lb. She had no recurrent dyspnea or orthopnea. She ambulated well w/o CP or dyspnea. She did not require supplemental O2 at time of discharge. PT felt that she would benefit from a [facility] RN, PT and OT. This was arranged prior to discharge. She was last seen and examined by Dr. Duke Salvia who determined she was stable for discharge home. She will f/u in clinic in 1 week with a f/u BMP.     Consultants: None      Discharge Vitals Blood pressure 113/63, pulse 88, temperature 98.8 F (37.1 C), temperature source Oral, resp. rate 16, height  (1.575 m), weight 101 lb 3.2 oz (45.904 kg), SpO2 98 %.  Filed Weights   11/13/15 0500 11/14/15 0500 11/15/15 0549  Weight: 103 lb 12.8 oz (47.083 kg) 101 lb 3.2 oz (45.904 kg) 101 lb 3.2 oz (45.904 kg)    Labs & Radiologic Studies     CBC No results for input(s): WBC, NEUTROABS, HGB, HCT, MCV, PLT in the last 72 hours. Basic Metabolic Panel  Recent Labs  11/14/15 0717 11/15/15 0540  NA 132* 135  K 3.9 4.0  CL 91* 91*  CO2 30 31  GLUCOSE 117* 104*  BUN 14 13  CREATININE 0.64 0.66  CALCIUM 8.9 9.1  PHOS 4.1 4.2   Liver Function Tests  Recent Labs  11/14/15 0717 11/15/15 0540  ALBUMIN 3.4* 3.5   No results for input(s): LIPASE, AMYLASE in the last 72 hours. Cardiac Enzymes No results for input(s): CKTOTAL, CKMB, CKMBINDEX, TROPONINI in the last 72 hours. BNP Invalid input(s): POCBNP D-Dimer No results for input(s): DDIMER in the last 72 hours. Hemoglobin A1C No results for input(s): HGBA1C in the last 72 hours. Fasting  Lipid Panel No results for input(s): CHOL, HDL, LDLCALC, TRIG, CHOLHDL, LDLDIRECT in the last 72 hours. Thyroid Function Tests No results for input(s): TSH, T4TOTAL, T3FREE, THYROIDAB in the last 72 hours.  Invalid input(s): FREET3  Dg Chest 2 View  11/09/2015  CLINICAL DATA:  82 year old presenting with 1 week history of chest pain, cough, and chest congestion. Current history of hypertension and ischemic heart disease. Prior CABG. EXAM: CHEST  2 VIEW COMPARISON:  11/06/2015 and earlier. FINDINGS: Sternotomy for CABG. Cardiac silhouette moderately enlarged. Interval development of interstitial pulmonary edema since the examination 3 days ago. Biapical pleuroparenchymal scarring, right greater than left. New small bilateral pleural effusions. Mild compression fracture of the upper  endplate of T10 with slight kyphous deformity, unchanged. IMPRESSION: Mild CHF, with moderate cardiomegaly and mild diffuse interstitial pulmonary edema associated with small bilateral pleural effusions. Electronically Signed   By: Hulan Saas M.D.   On: 11/09/2015 16:44   Dg Chest 2 View  11/06/2015  CLINICAL DATA:  Shortness of breath with cough and congestion EXAM: CHEST  2 VIEW COMPARISON:  April 20, 2014 FINDINGS: There is no edema or consolidation. The heart size and pulmonary vascularity are normal. No adenopathy. Patient is status post coronary artery bypass grafting. No adenopathy. There is degenerative change in the thoracic spine. Mild anterior wedging of the T10 vertebral body is stable. IMPRESSION: No edema or consolidation. Electronically Signed   By: Bretta Bang III M.D.   On: 11/06/2015 10:48   Ct Chest W Contrast  11/14/2015  CLINICAL DATA:  Short of breath, SIADH (inappropriate ADH production) CT CHEST WITH CONTRAST TECHNIQUE: Multidetector CT imaging of the chest was performed during intravenous contrast administration. CONTRAST:  75mL OMNIPAQUE IOHEXOL 300 MG/ML  SOLN COMPARISON:  Radiograph 11/09/2015 FINDINGS: Mediastinum/Nodes: No axillary or supraclavicular lymphadenopathy. No mediastinal hilar adenopathy. No pericardial fluid. Post CABG anatomy. Lungs/Pleura: This peripheral interlobular septal thickening and mild consolidation in the upper lobes (image 16, series 3). Mild bronchiectasis the lung bases. Small effusions. Central airways normal. Upper abdomen: Limited view of the liver, kidneys, pancreas are unremarkable. Normal adrenal glands. Musculoskeletal: Degenerate changes the spine.  Midline sternotomy IMPRESSION: 1. Mild interstitial edema pattern and pleural effusions. 2. Mild peripheral consolidation the upper lobes likely relates to interstitial edema and mild pulmonary edema. Infection felt less likely. 3. CABG anatomy. Electronically Signed   By: Genevive Bi  M.D.   On: 11/14/2015 15:51    Disposition   Pt is being discharged home today in good condition.  Follow-up Plans & Appointments    Follow-up Information    Follow up with [facility].   Why:  Home Health Registered Nurse, Physical Therapy and Occupational Therapy   Contact information:   [address] [place] [address] [phone]       Follow up with Irena Cords, MD On 12/04/2015.   Specialty:  Nephrology   Why:  Appointment time 12:30. You will have labs done 1 week prior to that.   Contact information:   [address] [place] [address] [phone]       Follow up with [name], PA-C On 11/22/2015.   Specialties:  Physician Assistant, Radiology, Interventional Cardiology   Why:  11:50 AM (Dr. Yevonne Pax PA)   Contact information:   [address] [place] [address] [phone]       Follow up with [facility] .   Why:  Outpatient Palliative Care   Contact information:  Hospice in [place], [place] Address: [address] [place], [place] [address] Phone: [phone]     Discharge Instructions    Diet - low sodium heart healthy    Complete by:  As directed      Discharge instructions    Complete by:  As directed   Weight today 101 lb. Patient instructed to weigh herself immediately upon returning home and every morning. If weight increased by >2lb in 1 day or 5 lb in one week, take lasix 80mg  in the am, 40mg  in the afternoon. She will need follow up in clinic within 1 week. At that time a BMP (lab work) should be checked.     Face-to-face encounter (required for Medicare/Medicaid patients)    Complete by:  As directed   I Bevan Disney certify that this patient is under my care and that I, or a nurse practitioner or physician's assistant working with me, had a face-to-face encounter that meets the physician face-to-face encounter requirements with this patient on 11/15/2015. The  encounter with the patient was in whole, or in part for the following medical condition(s) which is the primary reason for home health care (List medical condition): Systolic CHF  The encounter with the patient was in whole, or in part, for the following medical condition, which is the primary reason for home health care:  systolic CHF  I certify that, based on my findings, the following services are medically necessary home health services:   Nursing Physical therapy    Reason for Medically Necessary Home Health Services:  Skilled Nursing- Change/Decline in Patient Status  My clinical findings support the need for the above services:  Shortness of breath with activity  Further, I certify that my clinical findings support that this patient is homebound due to:  Shortness of Breath with activity     Home Health    Complete by:  As directed   To provide the following care/treatments:   PT OT RN       Increase activity slowly    Complete by:  As directed            Discharge Medications   Current Discharge Medication List    START taking these medications   Details  spironolactone (ALDACTONE) 25 MG tablet Take 0.5 tablets (12.5 mg total) by mouth daily. Qty: 30 tablet, Refills: 5      CONTINUE these medications which have CHANGED   Details  furosemide (LASIX) 40 MG tablet Take 1 tablet (40 mg total) by mouth 2 (two) times daily. Qty: 60 tablet, Refills: 5      CONTINUE these medications which have NOT CHANGED   Details  acetaminophen (TYLENOL) 500 MG tablet Take 1 tablet (500 mg total) by mouth every 6 (six) hours as needed. Mild pain.    ALPRAZolam (XANAX) 0.25 MG tablet Take 0.125-0.25 mg by mouth 4 (four) times daily - after meals and at bedtime. Takes 0.5 tablet by mouth three times a day and a whole tablet by mouth at bedtime    aspirin 81 MG tablet Take 81 mg by mouth daily.      carisoprodol (SOMA) 250 MG tablet Take 62.5 mg by mouth as needed (1/4 tab, muscle pain).     Associated Diagnoses: CAD (coronary artery disease); Hyponatremia; Pure hypercholesterolemia    Cholecalciferol (VITAMIN D3) 5000 units TABS Take 5,000 Units by mouth daily.    clopidogrel (PLAVIX) 75 MG tablet TAKE 1 TABLET BY MOUTH EVERY DAY Qty: 30  tablet, Refills: 0    diphenhydrAMINE (BENADRYL) 12.5 MG/5ML elixir Take 12.5 mg by mouth 4 (four) times daily as needed. itching    docusate sodium (COLACE) 100 MG capsule Take 100 mg by mouth 3 (three) times daily as needed (constipation).     ferrous sulfate 325 (65 FE) MG tablet Take 325 mg by mouth daily.    Multiple Vitamin (MULTIVITAMIN) capsule Take 1 capsule by mouth daily.    MYRBETRIQ 50 MG TB24 tablet Take 50 mg by mouth daily. Refills: 11   Associated Diagnoses: Urinary frequency    saccharomyces boulardii (FLORASTOR) 250 MG capsule Take 250 mg by mouth daily.    lactose free nutrition (BOOST PLUS) LIQD Take 237 mLs by mouth daily after lunch daily after lunch.    NITROSTAT 0.4 MG SL tablet PLACE 1 TABLET UNDER THE TONGUE EVERY 5 MINUTES AS NEEDED FOR CHEST PAIN Qty: 25 tablet, Refills: 1    pantoprazole (PROTONIX) 40 MG tablet Take 40 mg by mouth daily.      STOP taking these medications     sodium chloride 1 G tablet      benzonatate (TESSALON) 200 MG capsule      Cholecalciferol (VITAMIN D PO)          Aspirin prescribed at discharge?  Yes High Intensity Statin Prescribed? (Lipitor 40-80mg  or Crestor 20-40mg ): No: intolerant to statins Beta Blocker Prescribed? No: borderline hypotension For EF 45% or less, Was ACEI/ARB Prescribed? No: borderline hypotension ADP Receptor Inhibitor Prescribed? (i.e. Plavix etc.-Includes Medically Managed Patients): Yes For EF <40%, Aldosterone Inhibitor Prescribed? Yes Was EF assessed during THIS hospitalization? Yes Was Cardiac Rehab II ordered? (Included Medically managed Patients): No:    Outstanding Labs/Studies   BMP in 1 week at time of post hospital f/u  11/22/15  Duration of Discharge Encounter   Greater than 30 minutes including physician time.  Signed, Sharol Harness, Syaire Saber NP 11/15/2015, 1:00 PM

## 2015-11-15 NOTE — Progress Notes (Signed)
I visited with Sabrina Moore and her daughter again.  I reinforced a low sodium diet and high sodium foods to avoid.  She tells me that they have recommended a 1200 cc Fluid restriction.  She has concerns regarding eating at the "dining hall".  We discussed certain foods that she enjoys and their sodium content.  She will return to her independent living with a HHRN and follow-up with [facility].  I have encouraged her to call me after her hospital discharge with any questions or concerns regarding her HF.

## 2015-11-15 NOTE — Plan of Care (Signed)
Problem: Health Behavior/Discharge Planning: Goal: Ability to manage health-related needs will improve for discharge Outcome: Adequate for Discharge Patient voiced concern about CT results and wanted copy of radiology report. Given and reviewed with patient at bedside along with additional heart failure education. Patient for discharge tomorrow to ALF at [facility].  Questions and concerns answered to patient's satisfaction.  BP better since spironolactone discontinued: Filed Vitals:    11/14/15 1227 11/14/15 1404 11/14/15 2013 11/14/15 2100  BP:   94/45 110/56    Pulse: 91 89 92    Temp:   97.9 F (36.6 C) 98.3 F (36.8 C)    TempSrc:   Oral Oral    Resp:   16 16    Height:          Weight:          SpO2:   94% 95% 98%   Patient appears much better on assessment; states she is feeling much better also. Activity tolerance,health management and medication knowledge appropriate for discharge with home health services for heart failure.  Continuing to monitor.

## 2015-11-15 NOTE — Progress Notes (Signed)
Patient Profile: 82 y/o F with CAD s/p CABG and PCI, hypertension, hyperlipidemia, and chronic hyponatremia here with acute systolic heart failure and worsened hyponatremia.  Subjective: Patient notes significant improvement. No resting dyspnea. She did not require O2 last night while sleeping. Ambulating w/o difficulty. No chest pain.   Objective: Vital signs in last 24 hours: Temp:  [97.9 F (36.6 C)-98.8 F (37.1 C)] 98.8 F (37.1 C) (02/01 0549) Pulse Rate:  [81-92] 88 (02/01 0549) Resp:  [16] 16 (01/31 2013) BP: (94-113)/(45-63) 113/63 mmHg (02/01 0549) SpO2:  [94 %-98 %] 98 % (02/01 0549) Weight:  [101 lb 3.2 oz (45.904 kg)] 101 lb 3.2 oz (45.904 kg) (02/01 0549) Last BM Date: 11/13/15  Intake/Output from previous day: 01/31 0701 - 02/01 0700 In: 510 [P.O.:500; I.V.:10] Out: 1150 [Urine:1150] Intake/Output this shift:    Medications Current Facility-Administered Medications  Medication Dose Route Frequency Provider Last Rate Last Dose  . 0.9 %  sodium chloride infusion  250 mL Intravenous PRN Pricilla Riffle, MD      . acetaminophen (TYLENOL) tablet 650 mg  650 mg Oral Q4H PRN Pricilla Riffle, MD   650 mg at 11/10/15 0957  . ALPRAZolam Prudy Feeler) tablet 0.125 mg  0.125 mg Oral TID PRN Pricilla Riffle, MD   0.125 mg at 11/14/15 1017  . ALPRAZolam Prudy Feeler) tablet 0.25 mg  0.25 mg Oral QHS PRN Thurmon Fair, MD   0.25 mg at 11/14/15 2227  . aspirin EC tablet 81 mg  81 mg Oral Daily Pricilla Riffle, MD   81 mg at 11/14/15 0940  . benzonatate (TESSALON) capsule 200 mg  200 mg Oral TID PRN Pricilla Riffle, MD   200 mg at 11/10/15 1800  . cholecalciferol (VITAMIN D) tablet 400 Units  400 Units Oral BID Pricilla Riffle, MD   400 Units at 11/14/15 2226  . clopidogrel (PLAVIX) tablet 75 mg  75 mg Oral Daily Pricilla Riffle, MD   75 mg at 11/14/15 0939  . diphenhydrAMINE (BENADRYL) 12.5 MG/5ML elixir 12.5 mg  12.5 mg Oral Q6H PRN Pricilla Riffle, MD      . docusate sodium (COLACE) capsule 100 mg  100 mg  Oral TID PRN Pricilla Riffle, MD   100 mg at 11/14/15 2226  . enoxaparin (LOVENOX) injection 40 mg  40 mg Subcutaneous Q24H Pricilla Riffle, MD   40 mg at 11/14/15 0939  . ferrous sulfate tablet 325 mg  325 mg Oral Q breakfast Pricilla Riffle, MD   325 mg at 11/14/15 0740  . furosemide (LASIX) tablet 40 mg  40 mg Oral BID Chilton Si, MD   40 mg at 11/14/15 1730  . lactose free nutrition (BOOST PLUS) liquid 237 mL  237 mL Oral QPC lunch Dietrich Pates V, MD   237 mL at 11/10/15 1300  . mirabegron ER (MYRBETRIQ) tablet 50 mg  50 mg Oral Daily Pricilla Riffle, MD   50 mg at 11/14/15 0944  . multivitamin with minerals tablet 1 tablet  1 tablet Oral Daily Pricilla Riffle, MD   1 tablet at 11/14/15 [phone]  . ondansetron (ZOFRAN) injection 4 mg  4 mg Intravenous Q6H PRN Pricilla Riffle, MD      . pantoprazole (PROTONIX) EC tablet 40 mg  40 mg Oral Daily Pricilla Riffle, MD   40 mg at 11/14/15 0941  . polyethylene glycol (MIRALAX / GLYCOLAX) packet 17 g  17 g Oral Daily Judie Grieve  Otho Bellows, PA-C   17 g at 11/14/15 0936  . sodium chloride flush (NS) 0.9 % injection 3 mL  3 mL Intravenous Q12H Pricilla Riffle, MD   3 mL at 11/14/15 2227  . sodium chloride flush (NS) 0.9 % injection 3 mL  3 mL Intravenous PRN Pricilla Riffle, MD      . spironolactone (ALDACTONE) tablet 12.5 mg  12.5 mg Oral Daily Lyn Records, MD   12.5 mg at 11/14/15 0944    PE: General appearance: alert, cooperative and no distress Neck: no carotid bruit and no JVD Lungs: clear to auscultation bilaterally Heart: regular rate and rhythm, S1, S2 normal, no murmur, click, rub or gallop Extremities: no LEE Pulses: 2+ and symmetric Skin: warm and dry Neurologic: Grossly normal  Lab Results:  No results for input(s): WBC, HGB, HCT, PLT in the last 72 hours. BMET  Recent Labs  11/13/15 0526 11/14/15 0717 11/15/15 0540  NA 134* 132* 135  K 4.1 3.9 4.0  CL 94* 91* 91*  CO2 GLUCOSE 117* 117* 104*  BUN CREATININE 0.76 0.64 0.66  CALCIUM 9.0  8.9 9.1     Assessment/Plan  Principal Problem:   Acute systolic (congestive) heart failure (HCC) Active Problems:   Hyponatremia   S/P CABG (coronary artery bypass graft)   Hypotension   CHF (congestive heart failure) (HCC)   Palliative care encounter   Acute congestive heart failure (HCC)   1. Acute Systolic CHF: New this admission. EF now at 20-25% with mild-moderate MR. Patient appears euvolemic w/o pulmonic rales and no peripheral edema. No further dyspnea. New dry weight is 101 lb. Continue 40 mg of Lasix BID and spironolactone. No room for ACE or BB given soft BP/ issues with hypotension.   2. Hyponatremia: improved today, now WNL at 135. Thought to be due to SIADH +/- reset osmostat and acute systolic heart failure. Chest CT showed only mild interstitial edema and pleural effusions. Not suggestive of malignancy or infection.   3. CAD: No work up this hospitalization. She denies chest pain but does have a newly-depressed LVEF. She wishes to avoid any invasive procedures and wants to take palliative approach. Continue medical therapy with home ASA and Plavix. BB discontinued due to hypotension. She is also intolerant to statins.   Dispo: likely d/c home today.     LOS: 6 days    Jadee Golebiewski M. Delmer Islam 11/15/2015 8:33 AM

## 2015-11-15 NOTE — Progress Notes (Signed)
Nutrition Education Note  RD consulted for nutrition education regarding CHF. Patient states that she has been on a high sodium diet, taking 6 sodium tablets per day (6 gm sodium). She was admitted with fluid overload. Has now been switched to a low sodium diet with a 1200 ml fluid restriction. Per daughter, patient has been told by her Nephrologist that she needs increased protein intake. Plans for d/c back to facility today.  RD provided "Heart Failure Nutrition Therapy for the Undernourished" handout from the Academy of [employer]. Reviewed patient's dietary recall. Provided examples on ways to decrease sodium intake in diet. Discouraged intake of processed foods and use of salt shaker. Encouraged fresh fruits and vegetables as well as whole grain sources of carbohydrates to maximize fiber intake.   RD discussed why it is important for patient to adhere to diet recommendations, and emphasized the role of fluids, foods to avoid, and importance of weighing self daily. Teach back method used.  Expect good compliance.  Body mass index is 18.51 kg/(m^2). Pt meets criteria for normal weight based on current BMI.  Patient is being discharged today per discussion with daughter. Labs and medications reviewed. No further nutrition interventions warranted at this time. RD contact information provided. If additional nutrition issues arise, please re-consult RD.   Joaquin Courts, RD, LDN, CNSC Pager [phone] After Hours Pager [phone]

## 2015-11-15 NOTE — Progress Notes (Signed)
Pt and daughter had concerns about pt being discharged on aldactone. Spoke with Boyce Medici PA and stated ok to continue at d/c. Notified family. I educated if they felt like pt was not doing well on medication to follow up with cardiologist. Family and pt stated understanding. Emelda Brothers RN

## 2015-11-16 ENCOUNTER — Telehealth: Payer: Self-pay | Admitting: Cardiology

## 2015-11-16 NOTE — Telephone Encounter (Signed)
New Message:  [facility] called in wanting to get some Home Health orders for the pt. Please f/u with her

## 2015-11-16 NOTE — Telephone Encounter (Signed)
Spoke with HHN and ok for 1 visit this week, 2 x a week for 4 weeks, and 1 x a week for 4 weeks Ok per  [name]

## 2015-11-21 NOTE — Progress Notes (Signed)
Cardiology Office Note:    Date:  11/22/2015   ID:  Sabrina Moore, DOB [birth year], MRN [id number]  PCP:  Pearla Dubonnet, MD  Cardiologist:  Dr. Cassell Clement  >> Patient requests Dr. Chilton Si for FU (in light of Dr. Yevonne Pax retirement) Electrophysiologist:  n/a  Chief Complaint  Patient presents with  . Hospitalization Follow-up    Acute systolic CHF    History of Present Illness:     Sabrina Moore is a 82 y.o. female with a hx of CAD status post CABG in 1996 and subsequent stenting of the SVG-diagonal and SVG-intermediate in 2011, hyponatremia (evaluated by nephrology), HL (intolerant to statins), prior vasovagal syncope, HTN.  She was admitted from the office 1/26-2/1 with acute CHF and new LBBB. Echo demonstrated newly depressed LV function with an EF of 20-25%. Cardiac catheterization was recommended. However, the patient declined. Palliative approach was pursued. She was treated medically. HF treatment was limited by hypotension. She also had hyponatremia which is thought to be related to SIADH plus/minus reset osmostat that was worsened by acute CHF. Chest CT was negative for malignancy. Sodium improved to baseline at discharge. Discharge weight was 101. She was discharged on furosemide and spironolactone. Beta blocker and ACE inhibitor could not be prescribed a low blood pressure.  Returns for FU.  Here today with her daughter. She lives at [employer].  She remains quite fatigued. Eating is a struggle for her. She notes a lot of interscapular back pain that seems to occur with activity. She continues to sleep on a wedge. She denies PND or edema. Her cough is resolved. Her weight has decreased from 103.4 pounds at discharge to 98.8 pounds today (weights taken at her apartment). She denies syncope. She has been constipated. She continues to note dyspnea with minimal activity.   Past Medical History  Diagnosis Date  . CAD (coronary artery disease)     Stents  placed in 2012?0-on plavix and asa   . Dyslipidemia     Intolerant to statins  . [facility]  . IDA (iron deficiency anemia)     NO scopes recently? was controverial  . IBS (irritable bowel syndrome)     Seen by Dr. [name] Jan 2013-advised not to do this  . Anxiety   . Hypertension   . Hypercholesterolemia   . Shingles     End of august 2012-post-herpeticv Neuralgia    Past Surgical History  Procedure Laterality Date  . Coronary artery bypass graft  1996  . Cardiac catheterization  02/09/10  . Appendectomy  1967  . Tonsillectomy  1933  . Oophorectomy  1967  . Eye surgery  2009    catarac  . Squamous cell carcinoma excision  2012    from nose     Current Medications: Outpatient Prescriptions Prior to Visit  Medication Sig Dispense Refill  . acetaminophen (TYLENOL) 500 MG tablet Take 1 tablet (500 mg total) by mouth every 6 (six) hours as needed. Mild pain. (Patient taking differently: Take 500 mg by mouth every 6 (six) hours as needed for mild pain or headache. Mild pain.)    . ALPRAZolam (XANAX) 0.25 MG tablet Take 0.125-0.25 mg by mouth 4 (four) times daily - after meals and at bedtime. Takes 0.5 tablet by mouth three times a day and a whole tablet by mouth at bedtime    . aspirin 81 MG tablet Take 81 mg by mouth daily.      Marland Kitchen  carisoprodol (SOMA) 250 MG tablet Take 62.5 mg by mouth as needed (For muscle pain).     . Cholecalciferol (VITAMIN D3) 5000 units TABS Take 5,000 Units by mouth daily.    . clopidogrel (PLAVIX) 75 MG tablet TAKE 1 TABLET BY MOUTH EVERY DAY 30 tablet 0  . diphenhydrAMINE (BENADRYL) 12.5 MG/5ML elixir Take 12.5 mg by mouth 4 (four) times daily as needed. itching    . docusate sodium (COLACE) 100 MG capsule Take 100 mg by mouth 3 (three) times daily as needed (constipation).     . ferrous sulfate 325 (65 FE) MG tablet Take 325 mg by mouth daily.    . furosemide (LASIX) 40 MG tablet Take 1 tablet (40 mg total) by mouth 2 (two)  times daily. 60 tablet 5  . lactose free nutrition (BOOST PLUS) LIQD Take 237 mLs by mouth daily after lunch daily after lunch.    . Multiple Vitamin (MULTIVITAMIN) capsule Take 1 capsule by mouth daily.    Marland Kitchen MYRBETRIQ 50 MG TB24 tablet Take 50 mg by mouth daily.  11  . NITROSTAT 0.4 MG SL tablet PLACE 1 TABLET UNDER THE TONGUE EVERY 5 MINUTES AS NEEDED FOR CHEST PAIN 25 tablet 1  . pantoprazole (PROTONIX) 40 MG tablet Take 40 mg by mouth daily.    Marland Kitchen saccharomyces boulardii (FLORASTOR) 250 MG capsule Take 250 mg by mouth daily.    Marland Kitchen spironolactone (ALDACTONE) 25 MG tablet Take 0.5 tablets (12.5 mg total) by mouth daily. 30 tablet 5   No facility-administered medications prior to visit.     Allergies:   Amitriptyline; Avelox; Carisoprodol; Citalopram hydrobromide; Crestor; Erythromycin; Flexeril; Hydrocodone-acetaminophen; Lescol; Lescol; Levaquin; Lidocaine; Lipitor; Lovastatin; Macrobid; Novocain; Oxycodone; Pravachol; Procaine hcl; Promethazine hcl; Shellfish allergy; Wellbutrin; Zocor; and Zofran   Social History   Social History  . Marital Status: Widowed    Spouse Name: N/A  . Number of Children: N/A  . Years of Education: N/A   Social History Main Topics  . Smoking status: Never Smoker   . Smokeless tobacco: None  . Alcohol Use: No  . Drug Use: No  . Sexual Activity: Not Asked   Other Topics Concern  . None   Social History Narrative   Publishing rights manager for a bit.   [place] from [place]- [place] [id number] to this [place]      [place] [facility] (c) [phone]     Family History:  The patient's family history includes Arthritis in her father; Breast cancer in her mother; Cancer (age of onset: 65) in her mother; Heart disease in her father; Ovarian cancer in her mother.   ROS:   Please see the history of present illness.    Review of Systems  Constitution: Positive for decreased appetite, malaise/fatigue and weight loss.  Cardiovascular: Positive for dyspnea on exertion.  Respiratory:  Positive for cough and shortness of breath.   Gastrointestinal: Positive for constipation.  All other systems reviewed and are negative.   Physical Exam:    VS:  BP 100/50 mmHg  Pulse 88  Ht  (1.575 m)  Wt 100 lb 6.4 oz (45.541 kg)  BMI 18.36 kg/m2   GEN: Well nourished, well developed, in no acute distress HEENT: normal Neck: no JVD, no masses Cardiac: Normal S1/S2, RRR; no murmurs,  no edema;    Respiratory:  clear to auscultation bilaterally; no wheezing, rhonchi or rales GI: soft, nontender  MS: no deformity or atrophy Skin: warm and dry, no rash Neuro:   no focal  deficits  Psych: Alert and oriented x 3, normal affect  Wt Readings from Last 3 Encounters:  11/22/15 100 lb 6.4 oz (45.541 kg)  11/15/15 101 lb 3.2 oz (45.904 kg)  11/09/15 111 lb 6.4 oz (50.531 kg)      Studies/Labs Reviewed:     EKG:  EKG is  ordered today.  The ekg ordered today demonstrates NSR, HR 89, LBBB  Recent Labs: 11/09/2015: B Natriuretic Peptide 1231.2* 11/10/2015: ALT 31; Hemoglobin 12.7; Platelets 198; TSH 1.158 11/15/2015: BUN 13; Creatinine, Ser 0.66; Potassium 4.0; Sodium 135   Recent Lipid Panel    Component Value Date/Time   CHOL 180 05/09/2011 1001   TRIG 82.0 05/09/2011 1001   HDL 51.30 05/09/2011 1001   CHOLHDL 4 05/09/2011 1001   VLDL 16.4 05/09/2011 1001   LDLCALC 112* 05/09/2011 1001    Additional studies/ records that were reviewed today include:   Echo 11/10/15 EF 20-25%, lateral and inferolateral akinesis, grade 2 diastolic dysfunction, moderate MR directed eccentric lesion posteriorly Mild LAE, moderately reduced RVSF, mild to moderate TR, PASP 49 mmHg, left pleural effusion  Echo 5/13 EF 60-65%  Myoview 4/11 Moderate anterolateral ischemia EF 67%  LHC 4/11 LM okay LAD occluded RI ostial 90% LCx irregularities RCA occluded SVG-RCA occluded SVG-diagonal occluded SVG-RI 99% mid LIMA-LAD patent PCI: Promus DES to the SVG-Dx, Promus DES to the  SVG-RI  ASSESSMENT:     1. Chronic combined systolic and diastolic CHF (congestive heart failure) (HCC)   2. Coronary artery disease involving native coronary artery of native heart with angina pectoris (HCC)   3. Hyponatremia   4. Poor appetite     PLAN:     In order of problems listed above:  1. Chronic combined systolic and diastolic CHF - Probable ischemic cardiomyopathy.  She has declined cardiac catheterization. She is NYHA 3. Her low blood pressure limits heart failure medication titration. I considered placing her on Ivabridine. However, I am not convinced of much benefit with her. I reviewed this with [name] who agreed. We will continue her current medications. She is currently being seen by palliative care at [facility]. Obtain follow-up BMET today. If BUN and creatinine are increasing, I will decrease her dose of Lasix.  She remembers Dr. Duke Salvia from the hospital.  As [name] is retiring, she would like to FU with Dr. Duke Salvia.    2. CAD - As noted, she has declined cardiac catheterization given her advanced age and prior difficult PCI in 2011. She is currently being followed by palliative care. She is likely having chronic angina given her interscapular back pain. As noted, her low blood pressure limits medication adjustments. She is intolerant to statins. Continue aspirin, Plavix.  3. Hyponatremia - Follow-Up with nephrology as planned. Check BMET today.  4. Poor appetite - I suspect she is probably experiencing some cardiac cachexia. I have encouraged her to drink Ensure or Boost. She should review this with nephrology  She is currently on fluid restriction given her hyponatremia.    Medication Adjustments/Labs and Tests Ordered: Current medicines are reviewed at length with the patient today.  Concerns regarding medicines are outlined above.  Medication changes, Labs and Tests ordered today are outlined in the Patient Instructions noted below. Patient  Instructions  Medication Instructions:  Your physician recommends that you continue on your current medications as directed. Please refer to the Current Medication list given to you today.  Labwork: TODAY BMET  Testing/Procedures: NONE  Follow-Up: 6 WEEKS WITH DR. Elmarie Shiley  Kingsley  Any Other Special Instructions Will Be Listed Below (If Applicable).  If you need a refill on your cardiac medications before your next appointment, please call your pharmacy.     Signed, [name], PA-C  11/22/2015 1:09 PM    [facility] [address] [place], [place], [place]  [address] Phone: [phone]; Fax: [phone]

## 2015-11-22 ENCOUNTER — Encounter: Payer: Self-pay | Admitting: Physician Assistant

## 2015-11-22 ENCOUNTER — Ambulatory Visit (INDEPENDENT_AMBULATORY_CARE_PROVIDER_SITE_OTHER): Payer: Medicare Other | Admitting: Physician Assistant

## 2015-11-22 VITALS — BP 100/50 | HR 88 | Ht 62.0 in | Wt 100.4 lb

## 2015-11-22 DIAGNOSIS — I25119 Atherosclerotic heart disease of native coronary artery with unspecified angina pectoris: Secondary | ICD-10-CM

## 2015-11-22 DIAGNOSIS — I5022 Chronic systolic (congestive) heart failure: Secondary | ICD-10-CM

## 2015-11-22 DIAGNOSIS — E871 Hypo-osmolality and hyponatremia: Secondary | ICD-10-CM

## 2015-11-22 DIAGNOSIS — R630 Anorexia: Secondary | ICD-10-CM | POA: Diagnosis not present

## 2015-11-22 DIAGNOSIS — I5042 Chronic combined systolic (congestive) and diastolic (congestive) heart failure: Secondary | ICD-10-CM

## 2015-11-22 DIAGNOSIS — I259 Chronic ischemic heart disease, unspecified: Secondary | ICD-10-CM

## 2015-11-22 LAB — BASIC METABOLIC PANEL
BUN: 27 mg/dL — ABNORMAL HIGH (ref 7–25)
CALCIUM: 9.3 mg/dL (ref 8.6–10.4)
CO2: 32 mmol/L — AB (ref 20–31)
Chloride: 87 mmol/L — ABNORMAL LOW (ref 98–110)
Creat: 0.65 mg/dL (ref 0.60–0.88)
Glucose, Bld: 112 mg/dL — ABNORMAL HIGH (ref 65–99)
Potassium: 3.4 mmol/L — ABNORMAL LOW (ref 3.5–5.3)
SODIUM: 129 mmol/L — AB (ref 135–146)

## 2015-11-22 NOTE — Patient Instructions (Addendum)
Medication Instructions:  Your physician recommends that you continue on your current medications as directed. Please refer to the Current Medication list given to you today.  Labwork: TODAY BMET  Testing/Procedures: NONE  Follow-Up: 6 WEEKS WITH DR. Elmarie Shiley Crawford  Any Other Special Instructions Will Be Listed Below (If Applicable).  If you need a refill on your cardiac medications before your next appointment, please call your pharmacy.

## 2015-11-23 ENCOUNTER — Telehealth: Payer: Self-pay | Admitting: *Deleted

## 2015-11-23 MED ORDER — FUROSEMIDE 40 MG PO TABS: 40.0000 mg | ORAL_TABLET | ORAL | Status: AC

## 2015-11-23 NOTE — Telephone Encounter (Signed)
DPR on file for daughter Sabrina Moore who has been notified of lab results and dose changes with lasix 40 am/20 mg pm. Sabrina Moore stated Nephrologist ordered labs for 2/13 as well. I stated that I would be sure it is most likely a bmet which is what PA ordered as well. I stated no need to duplicate lab order. I did advise if for some reason pt is not getting BMET 2/13 please let me know 2/10 so that we can schedule BMET. Melanie verbalized understanding plan of care.

## 2015-11-27 ENCOUNTER — Telehealth: Payer: Self-pay | Admitting: *Deleted

## 2015-11-27 ENCOUNTER — Encounter: Payer: Self-pay | Admitting: Physician Assistant

## 2015-11-27 ENCOUNTER — Telehealth: Payer: Self-pay | Admitting: Physician Assistant

## 2015-11-27 NOTE — Telephone Encounter (Signed)
DPR on file for daughter Shawna Orleans who has been advised of lab results fro pt by phone with verbal understanding.

## 2015-11-27 NOTE — Telephone Encounter (Signed)
I called and s/w Tammy at [facility] per her request to let he know that we did get the lab results. I advised once PA reviews labs I will call if there are any changes to be made. Tammy thanked me for my help.

## 2015-11-27 NOTE — Telephone Encounter (Signed)
Spoke with Tammy at [facility] and she states that [facility], PA-C had wanted pt to have a BMET repeated today. She states that she has completed this and has been trying to fax them over but keeps getting a busy signal. Provided Tammy with a different fax number to try.  Tammy asked that once labs are received to please call her and let her know that we got them.

## 2015-12-07 ENCOUNTER — Telehealth: Payer: Self-pay | Admitting: Cardiovascular Disease

## 2015-12-07 NOTE — Telephone Encounter (Signed)
Received records from [facility] for appointment on 12/28/15 with Dr Duke Salvia.  Records given to [facility] (medical records) for Dr Leonides Sake schedule on 12/28/15. lp

## 2015-12-13 ENCOUNTER — Encounter: Payer: Self-pay | Admitting: Physician Assistant

## 2015-12-13 ENCOUNTER — Telehealth: Payer: Self-pay | Admitting: *Deleted

## 2015-12-13 NOTE — Telephone Encounter (Signed)
Pt daughter, DPR on file, notified of lab results by phone with verbal understanding.

## 2015-12-20 ENCOUNTER — Ambulatory Visit: Payer: Medicare Other | Admitting: Cardiovascular Disease

## 2015-12-28 ENCOUNTER — Encounter: Payer: Self-pay | Admitting: Cardiovascular Disease

## 2015-12-28 ENCOUNTER — Ambulatory Visit (INDEPENDENT_AMBULATORY_CARE_PROVIDER_SITE_OTHER): Payer: Medicare Other | Admitting: Cardiovascular Disease

## 2015-12-28 VITALS — BP 100/54 | HR 88 | Ht 61.5 in | Wt 99.8 lb

## 2015-12-28 DIAGNOSIS — I5042 Chronic combined systolic (congestive) and diastolic (congestive) heart failure: Secondary | ICD-10-CM

## 2015-12-28 DIAGNOSIS — I259 Chronic ischemic heart disease, unspecified: Secondary | ICD-10-CM | POA: Diagnosis not present

## 2015-12-28 DIAGNOSIS — I2510 Atherosclerotic heart disease of native coronary artery without angina pectoris: Secondary | ICD-10-CM | POA: Diagnosis not present

## 2015-12-28 DIAGNOSIS — R64 Cachexia: Secondary | ICD-10-CM | POA: Diagnosis not present

## 2015-12-28 DIAGNOSIS — E871 Hypo-osmolality and hyponatremia: Secondary | ICD-10-CM

## 2015-12-28 DIAGNOSIS — I2583 Coronary atherosclerosis due to lipid rich plaque: Secondary | ICD-10-CM

## 2015-12-28 NOTE — Patient Instructions (Addendum)
Medication Instructions:  TAKE AN EXTRA 1/2 FUROSEMIDE (TOTAL OF 40 MG)TONIGHT THEN RESUME YOUR REGULAR DOSE TOMORROW  Labwork: [facility] lab on [facility] 1st floor  Testing/Procedures: none  Follow-Up: Your physician recommends that you schedule a follow-up appointment in: 2 month ov  If you need a refill on your cardiac medications before your next appointment, please call your pharmacy.

## 2015-12-28 NOTE — Progress Notes (Signed)
Cardiology Office Note   Date:  12/28/2015   ID:  Sabrina Moore, DOB October 21, 1926, MRN [id number]  PCP:  Pearla Dubonnet, MD  Cardiologist:   Madilyn Hook, MD   Chief Complaint  Patient presents with  . Congestive Heart Failure      History of Present Illness: Sabrina Moore is a 82 y.o. female with hypertension, vasovagal syncope, LBBB, hyperlipidemia, and CAD s/p CABG in 1996 and PCI of SVG-->D and SVG-->RI in 2011 who presents for follow up.  [name] was previously a patient of [name] and was admitted from the office 1/20 622/1 with acute heart failure and new left bundle branch block. Her echo showed a newly depressed ejection fraction of 20-25%. Cardiac cath was recommended but she declined. She instead pursued a palliative approach and was treated medically for heart failure, though this was limited by hypotension and hyponatremia. Discharge weight was 101 pounds. Beta blocker and ACE inhibitor were not given due to hypotension. She followed up with [name] on 11/21/15. At that appointment her main complaints were fatigue and dyspnea. Her weight had decreased to 98.8 pounds. A basic metabolic panel was checked and her lasix was changed to 40 mg in the am  in the afternoon.  Spironolactone was stopped when she saw Dr. [name] on 2/20 due to dizziness and low BP.  Overall [name] has been doing well.  She has gradually been increasing her physical activity and can now walk to the dining room for dinner without stopping.  When she was discharged from the hospital she did not think she would never be able to do that again. She has not noted any lower extremity edema and she denies orthopnea, though she sleeps with the head of her bed elevated. This is partially due to shortness of breath but also because it more comfortable for her back. Yesterday she had some weakness, dizziness, and nausea. She also noted some mild confusion. This  seems to have improved today. She continues to weigh herself daily and her weight has been stable. Yesterday she was up 1 pound but today she is back to her baseline. She has been trying to gain weight quite drinking whey protein and Boost. Thus far she has been unsuccessful.   Past Medical History  Diagnosis Date  . CAD (coronary artery disease)     Stents placed in 2012?0-on plavix and asa   . Dyslipidemia     Intolerant to statins  . [facility]     [facility]  . IDA (iron deficiency anemia)     NO scopes recently? was controverial  . IBS (irritable bowel syndrome)     Seen by Dr. [name] Jan 2013-advised not to do this  . Anxiety   . Hypertension   . Hypercholesterolemia   . Shingles     End of august 2012-post-herpeticv Neuralgia    Past Surgical History  Procedure Laterality Date  . Coronary artery bypass graft  1996  . Cardiac catheterization  02/09/10  . Appendectomy  1967  . Tonsillectomy  1933  . Oophorectomy  1967  . Eye surgery  2009    catarac  . Squamous cell carcinoma excision  2012    from nose      Current Outpatient Prescriptions  Medication Sig Dispense Refill  . acetaminophen (TYLENOL) 500 MG tablet Take 1 tablet (500 mg total) by mouth every 6 (six) hours as needed. Mild pain. (Patient taking differently: Take 500  mg by mouth every 6 (six) hours as needed for mild pain or headache. Mild pain.)    . ALPRAZolam (XANAX) 0.25 MG tablet Take 0.125-0.25 mg by mouth 4 (four) times daily - after meals and at bedtime. Takes 0.5 tablet by mouth three times a day and a whole tablet by mouth at bedtime    . aspirin 81 MG tablet Take 81 mg by mouth daily.      . carisoprodol (SOMA) 250 MG tablet Take 62.5 mg by mouth as needed (For muscle pain).     . Cholecalciferol (VITAMIN D3) 5000 units TABS Take 5,000 Units by mouth daily.    . clopidogrel (PLAVIX) 75 MG tablet TAKE 1 TABLET BY MOUTH EVERY DAY 30 tablet 0  . diphenhydrAMINE (BENADRYL) 12.5 MG/5ML  elixir Take 12.5 mg by mouth 4 (four) times daily as needed. itching    . docusate sodium (COLACE) 100 MG capsule Take 100 mg by mouth 3 (three) times daily as needed (constipation).     . ferrous sulfate 325 (65 FE) MG tablet Take 325 mg by mouth daily.    . furosemide (LASIX) 40 MG tablet Take 1 tablet (40 mg total) by mouth as directed. 40 mg AM; 20 mg PM    . lactose free nutrition (BOOST PLUS) LIQD Take 237 mLs by mouth daily after lunch daily after lunch.    . Multiple Vitamin (MULTIVITAMIN) capsule Take 1 capsule by mouth daily.    Marland Kitchen MYRBETRIQ 50 MG TB24 tablet Take 50 mg by mouth daily.  11  . NITROSTAT 0.4 MG SL tablet PLACE 1 TABLET UNDER THE TONGUE EVERY 5 MINUTES AS NEEDED FOR CHEST PAIN 25 tablet 1  . pantoprazole (PROTONIX) 40 MG tablet Take 40 mg by mouth daily.    Marland Kitchen saccharomyces boulardii (FLORASTOR) 250 MG capsule Take 250 mg by mouth daily.     No current facility-administered medications for this visit.    Allergies:   Amitriptyline; Avelox; Carisoprodol; Citalopram hydrobromide; Crestor; Erythromycin; Flexeril; Hydrocodone-acetaminophen; Lescol; Lescol; Levaquin; Lidocaine; Lipitor; Lovastatin; Macrobid; Novocain; Oxycodone; Pravachol; Procaine hcl; Promethazine hcl; Shellfish allergy; Wellbutrin; Zocor; and Zofran    Social History:  The patient  reports that she has never smoked. She does not have any smokeless tobacco history on file. She reports that she does not drink alcohol or use illicit drugs.   Family History:  The patient's family history includes Arthritis in her father; Breast cancer in her mother; Cancer (age of onset: 23) in her mother; Heart disease in her father; Ovarian cancer in her mother.    ROS:  Please see the history of present illness.   Otherwise, review of systems are positive for none.   All other systems are reviewed and negative.    PHYSICAL EXAM: VS:  BP 100/54 mmHg  Pulse 88  Ht 5' 1.5" (1.562 m)  Wt 45.269 kg (99 lb 12.8 oz)  BMI  18.55 kg/m2 , BMI Body mass index is 18.55 kg/(m^2). GENERAL:  Well appearing HEENT:  Pupils equal round and reactive, fundi not visualized, oral mucosa unremarkable NECK:  No jugular venous distention, waveform within normal limits, carotid upstroke brisk and symmetric, no bruits, no thyromegaly LYMPHATICS:  No cervical adenopathy LUNGS:  Faint crackles at the L base HEART:  RRR.  PMI not displaced or sustained,S1 and S2 within normal limits, no S3, no S4, no clicks, no rubs, no murmurs ABD:  Flat, positive bowel sounds normal in frequency in pitch, no bruits, no rebound, no guarding, no  midline pulsatile mass, no hepatomegaly, no splenomegaly EXT:  2 plus pulses throughout, no edema, no cyanosis no clubbing SKIN:  No rashes no nodules NEURO:  Cranial nerves II through XII grossly intact, motor grossly intact throughout PSYCH:  Cognitively intact, oriented to person place and time   EKG:  EKG is not ordered today.   Recent Labs: 11/09/2015: B Natriuretic Peptide 1231.2* 11/10/2015: ALT 31; Hemoglobin 12.7; Platelets 198; TSH 1.158 11/22/2015: BUN 27*; Creat 0.65; Potassium 3.4*; Sodium 129*    Lipid Panel    Component Value Date/Time   CHOL 180 05/09/2011 1001   TRIG 82.0 05/09/2011 1001   HDL 51.30 05/09/2011 1001   CHOLHDL 4 05/09/2011 1001   VLDL 16.4 05/09/2011 1001   LDLCALC 112* 05/09/2011 1001      Wt Readings from Last 3 Encounters:  12/28/15 45.269 kg (99 lb 12.8 oz)  11/22/15 45.541 kg (100 lb 6.4 oz)  11/15/15 45.904 kg (101 lb 3.2 oz)      ASSESSMENT AND PLAN:  # Chronic systolic and diastolic heart failure:  # Hyponatremia: Overall she is doing much better. Today she has infrequent crackles and her weight was slightly elevated yesterday. Instead of 20 mg of Lasix this evening she will use 40 mg and then resume her 40 in the morning 20 mg in the evening regimen tomorrow. We will also check a sig metabolic panel to ensure that her hyponatremia is not worsening.   BP is too low for ACE-I or beta blockers.  # CAD: Ms. Patterson HammersmithBodie is not having any chest pain and her shortness of breath is improving.  Work up was deferred and we are managing medically, as she prefers a palliative approach.  Continue aspirin and plavix.  BP is too low for ACE-I or beta blockers.  # Cardiac cachexia: Continue Boost and Whey protein.  Current medicines are reviewed at length with the patient today.  The patient does not have concerns regarding medicines.  The following changes have been made:  no change  Labs/ tests ordered today include:   Orders Placed This Encounter  Procedures  . Basic metabolic panel     Disposition:   FU with Danner Paulding C. Duke Salviaandolph, MD, [facility] in 2 months.      This note was written with the assistance of speech recognition software.  Please excuse any transcriptional errors.  Signed, Sota Hetz C. Duke Salviaandolph, MD, [facility]  12/28/2015 1:16 PM    [facility]

## 2015-12-29 LAB — BASIC METABOLIC PANEL
BUN: 27 mg/dL — ABNORMAL HIGH (ref 7–25)
CALCIUM: 9.2 mg/dL (ref 8.6–10.4)
CO2: 31 mmol/L (ref 20–31)
CREATININE: 0.68 mg/dL (ref 0.60–0.88)
Chloride: 95 mmol/L — ABNORMAL LOW (ref 98–110)
GLUCOSE: 112 mg/dL — AB (ref 65–99)
Potassium: 4.3 mmol/L (ref 3.5–5.3)
SODIUM: 134 mmol/L — AB (ref 135–146)

## 2016-01-22 ENCOUNTER — Telehealth: Payer: Self-pay | Admitting: Cardiovascular Disease

## 2016-01-22 NOTE — Telephone Encounter (Signed)
New message      Pt is having more symptoms; talk about medication; family want to switch pt to hospice

## 2016-01-22 NOTE — Telephone Encounter (Signed)
Pt in home palliative care program. Tammy recounts past couple months since hospitalization. Saw Dr. Duke Salviaandolph recently and was doing well post-hospital.  RN notes pt declining over past couple weeks. Fatigue, SOB at rest.  Family has discussed switching over to hospice program.  Several adjustments on lasix to address SOB and fluid gains. Has had 1 day re-dosings. Currently on 40mg  AM 20mg  PM w/ short term adjustments as warranted by primary care.  Chronic hyponatremia, kidney insufficiency followed by Dr. Abel Prestoolodonato Na 133 "pretty good for her" BUN 26 Creat 0.55 on 26th. Post-hosp.   Tammy called to "keep us in the loop" as well as ask if anything advised. They are waiting on primary care to make decision on hospice admission. Advised to go w/ PCP recommendations. Will route to Dr. Duke Salviaandolph for her awareness. Made HHRN aware that Dr Duke Salviaandolph is out of office this week.

## 2016-01-25 ENCOUNTER — Other Ambulatory Visit: Payer: Self-pay | Admitting: *Deleted

## 2016-01-25 MED ORDER — NITROGLYCERIN 0.4 MG SL SUBL: SUBLINGUAL_TABLET | SUBLINGUAL | Status: AC

## 2016-01-28 NOTE — Telephone Encounter (Signed)
I'm so sorry to hear this.  They could try torsemide 20 mg bid if she is gaining weight and retaining fluid on lasix.  I'd be happy to see her if she feels up to coming to the office.

## 2016-01-29 NOTE — Telephone Encounter (Signed)
Spoke with Tammy and she is no longer nurse taking care of patient since transition to Hospice She will get a message to [facility]tephanie Hospice nurse to call and update after visit today

## 2016-01-29 NOTE — Telephone Encounter (Signed)
[facility]  She has been having pain in her chest, shoulder, nausea, and constipation.  Constipation better but still have pain and nausea. Morphine started last week for pain Eating and drinking very little Nurse feels like patient not retaining fluid and that she is leaning towards the dehydrated side Patient is being moved to Hospice facility for pain management and end of life care

## 2016-02-12 DEATH — deceased

## 2016-02-20 ENCOUNTER — Ambulatory Visit: Payer: Medicare Other | Admitting: Cardiovascular Disease
